# Patient Record
Sex: Female | Born: 1960 | Race: Black or African American | Hispanic: No | Smoking: Current every day smoker
Health system: Southern US, Community
[De-identification: ages and names within clinical notes are randomized; demographics above are authoritative.]

## PROBLEM LIST (undated history)

## (undated) DIAGNOSIS — I10 Essential (primary) hypertension: Secondary | ICD-10-CM

## (undated) DIAGNOSIS — R569 Unspecified convulsions: Secondary | ICD-10-CM

## (undated) DIAGNOSIS — E119 Type 2 diabetes mellitus without complications: Secondary | ICD-10-CM

## (undated) HISTORY — PX: APPENDECTOMY: SHX54

## (undated) HISTORY — DX: Unspecified convulsions: R56.9

## (undated) HISTORY — PX: ABDOMINAL HYSTERECTOMY: SHX81

## (undated) HISTORY — PX: CHOLECYSTECTOMY: SHX55

## (undated) HISTORY — DX: Essential (primary) hypertension: I10

## (undated) HISTORY — DX: Type 2 diabetes mellitus without complications: E11.9

---

## 1998-09-19 ENCOUNTER — Emergency Department (HOSPITAL_COMMUNITY): Admission: EM | Admit: 1998-09-19 | Discharge: 1998-09-19 | Payer: Self-pay | Admitting: Emergency Medicine

## 2012-07-27 ENCOUNTER — Other Ambulatory Visit: Payer: Self-pay

## 2012-07-27 MED ORDER — AMLODIPINE BESYLATE 5 MG PO TABS: 5.0000 mg | ORAL_TABLET | Freq: Every day | ORAL | Status: DC

## 2012-07-27 NOTE — Telephone Encounter (Signed)
Last seen 5/13

## 2012-07-29 ENCOUNTER — Telehealth: Payer: Self-pay | Admitting: Nurse Practitioner

## 2012-07-29 NOTE — Telephone Encounter (Signed)
PT AWARE RX CALLED IN

## 2012-08-30 ENCOUNTER — Other Ambulatory Visit: Payer: Self-pay | Admitting: *Deleted

## 2012-08-30 MED ORDER — AMLODIPINE BESYLATE 5 MG PO TABS: 5.0000 mg | ORAL_TABLET | Freq: Every day | ORAL | Status: DC

## 2012-08-30 NOTE — Telephone Encounter (Signed)
LAST OV 5/13. NTBS

## 2012-09-02 ENCOUNTER — Other Ambulatory Visit: Payer: Self-pay | Admitting: Nurse Practitioner

## 2012-09-22 ENCOUNTER — Telehealth: Payer: Self-pay | Admitting: *Deleted

## 2012-09-22 ENCOUNTER — Ambulatory Visit (INDEPENDENT_AMBULATORY_CARE_PROVIDER_SITE_OTHER): Payer: BC Managed Care – PPO | Admitting: Nurse Practitioner

## 2012-09-22 ENCOUNTER — Encounter: Payer: Self-pay | Admitting: Nurse Practitioner

## 2012-09-22 VITALS — BP 148/94 | HR 82 | Temp 98.0°F | Ht 63.0 in | Wt 199.0 lb

## 2012-09-22 DIAGNOSIS — I10 Essential (primary) hypertension: Secondary | ICD-10-CM

## 2012-09-22 DIAGNOSIS — B372 Candidiasis of skin and nail: Secondary | ICD-10-CM

## 2012-09-22 DIAGNOSIS — R569 Unspecified convulsions: Secondary | ICD-10-CM

## 2012-09-22 LAB — COMPLETE METABOLIC PANEL WITH GFR
ALT: 8 U/L (ref 0–35)
BUN: 15 mg/dL (ref 6–23)
CO2: 25 mEq/L (ref 19–32)
Calcium: 9 mg/dL (ref 8.4–10.5)
Chloride: 105 mEq/L (ref 96–112)
Creat: 0.77 mg/dL (ref 0.50–1.10)
GFR, Est African American: >89 mL/min

## 2012-09-22 MED ORDER — AMLODIPINE BESYLATE 5 MG PO TABS: 5.0000 mg | ORAL_TABLET | Freq: Every day | ORAL | Status: DC

## 2012-09-22 MED ORDER — MICONAZOLE NITRATE 2 % VA CREA: 1.0000 | TOPICAL_CREAM | Freq: Every day | VAGINAL | Status: DC

## 2012-09-22 NOTE — Patient Instructions (Signed)
Yeast Infection of the Skin Some yeast on the skin is normal, but sometimes it causes an infection. If you have a yeast infection, it shows up as white or light brown patches on brown skin. You can see it better in the summer on tan skin. It causes light-colored holes in your suntan. It can happen on any area of the body. This cannot be passed from person to person. HOME CARE  Scrub your skin daily with a dandruff shampoo. Your rash may take a couple weeks to get well.  Do not scratch or itch the rash. GET HELP RIGHT AWAY IF:   You get another infection from scratching. The skin may get warm, red, and may ooze fluid.  The infection does not seem to be getting better. MAKE SURE YOU:  Understand these instructions.  Will watch your condition.  Will get help right away if you are not doing well or get worse. Document Released: 02/21/2008 Document Revised: 06/02/2011 Document Reviewed: 02/21/2008 Exit[employer] Patient Information 2014 [employer].  

## 2012-09-22 NOTE — Telephone Encounter (Signed)
Pt not answering phone. Called to [facility] vm,  Monistat 7, 2% vaginal cream, place applicator full vaginally at bedtime for 7 nights.

## 2012-09-22 NOTE — Progress Notes (Signed)
  Subjective:    Patient ID: Melanie Maynard, female    DOB: 08/16/1960, 52 y.o.   MRN: [id number]  Hypertension This is a chronic problem. The current episode started more than 1 year ago. The problem has been waxing and waning (hasn't taken blood pressure meds today) since onset. The problem is uncontrolled. Pertinent negatives include no blurred vision, chest pain, headaches, malaise/fatigue, orthopnea, palpitations, peripheral edema, PND, shortness of breath or sweats. There are no associated agents to hypertension. Risk factors for coronary artery disease include obesity and post-menopausal state. Past treatments include calcium channel blockers. The current treatment provides moderate improvement. Compliance problems include diet and exercise.    Seizure disorder Sees Dr. Darrol Angel- Only goes 1X per year-Last seizure was at least 12 years ago.- Doing well on meds without side effects   Review of Systems  Constitutional: Negative for malaise/fatigue.  Eyes: Negative for blurred vision.  Respiratory: Negative for shortness of breath.   Cardiovascular: Negative for chest pain, palpitations, orthopnea and PND.  Neurological: Negative for headaches.  All other systems reviewed and are negative.       Objective:   Physical Exam  Constitutional: She is oriented to person, place, and time. She appears well-developed and well-nourished.  HENT:  Nose: Nose normal.  Mouth/Throat: Oropharynx is clear and moist.  Eyes: EOM are normal.  Neck: Trachea normal, normal range of motion and full passive range of motion without pain. Neck supple. No JVD present. Carotid bruit is not present. No thyromegaly present.  Cardiovascular: Normal rate, regular rhythm, normal heart sounds and intact distal pulses.  Exam reveals no gallop and no friction rub.   No murmur heard. Pulmonary/Chest: Effort normal and breath sounds normal.  Abdominal: Soft. Bowel sounds are normal. She exhibits no distension and no  mass. There is no tenderness.  Musculoskeletal: Normal range of motion.  Lymphadenopathy:    She has no cervical adenopathy.  Neurological: She is alert and oriented to person, place, and time. She has normal reflexes.  Skin: Skin is warm and dry.  Psychiatric: She has a normal mood and affect. Her behavior is normal. Judgment and thought content normal.   BP 148/94  Pulse 82  Temp(Src) 98 F (36.7 C) (Oral)  Ht 5\' 3"  (1.6 m)  Wt 199 lb (90.266 kg)  BMI 35.26 kg/m2        Assessment & Plan:   1. Hypertension   2. Seizures   3. Cutaneous candidiasis    Orders Placed This Encounter  Procedures  . COMPLETE METABOLIC PANEL WITH GFR  . NMR Lipoprofile with Lipids   Meds ordered this encounter  Medications  . phenytoin (DILANTIN) 100 MG ER capsule    Sig: Take by mouth 3 (three) times daily.  Marland Kitchen amLODipine (NORVASC) 5 MG tablet    Sig: Take 1 tablet (5 mg total) by mouth daily.    Dispense:  30 tablet    Refill:  5  . miconazole (MONISTAT 7) 2 % vaginal cream    Sig: Place 7 Applicatorfuls vaginally at bedtime.    Dispense:  45 g    Refill:  1    Order Specific Question:  Supervising Provider    Answer:  Deborra Medina   Continue current meds Labs pending Diet and exercise encouraged  Follow up in 6 months  Mary-Margaret Daphine Deutscher, FNP

## 2012-09-23 LAB — NMR LIPOPROFILE WITH LIPIDS
HDL Particle Number: 36.1 umol/L (ref >=30.5)
LDL (calc): 151 mg/dL — ABNORMAL HIGH (ref <100)
LDL Size: 20.4 nm — ABNORMAL LOW (ref >20.5)
LP-IR Score: 52 — ABNORMAL HIGH (ref <=45)
Large VLDL-P: 1.9 nmol/L (ref <=2.7)
Small LDL Particle Number: 1048 nmol/L — ABNORMAL HIGH (ref <=527)

## 2012-09-30 ENCOUNTER — Telehealth: Payer: Self-pay | Admitting: Nurse Practitioner

## 2012-09-30 MED ORDER — ATORVASTATIN CALCIUM 40 MG PO TABS: 40.0000 mg | ORAL_TABLET | Freq: Every day | ORAL | Status: DC

## 2012-09-30 NOTE — Telephone Encounter (Signed)
lipitor rx sent to pharmacy 

## 2012-10-05 ENCOUNTER — Encounter: Payer: Self-pay | Admitting: Nurse Practitioner

## 2012-10-05 ENCOUNTER — Other Ambulatory Visit: Payer: Self-pay

## 2012-10-05 ENCOUNTER — Ambulatory Visit (INDEPENDENT_AMBULATORY_CARE_PROVIDER_SITE_OTHER): Payer: BC Managed Care – PPO | Admitting: Nurse Practitioner

## 2012-10-05 VITALS — BP 140/80 | HR 84 | Ht 65.0 in | Wt 202.0 lb

## 2012-10-05 DIAGNOSIS — R569 Unspecified convulsions: Secondary | ICD-10-CM

## 2012-10-05 DIAGNOSIS — Z79899 Other long term (current) drug therapy: Secondary | ICD-10-CM

## 2012-10-05 MED ORDER — PHENYTOIN SODIUM EXTENDED 100 MG PO CAPS: 300.0000 mg | ORAL_CAPSULE | Freq: Every day | ORAL | Status: DC

## 2012-10-05 NOTE — Progress Notes (Signed)
I have read the note, and I agree with the clinical assessment and plan.  

## 2012-10-05 NOTE — Progress Notes (Signed)
HPI: Melanie Maynard, 52 year old black female returns for followup. She was last seen 10/09/2011. She has a history of seizure disorder, last seizure was approximately 18years ago. She is currently on Dilantin without side effects, however did run out of her prescription 2 days ago. She denies daytime drowsiness or feelings of being off balance. She has had no evidence of bowel or bladder incontinence, no tongue biting, no jerking of extremities. Sleeping well, appetite is reportedly good, has chronic sinus issues with cough. Medical history also includes  hypertension and elevated cholesterol. No new neurologic complaints.   ROS: Negative   Physical Exam General: well developed, well nourished, seated, in no evident distress Head: head normocephalic and atraumatic. Oropharynx benign Neck: supple with no carotid  bruits Cardiovascular: regular rate and rhythm, no murmurs  Neurologic Exam Mental Status: Awake and fully alert. Oriented to place and time. Follows all commands. Speech and language normal.   Cranial Nerves: Pupils equal, briskly reactive to light. Extraocular movements full without nystagmus. Visual fields full to confrontation. Hearing intact and symmetric to finger snap. Facial sensation intact. Face, tongue, palate move normally and symmetrically. Neck flexion and extension normal.  Motor: Normal bulk and tone. Normal strength in all tested extremity muscles.No focal weakness Coordination: Rapid alternating movements normal in all extremities. Finger-to-nose and heel-to-shin performed accurately bilaterally. No dysmetria Gait and Station: Arises from chair without difficulty. Stance is normal. Gait demonstrates normal stride length and balance . Able to heel, toe and tandem walk without difficulty.  Reflexes: 1+ and symmetric. Toes downgoing.     ASSESSMENT: Seizure disorder well controlled on Dilantin     PLAN: Pt will continue Dilantin at current dose will refill Will obtain  Dilantin  level next week patient has not taken medication for 2 days due to being out Review of labs from her primary care provider Will also obtain CBC Followup yearly and when necessary  Nilda Riggs, GNP-BC APRN

## 2012-10-05 NOTE — Patient Instructions (Addendum)
Pt will continue Dilantin at current dose will refill Will obtain Dilantin  level next week patient has not taken medication for 2 days due to being out Review of labs from her primary care provider Will also obtain CBC Followup yearly and when necessary

## 2012-10-14 ENCOUNTER — Other Ambulatory Visit (INDEPENDENT_AMBULATORY_CARE_PROVIDER_SITE_OTHER): Payer: BC Managed Care – PPO

## 2012-10-14 DIAGNOSIS — Z79899 Other long term (current) drug therapy: Secondary | ICD-10-CM

## 2012-10-14 DIAGNOSIS — R569 Unspecified convulsions: Secondary | ICD-10-CM

## 2012-10-18 LAB — CBC WITH DIFFERENTIAL/PLATELET
Basophils Absolute: 0 10*3/uL (ref 0.0–0.2)
Eosinophils Absolute: 0.4 10*3/uL (ref 0.0–0.4)
Immature Grans (Abs): 0 10*3/uL (ref 0.0–0.1)
Immature Granulocytes: 0 % (ref 0–2)
MCH: 26.8 pg (ref 26.6–33.0)
MCHC: 32.2 g/dL (ref 31.5–35.7)
MCV: 83 fL (ref 79–97)
Monocytes Absolute: 0.5 10*3/uL (ref 0.1–0.9)
Neutrophils Relative %: 58 % (ref 40–74)
RDW: 14.3 % (ref 12.3–15.4)

## 2012-10-20 NOTE — Telephone Encounter (Signed)
No response from pt.

## 2012-10-21 ENCOUNTER — Telehealth: Payer: Self-pay | Admitting: Neurology

## 2012-10-21 NOTE — Telephone Encounter (Signed)
Message copied by Stephanie Acre on Thu Oct 21, 2012  2:13 PM ------      Message from: [place], [place] T      Created: Thu Oct 21, 2012  1:02 PM       Dr. Anne Hahn can you look at these labs since Eber Jones is away, and put in result note.  I will call the patient.  Thanks Lupita Leash            ----- Message -----         From: [facility] Lab Results In Interface         Sent: 10/18/2012   1:35 PM           To: Nilda Riggs, NP                   ------

## 2012-10-21 NOTE — Telephone Encounter (Signed)
Called patient. A Dilantin level is low, but he tends to run low, and her seizures are under good control. I will not readjust the medication dosing. The white blood count is slightly elevated, but this also appears to be a chronic issue for her. White blood count was 13, last white count we have in our records is 22.

## 2013-01-05 ENCOUNTER — Telehealth: Payer: Self-pay | Admitting: Neurology

## 2013-01-05 NOTE — Telephone Encounter (Signed)
[facility] pharmacy in [place] left message that they are changing manufacturers for Dilantin, and since it a drug with a narrow therapeutic index they are required to notify the prescriber.

## 2013-01-06 NOTE — Telephone Encounter (Signed)
OK so please ask patient to call us if she has any problems.

## 2013-01-07 NOTE — Telephone Encounter (Signed)
Spoke to patient and told her to let us know if she has any problems due to different manufacturer of Dilantin.  She agreed.

## 2013-03-28 ENCOUNTER — Ambulatory Visit: Payer: BC Managed Care – PPO | Admitting: Nurse Practitioner

## 2013-04-13 ENCOUNTER — Ambulatory Visit (INDEPENDENT_AMBULATORY_CARE_PROVIDER_SITE_OTHER): Payer: BC Managed Care – PPO | Admitting: Orthopedic Surgery

## 2013-04-13 ENCOUNTER — Ambulatory Visit (INDEPENDENT_AMBULATORY_CARE_PROVIDER_SITE_OTHER): Payer: BC Managed Care – PPO

## 2013-04-13 ENCOUNTER — Encounter: Payer: Self-pay | Admitting: Orthopedic Surgery

## 2013-04-13 VITALS — BP 146/86 | Ht 65.0 in | Wt 200.0 lb

## 2013-04-13 DIAGNOSIS — M1710 Unilateral primary osteoarthritis, unspecified knee: Secondary | ICD-10-CM

## 2013-04-13 DIAGNOSIS — IMO0002 Reserved for concepts with insufficient information to code with codable children: Secondary | ICD-10-CM

## 2013-04-13 DIAGNOSIS — M25569 Pain in unspecified knee: Secondary | ICD-10-CM

## 2013-04-13 DIAGNOSIS — M25561 Pain in right knee: Secondary | ICD-10-CM

## 2013-04-13 DIAGNOSIS — M792 Neuralgia and neuritis, unspecified: Secondary | ICD-10-CM | POA: Insufficient documentation

## 2013-04-13 DIAGNOSIS — G5790 Unspecified mononeuropathy of unspecified lower limb: Secondary | ICD-10-CM

## 2013-04-13 DIAGNOSIS — M23329 Other meniscus derangements, posterior horn of medial meniscus, unspecified knee: Secondary | ICD-10-CM

## 2013-04-13 DIAGNOSIS — M1711 Unilateral primary osteoarthritis, right knee: Secondary | ICD-10-CM

## 2013-04-13 MED ORDER — IBUPROFEN 800 MG PO TABS: 800.0000 mg | ORAL_TABLET | Freq: Three times a day (TID) | ORAL | Status: DC | PRN

## 2013-04-13 NOTE — Patient Instructions (Addendum)
Encounter Diagnoses  Name Primary?  . Knee pain, right Yes  . Medial meniscus, posterior horn derangement   . Neuralgia of right lower extremity   . Arthritis of knee, right     You have received a steroid shot. 15% of patients experience increased pain at the injection site with in the next 24 hours. This is best treated with ice and tylenol extra strength 2 tabs every 8 hours. If you are still having pain please call the office.   Ask your doctor about your vitamin D levels and calcium as it relates to dilantin  Call our office to schedule a new appointment  if no improvement after 4 weeks   Your prescription for ibuprofen is at the pharmacy you listed

## 2013-04-13 NOTE — Progress Notes (Signed)
Patient ID: Melanie Maynard, female   DOB: Aug 20, 1960, 53 y.o.   MRN: [id number] Chief Complaint  Patient presents with  . Knee Pain    Right knee pain, no xray.    HISTORY: This is a 53 year old female who presents with the complaints of gradual onset of throbbing 9/10 intermittent right knee pain with no provoking or aggravating factors noting that it is worse in the morning early and late at night when she's sitting or lying down. She also has some lateral leg numbness. Other symptoms include swelling of the knee she denies catching locking or giving way she denies any back pain  Her review of systems is noted for seizures otherwise normal  She has no allergies  She has a seizure history and hypertension  She's had a partial hysterectomy cholecystectomy and an appendectomy  She takes phenytoin 100 mg 3 capsules at night and amlodipine 5 mg daily  Family history cancer diabetes  Social history single working smoker nonalcohol user and no drug use  Her vital signs are stable BP 146/86  Ht 5\' 5"  (1.651 m)  Wt 200 lb (90.719 kg)  BMI 33.28 kg/m2  She is oriented x3 her mood is normal she walks without support  She has medial joint line tenderness in the posterior medial corner she also has tenderness around the fibular head tenderness in the anterior compartment of the lower leg. She has full passive active range of motion in her knee is stable. Her muscle tone is normal she has good quadriceps strength and tone. Skin is intact. She has good distal pulse and normal sensation  The left knee looks normal fields normal feels stable has no skin lesions has full range of motion  X-rays show mild arthritis  Her diagnosis is Encounter Diagnoses  Name Primary?  . Knee pain, right Yes  . Medial meniscus, posterior horn derangement   . Neuralgia of right lower extremity   . Arthritis of knee, right     I gave her an injection started her on ibuprofen for the medial meniscus neuralgia  and the arthritis. I did not start gabapentin secondary to the phenytoin.  If she does not get better over the next 4 weeks she will call us for an appointment

## 2013-04-19 ENCOUNTER — Ambulatory Visit (INDEPENDENT_AMBULATORY_CARE_PROVIDER_SITE_OTHER): Payer: BC Managed Care – PPO | Admitting: Nurse Practitioner

## 2013-04-19 ENCOUNTER — Encounter: Payer: Self-pay | Admitting: Nurse Practitioner

## 2013-04-19 VITALS — BP 141/98 | HR 79 | Temp 98.8°F | Ht 65.0 in | Wt 200.0 lb

## 2013-04-19 DIAGNOSIS — Z0000 Encounter for general adult medical examination without abnormal findings: Secondary | ICD-10-CM

## 2013-04-19 DIAGNOSIS — Z01419 Encounter for gynecological examination (general) (routine) without abnormal findings: Secondary | ICD-10-CM

## 2013-04-19 DIAGNOSIS — R569 Unspecified convulsions: Secondary | ICD-10-CM

## 2013-04-19 DIAGNOSIS — I10 Essential (primary) hypertension: Secondary | ICD-10-CM

## 2013-04-19 DIAGNOSIS — Z124 Encounter for screening for malignant neoplasm of cervix: Secondary | ICD-10-CM

## 2013-04-19 LAB — POCT UA - MICROSCOPIC ONLY
Casts, Ur, LPF, POC: NEGATIVE
Crystals, Ur, HPF, POC: NEGATIVE

## 2013-04-19 LAB — POCT CBC
Granulocyte percent: 59.4 %G (ref 37–80)
HCT, POC: 44.1 % (ref 37.7–47.9)
HEMOGLOBIN: 14.1 g/dL (ref 12.2–16.2)
LYMPH, POC: 4.2 — AB (ref 0.6–3.4)
MCH: 25.7 pg — AB (ref 27–31.2)
MCHC: 32.1 g/dL (ref 31.8–35.4)
MCV: 80 fL (ref 80–97)
MPV: 9.7 fL (ref 0–99.8)
PLATELET COUNT, POC: 232 10*3/uL (ref 142–424)
POC GRANULOCYTE: 7.2 — AB (ref 2–6.9)
POC LYMPH %: 34.9 % (ref 10–50)
RBC: 5.5 M/uL — AB (ref 4.04–5.48)
RDW, POC: 14 %
WBC: 12.1 10*3/uL — AB (ref 4.6–10.2)

## 2013-04-19 LAB — POCT URINALYSIS DIPSTICK
BILIRUBIN UA: NEGATIVE
GLUCOSE UA: NEGATIVE
KETONES UA: NEGATIVE
LEUKOCYTES UA: NEGATIVE
NITRITE UA: POSITIVE
PH UA: 6
Spec Grav, UA: 1.025
Urobilinogen, UA: NEGATIVE

## 2013-04-19 MED ORDER — AMLODIPINE BESYLATE 5 MG PO TABS: 5.0000 mg | ORAL_TABLET | Freq: Every day | ORAL | Status: DC

## 2013-04-19 NOTE — Progress Notes (Signed)
   Subjective:    Patient ID: JERLYN PAIN, female    DOB: 1960-12-12, 53 y.o.   MRN: [id number]  HPI Patient in today for annual physical and PAP- SHe is doing weel without complaints today. seizures Has had for over 20 years- currently on dilantin- last seizure was over 13 years ago- sees neurologist 1X per year hypertension Currently on norvasc which works well for her- no side effects- denies headaches and dizziness Review of Systems  Constitutional: Negative.   HENT: Negative.   Respiratory: Negative.   Gastrointestinal: Negative.   Genitourinary: Negative.   Neurological: Negative.   Psychiatric/Behavioral: Negative.   All other systems reviewed and are negative.       Objective:   Physical Exam  Constitutional: She is oriented to person, place, and time. She appears well-developed and well-nourished.  HENT:  Head: Normocephalic.  Right Ear: Hearing, tympanic membrane, external ear and ear canal normal.  Left Ear: Hearing, tympanic membrane, external ear and ear canal normal.  Nose: Nose normal.  Mouth/Throat: Uvula is midline and oropharynx is clear and moist.  Eyes: Conjunctivae and EOM are normal. Pupils are equal, round, and reactive to light.  Neck: Normal range of motion and full passive range of motion without pain. Neck supple. No JVD present. Carotid bruit is not present. No mass and no thyromegaly present.  Cardiovascular: Normal rate, normal heart sounds and intact distal pulses.   No murmur heard. Pulmonary/Chest: Effort normal and breath sounds normal. Right breast exhibits no inverted nipple, no mass, no nipple discharge, no skin change and no tenderness. Left breast exhibits no inverted nipple, no mass, no nipple discharge, no skin change and no tenderness.  Abdominal: Soft. Bowel sounds are normal. She exhibits no mass. There is no tenderness.  Genitourinary: Vagina normal and uterus normal. No breast swelling, tenderness, discharge or bleeding.  bimanual  exam-No adnexal masses or tenderness. Vaginal cuff intact  Musculoskeletal: Normal range of motion.  Lymphadenopathy:    She has no cervical adenopathy.  Neurological: She is alert and oriented to person, place, and time.  Skin: Skin is warm and dry.  Psychiatric: She has a normal mood and affect. Her behavior is normal. Judgment and thought content normal.    BP 141/98  Pulse 79  Temp(Src) 98.8 F (37.1 C) (Oral)  Ht $R'5\' 5"'TK$  (1.651 m)  Wt 200 lb (90.719 kg)  BMI 33.28 kg/m2       Assessment & Plan:   1. Encounter for routine gynecological examination   2. Hypertension   3. Convulsions/seizures   4. Annual physical exam    Orders Placed This Encounter  Procedures  . CMP14+EGFR  . NMR, lipoprofile  . Thyroid Panel With TSH  . POCT UA - Microscopic Only  . POCT urinalysis dipstick  . POCT CBC   Meds ordered this encounter  Medications  . amLODipine (NORVASC) 5 MG tablet    Sig: Take 1 tablet (5 mg total) by mouth daily.    Dispense:  30 tablet    Refill:  5    Order Specific Question:  Supervising Provider    Answer:  Chipper Herb [[id number]]    Labs pending Health maintenance reviewed Diet and exercise encouraged Continue all meds Follow up  In 6 months   [place], FNP

## 2013-04-19 NOTE — Patient Instructions (Signed)

## 2013-04-20 LAB — NMR, LIPOPROFILE
CHOLESTEROL: 212 mg/dL — AB (ref <200)
HDL CHOLESTEROL BY NMR: 51 mg/dL (ref >=40)
HDL Particle Number: 33.8 umol/L (ref >=30.5)
LDL PARTICLE NUMBER: 2249 nmol/L — AB (ref <1000)
LDL Size: 20.5 nm — ABNORMAL LOW (ref >20.5)
LDLC SERPL CALC-MCNC: 133 mg/dL — ABNORMAL HIGH (ref <100)
LP-IR Score: 74 — ABNORMAL HIGH (ref <=45)
Small LDL Particle Number: 1215 nmol/L — ABNORMAL HIGH (ref <=527)
TRIGLYCERIDES BY NMR: 140 mg/dL (ref <150)

## 2013-04-20 LAB — THYROID PANEL WITH TSH
FREE THYROXINE INDEX: 1.7 (ref 1.2–4.9)
T3 UPTAKE RATIO: 26 % (ref 24–39)
T4, Total: 6.5 ug/dL (ref 4.5–12.0)
TSH: 1.03 u[IU]/mL (ref 0.450–4.500)

## 2013-04-20 LAB — PAP IG W/ RFLX HPV ASCU: PAP Smear Comment: 0

## 2013-04-20 LAB — CMP14+EGFR
ALBUMIN: 4.1 g/dL (ref 3.5–5.5)
ALT: 8 IU/L (ref 0–32)
AST: 12 IU/L (ref 0–40)
Albumin/Globulin Ratio: 1.4 (ref 1.1–2.5)
Alkaline Phosphatase: 188 IU/L — ABNORMAL HIGH (ref 39–117)
BILIRUBIN TOTAL: 0.3 mg/dL (ref 0.0–1.2)
BUN/Creatinine Ratio: 22 (ref 9–23)
BUN: 17 mg/dL (ref 6–24)
CHLORIDE: 102 mmol/L (ref 97–108)
CO2: 24 mmol/L (ref 18–29)
Calcium: 9.1 mg/dL (ref 8.7–10.2)
Creatinine, Ser: 0.79 mg/dL (ref 0.57–1.00)
GFR calc non Af Amer: 86 mL/min/{1.73_m2} (ref >59)
GFR, EST AFRICAN AMERICAN: 100 mL/min/{1.73_m2} (ref >59)
Globulin, Total: 2.9 g/dL (ref 1.5–4.5)
Glucose: 97 mg/dL (ref 65–99)
Potassium: 4.6 mmol/L (ref 3.5–5.2)
Sodium: 139 mmol/L (ref 134–144)
TOTAL PROTEIN: 7 g/dL (ref 6.0–8.5)

## 2013-04-21 ENCOUNTER — Other Ambulatory Visit: Payer: Self-pay | Admitting: Nurse Practitioner

## 2013-04-21 MED ORDER — ATORVASTATIN CALCIUM 40 MG PO TABS: 40.0000 mg | ORAL_TABLET | Freq: Every day | ORAL | Status: DC

## 2013-04-21 MED ORDER — NITROFURANTOIN MONOHYD MACRO 100 MG PO CAPS: 100.0000 mg | ORAL_CAPSULE | Freq: Two times a day (BID) | ORAL | Status: DC

## 2013-04-22 ENCOUNTER — Telehealth: Payer: Self-pay | Admitting: Family Medicine

## 2013-04-22 NOTE — Telephone Encounter (Signed)
Message copied by Waverly Ferrari on Fri Apr 22, 2013 10:09 AM ------      Message from: Chevis Pretty      Created: Thu Apr 21, 2013  8:00 AM       Wbc elevated - ua showed UTI- probably why wbc elevated- rx sent to pharmacy to treat      CMP- normal      LDL particle numbers extremely elevated- need to be on a statin- lipitor rx sent to pharamacy- low fat diet and exercise and recheck in 3 months      PAP normal repeat in 2 years ------

## 2013-05-02 ENCOUNTER — Other Ambulatory Visit: Payer: Self-pay | Admitting: Neurology

## 2013-05-12 NOTE — Telephone Encounter (Signed)
Patient aware.

## 2013-10-07 ENCOUNTER — Encounter: Payer: Self-pay | Admitting: Nurse Practitioner

## 2013-10-07 ENCOUNTER — Ambulatory Visit (INDEPENDENT_AMBULATORY_CARE_PROVIDER_SITE_OTHER): Payer: BC Managed Care – PPO | Admitting: Nurse Practitioner

## 2013-10-07 VITALS — BP 158/95 | HR 70 | Ht 65.0 in | Wt 200.8 lb

## 2013-10-07 DIAGNOSIS — Z5181 Encounter for therapeutic drug level monitoring: Secondary | ICD-10-CM

## 2013-10-07 DIAGNOSIS — R569 Unspecified convulsions: Secondary | ICD-10-CM

## 2013-10-07 LAB — COMPREHENSIVE METABOLIC PANEL
ALK PHOS: 203 IU/L — AB (ref 39–117)
ALT: 9 IU/L (ref 0–32)
AST: 15 IU/L (ref 0–40)
Albumin/Globulin Ratio: 1.3 (ref 1.1–2.5)
Albumin: 4 g/dL (ref 3.5–5.5)
BILIRUBIN TOTAL: 0.2 mg/dL (ref 0.0–1.2)
BUN / CREAT RATIO: 20 (ref 9–23)
BUN: 16 mg/dL (ref 6–24)
CHLORIDE: 104 mmol/L (ref 96–108)
CO2: 23 mmol/L (ref 18–29)
Calcium: 9 mg/dL (ref 8.7–10.2)
Creatinine, Ser: 0.79 mg/dL (ref 0.57–1.00)
GFR calc Af Amer: 99 mL/min/{1.73_m2} (ref >59)
GFR calc non Af Amer: 86 mL/min/{1.73_m2} (ref >59)
Globulin, Total: 3 g/dL (ref 1.5–4.5)
Glucose: 74 mg/dL (ref 65–99)
Potassium: 4.5 mmol/L (ref 3.5–5.2)
SODIUM: 138 mmol/L (ref 134–144)
Total Protein: 7 g/dL (ref 6.0–8.5)

## 2013-10-07 LAB — CBC WITH DIFFERENTIAL/PLATELET
Basophils Absolute: 0 10*3/uL (ref 0.0–0.2)
Basos: 0 %
EOS ABS: 0.4 10*3/uL (ref 0.0–0.4)
Eos: 4 %
HEMATOCRIT: 42 % (ref 34.0–46.6)
HEMOGLOBIN: 14.6 g/dL (ref 11.1–15.9)
LYMPHS ABS: 4.8 10*3/uL — AB (ref 0.7–3.1)
Lymphs: 41 %
MCH: 27 pg (ref 26.6–33.0)
MCHC: 34.8 g/dL (ref 31.5–35.7)
MCV: 78 fL — AB (ref 79–97)
MONOS ABS: 0.6 10*3/uL (ref 0.1–0.9)
Monocytes: 6 %
NEUTROS ABS: 5.8 10*3/uL (ref 1.4–7.0)
Neutrophils Relative %: 49 %
RBC: 5.41 x10E6/uL — AB (ref 3.77–5.28)
RDW: 15 % (ref 12.3–15.4)
WBC: 11.7 10*3/uL — ABNORMAL HIGH (ref 3.4–10.8)

## 2013-10-07 LAB — PHENYTOIN LEVEL, TOTAL: PHENYTOIN LVL: 4.3 ug/mL — AB (ref 10.0–20.0)

## 2013-10-07 MED ORDER — PHENYTOIN SODIUM EXTENDED 100 MG PO CAPS: 300.0000 mg | ORAL_CAPSULE | Freq: Every day | ORAL | Status: DC

## 2013-10-07 NOTE — Patient Instructions (Signed)
Continue Dilantin at current dose Will obtain labs today F/U yearly

## 2013-10-07 NOTE — Progress Notes (Signed)
[facility]  PATIENT: Melanie Maynard DOB: [birth year]   REASON FOR VISIT: Follow up for seizure disorder   HISTORY OF PRESENT ILLNESS:Ms. Melanie Maynard, 53 year old black female returns for followup. She was last seen 10/05/12. She has a history of seizure disorder, last seizure was approximately 19 years ago. She is currently on Dilantin without side effects. She denies daytime drowsiness or feelings of being off balance. She has had no evidence of bowel or bladder incontinence, no tongue biting, no jerking of extremities. Sleeping well, appetite is reportedly good. Medical history also includes hypertension and elevated cholesterol.  She returns for reevaluation   REVIEW OF SYSTEMS: Full 14 system review of systems performed and notable only for those listed, all others are neg:  Constitutional: N/A  Cardiovascular: N/A  Ear/Nose/Throat: N/A  Skin: N/A  Eyes: N/A  Respiratory: N/A  Gastroitestinal: N/A  Hematology/Lymphatic: N/A  Endocrine: N/A Musculoskeletal:N/A  Allergy/Immunology: N/A  Neurological: N/A Psychiatric: N/A Sleep : NA   ALLERGIES: No Known Allergies  HOME MEDICATIONS: Outpatient Prescriptions Prior to Visit  Medication Sig Dispense Refill  . amLODipine (NORVASC) 5 MG tablet Take 1 tablet (5 mg total) by mouth daily.  30 tablet  5  . phenytoin (DILANTIN) 100 MG ER capsule TAKE THREE CAPSULES BY MOUTH AT BEDTIME  90 capsule  5  . atorvastatin (LIPITOR) 40 MG tablet Take 1 tablet (40 mg total) by mouth daily.  90 tablet  1  . ibuprofen (ADVIL,MOTRIN) 800 MG tablet Take 1 tablet (800 mg total) by mouth every 8 (eight) hours as needed.  90 tablet  0  . nitrofurantoin, macrocrystal-monohydrate, (MACROBID) 100 MG capsule Take 1 capsule (100 mg total) by mouth 2 (two) times daily.  14 capsule  0  . phenytoin (DILANTIN) 100 MG ER capsule Take 3 capsules (300 mg total) by mouth at bedtime.  90 capsule  11   No facility-administered medications prior to  visit.    PAST MEDICAL HISTORY: Past Medical History  Diagnosis Date  . Hypertension   . Seizures     PAST SURGICAL HISTORY: Past Surgical History  Procedure Laterality Date  . Abdominal hysterectomy    . Cholecystectomy    . Appendectomy      FAMILY HISTORY: Family History  Problem Relation Age of Onset  . Healthy Mother   . Healthy Father     SOCIAL HISTORY: History   Social History  . Marital Status: Single    Spouse Name: N/A    Number of Children: 0  . Years of Education: 12   Occupational History  .     Social History Main Topics  . Smoking status: Current Every Day Smoker  . Smokeless tobacco: Never Used  . Alcohol Use: No  . Drug Use: No  . Sexual Activity: Not on file   Other Topics Concern  . Not on file   Social History Narrative   Patient is single with no children   Patient is right handed   Patient has a high school education   Patient drinks 2-3 cups daily     PHYSICAL EXAM  Filed Vitals:   10/07/13 0910  BP: 158/95  Pulse: 70  Height: 5\' 5"  (1.651 m)  Weight: 200 lb 12.8 oz (91.082 kg)   Body mass index is 33.41 kg/(m^2). General: well developed, well nourished, seated, in no evident distress  Head: head normocephalic and atraumatic. Oropharynx benign  Neck: supple with no carotid bruits  Cardiovascular: regular rate and rhythm, no  murmurs  Neurologic Exam  Mental Status: Awake and fully alert. Oriented to place and time. Follows all commands. Speech and language normal.  Cranial Nerves: Pupils equal, briskly reactive to light. Extraocular movements full without nystagmus. Visual fields full to confrontation. Hearing intact and symmetric to finger snap. Facial sensation intact. Face, tongue, palate move normally and symmetrically. Neck flexion and extension normal.  Motor: Normal bulk and tone. Normal strength in all tested extremity muscles.No focal weakness  Coordination: Rapid alternating movements normal in all extremities.  Finger-to-nose and heel-to-shin performed accurately bilaterally. No dysmetria  Gait and Station: Arises from chair without difficulty. Stance is normal. Gait demonstrates normal stride length and balance . Able to heel, toe and tandem walk without difficulty.  Reflexes: 1+ and symmetric. Toes downgoing.     DIAGNOSTIC DATA (LABS, IMAGING, TESTING) - I reviewed patient records, labs, notes, testing and imaging myself where available.  Lab Results  Component Value Date   WBC 12.1* 04/19/2013   HGB 14.1 04/19/2013   HCT 44.1 04/19/2013   MCV 80.0 04/19/2013      Component Value Date/Time   NA 139 04/19/2013 1108   NA 137 09/22/2012 1213   K 4.6 04/19/2013 1108   CL 102 04/19/2013 1108   CO2 24 04/19/2013 1108   GLUCOSE 97 04/19/2013 1108   GLUCOSE 88 09/22/2012 1213   BUN 17 04/19/2013 1108   BUN 15 09/22/2012 1213   CREATININE 0.79 04/19/2013 1108   CREATININE 0.77 09/22/2012 1213   CALCIUM 9.1 04/19/2013 1108   PROT 7.0 04/19/2013 1108   PROT 6.8 09/22/2012 1213   ALBUMIN 3.9 09/22/2012 1213   AST 12 04/19/2013 1108   ALT 8 04/19/2013 1108   ALKPHOS 188* 04/19/2013 1108   BILITOT 0.3 04/19/2013 1108   GFRNONAA 86 04/19/2013 1108   GFRNONAA 89 09/22/2012 1213   GFRAA 100 04/19/2013 1108   GFRAA >89 09/22/2012 1213   Lab Results  Component Value Date   CHOL 212* 04/19/2013   HDL 51 04/19/2013   LDLCALC 133* 04/19/2013   TRIG 140 04/19/2013    Lab Results  Component Value Date   TSH 1.030 04/19/2013    ASSESSMENT AND PLAN  53 y.o. year old female  has a past medical history of Hypertension and Seizures. here in followup. Last seizure 19 years ago  Continue Dilantin at current dose, will refill Will obtain labs today F/U yearly Dennie Bible, [facility], [facility], [facility] [address], [place], [place] [address] [phone]

## 2013-10-09 NOTE — Progress Notes (Signed)
I have read the note, and I agree with the clinical assessment and plan.  Neleh Muldoon KEITH   

## 2013-10-11 ENCOUNTER — Encounter: Payer: Self-pay | Admitting: *Deleted

## 2013-10-11 NOTE — Progress Notes (Signed)
Quick Note:  Mailed copy of results to pt. ______

## 2013-10-17 ENCOUNTER — Encounter: Payer: Self-pay | Admitting: Nurse Practitioner

## 2013-10-17 ENCOUNTER — Ambulatory Visit (INDEPENDENT_AMBULATORY_CARE_PROVIDER_SITE_OTHER): Payer: BC Managed Care – PPO | Admitting: Nurse Practitioner

## 2013-10-17 VITALS — BP 161/124 | HR 75 | Temp 97.8°F | Ht 65.0 in | Wt 199.6 lb

## 2013-10-17 DIAGNOSIS — R569 Unspecified convulsions: Secondary | ICD-10-CM

## 2013-10-17 DIAGNOSIS — E785 Hyperlipidemia, unspecified: Secondary | ICD-10-CM

## 2013-10-17 DIAGNOSIS — Z6833 Body mass index (BMI) 33.0-33.9, adult: Secondary | ICD-10-CM

## 2013-10-17 DIAGNOSIS — I10 Essential (primary) hypertension: Secondary | ICD-10-CM

## 2013-10-17 DIAGNOSIS — Z713 Dietary counseling and surveillance: Secondary | ICD-10-CM

## 2013-10-17 MED ORDER — AMLODIPINE BESYLATE 5 MG PO TABS: 5.0000 mg | ORAL_TABLET | Freq: Every day | ORAL | Status: DC

## 2013-10-17 MED ORDER — ATORVASTATIN CALCIUM 40 MG PO TABS: 40.0000 mg | ORAL_TABLET | Freq: Every day | ORAL | Status: DC

## 2013-10-17 NOTE — Progress Notes (Signed)
Subjective:    Patient ID: Melanie Maynard, female    DOB: Mar 06, 1961, 53 y.o.   MRN: [id number]  Patient here today for follow up of chronic medical problems.  Hypertension This is a chronic problem. The current episode started more than 1 year ago. The problem is unchanged (Has not taken meds this morning.). The problem is controlled. Pertinent negatives include no chest pain or shortness of breath. Past treatments include calcium channel blockers. The current treatment provides mild improvement. Compliance problems include diet and exercise.   Hyperlipidemia This is a chronic problem. The current episode started more than 1 year ago. The problem is uncontrolled. Recent lipid tests were reviewed and are high. Exacerbating diseases include obesity. She has no history of diabetes or hypothyroidism. Factors aggravating her hyperlipidemia include smoking. Pertinent negatives include no chest pain, focal sensory loss, focal weakness, leg pain, myalgias or shortness of breath. Current antihyperlipidemic treatment includes statins (suppose to be on lipitor but has not been taking.). The current treatment provides mild improvement of lipids. Compliance problems include adherence to diet and adherence to exercise.  Risk factors for coronary artery disease include hypertension and obesity.  seizures Has had for over 20 years- currently on dilantin- last seizure was over 13 years ago- sees neurologist 1X per year  Review of Systems  Constitutional: Negative.   HENT: Negative.   Respiratory: Negative.  Negative for shortness of breath.   Cardiovascular: Negative for chest pain.  Gastrointestinal: Negative.   Genitourinary: Negative.   Musculoskeletal: Negative for myalgias.  Neurological: Negative.  Negative for focal weakness.  Psychiatric/Behavioral: Negative.   All other systems reviewed and are negative.      Objective:   Physical Exam  Constitutional: She is oriented to person, place, and  time. She appears well-developed and well-nourished.  HENT:  Head: Normocephalic.  Right Ear: Hearing, tympanic membrane, external ear and ear canal normal.  Left Ear: Hearing, tympanic membrane, external ear and ear canal normal.  Nose: Nose normal.  Mouth/Throat: Uvula is midline and oropharynx is clear and moist.  Eyes: Conjunctivae and EOM are normal. Pupils are equal, round, and reactive to light.  Neck: Normal range of motion and full passive range of motion without pain. Neck supple. No JVD present. Carotid bruit is not present. No mass and no thyromegaly present.  Cardiovascular: Normal rate, normal heart sounds and intact distal pulses.   No murmur heard. Pulmonary/Chest: Effort normal and breath sounds normal. Right breast exhibits no inverted nipple, no mass, no nipple discharge, no skin change and no tenderness. Left breast exhibits no inverted nipple, no mass, no nipple discharge, no skin change and no tenderness.  Abdominal: Soft. Bowel sounds are normal. She exhibits no mass. There is no tenderness.  Genitourinary: Vagina normal and uterus normal. No breast swelling, tenderness, discharge or bleeding.  bimanual exam-No adnexal masses or tenderness. Vaginal cuff intact  Musculoskeletal: Normal range of motion.  Lymphadenopathy:    She has no cervical adenopathy.  Neurological: She is alert and oriented to person, place, and time.  Skin: Skin is warm and dry.  Psychiatric: She has a normal mood and affect. Her behavior is normal. Judgment and thought content normal.    BP 161/124  Pulse 75  Temp(Src) 97.8 F (36.6 C) (Oral)  Ht 5\' 5"  (1.651 m)  Wt 199 lb 9.6 oz (90.538 kg)  BMI 33.22 kg/m2       Assessment & Plan:   1. Seizures   2. Essential hypertension  3. Hyperlipidemia with target LDL less than 100   4. BMI 33.0-33.9,adult   5. Weight loss counseling, encounter for    Orders Placed This Encounter  Procedures  . NMR, lipoprofile   Meds ordered this  encounter  Medications  . DISCONTD: amLODipine (NORVASC) 5 MG tablet    Sig: Take 1 tablet (5 mg total) by mouth daily.    Dispense:  30 tablet    Refill:  5    Order Specific Question:  Supervising Provider    Answer:  Chipper Herb [[id number]]  . atorvastatin (LIPITOR) 40 MG tablet    Sig: Take 1 tablet (40 mg total) by mouth daily.    Dispense:  90 tablet    Refill:  1    Order Specific Question:  Supervising Provider    Answer:  Chipper Herb [[id number]]  . amLODipine (NORVASC) 5 MG tablet    Sig: Take 1 tablet (5 mg total) by mouth daily.    Dispense:  90 tablet    Refill:  1    Disregard 30 day rx that was previously sent    Order Specific Question:  Supervising Provider    Answer:  Chipper Herb [[id number]]   Smoking cessation Labs pending Health maintenance reviewed Diet and exercise encouraged Continue all meds Follow up  In 3 months   [place], FNP

## 2013-10-17 NOTE — Patient Instructions (Signed)
Smoking Cessation Quitting smoking is important to your health and has many advantages. However, it is not always easy to quit since nicotine is a very addictive drug. Oftentimes, people try 3 times or more before being able to quit. This document explains the best ways for you to prepare to quit smoking. Quitting takes hard work and a lot of effort, but you can do it. ADVANTAGES OF QUITTING SMOKING  You will live longer, feel better, and live better.  Your body will feel the impact of quitting smoking almost immediately.  Within 20 minutes, blood pressure decreases. Your pulse returns to its normal level.  After 8 hours, carbon monoxide levels in the blood return to normal. Your oxygen level increases.  After 24 hours, the chance of having a heart attack starts to decrease. Your breath, hair, and body stop smelling like smoke.  After 48 hours, damaged nerve endings begin to recover. Your sense of taste and smell improve.  After 72 hours, the body is virtually free of nicotine. Your bronchial tubes relax and breathing becomes easier.  After 2 to 12 weeks, lungs can hold more air. Exercise becomes easier and circulation improves.  The risk of having a heart attack, stroke, cancer, or lung disease is greatly reduced.  After 1 year, the risk of coronary heart disease is cut in half.  After 5 years, the risk of stroke falls to the same as a nonsmoker.  After 10 years, the risk of lung cancer is cut in half and the risk of other cancers decreases significantly.  After 15 years, the risk of coronary heart disease drops, usually to the level of a nonsmoker.  If you are pregnant, quitting smoking will improve your chances of having a healthy baby.  The people you live with, especially any children, will be healthier.  You will have extra money to spend on things other than cigarettes. QUESTIONS TO THINK ABOUT BEFORE ATTEMPTING TO QUIT You may want to talk about your answers with your  health care provider.  Why do you want to quit?  If you tried to quit in the past, what helped and what did not?  What will be the most difficult situations for you after you quit? How will you plan to handle them?  Who can help you through the tough times? Your family? Friends? A health care provider?  What pleasures do you get from smoking? What ways can you still get pleasure if you quit? Here are some questions to ask your health care provider:  How can you help me to be successful at quitting?  What medicine do you think would be best for me and how should I take it?  What should I do if I need more help?  What is smoking withdrawal like? How can I get information on withdrawal? GET READY  Set a quit date.  Change your environment by getting rid of all cigarettes, ashtrays, matches, and lighters in your home, car, or work. Do not let people smoke in your home.  Review your past attempts to quit. Think about what worked and what did not. GET SUPPORT AND ENCOURAGEMENT You have a better chance of being successful if you have help. You can get support in many ways.  Tell your family, friends, and coworkers that you are going to quit and need their support. Ask them not to smoke around you.  Get individual, group, or telephone counseling and support. Programs are available at local hospitals and health centers. Call   your local health department for information about programs in your area.  Spiritual beliefs and practices may help some smokers quit.  Download a "quit meter" on your computer to keep track of quit statistics, such as how long you have gone without smoking, cigarettes not smoked, and money saved.  Get a self-help book about quitting smoking and staying off tobacco. LEARN NEW SKILLS AND BEHAVIORS  Distract yourself from urges to smoke. Talk to someone, go for a walk, or occupy your time with a task.  Change your normal routine. Take a different route to work.  Drink tea instead of coffee. Eat breakfast in a different place.  Reduce your stress. Take a hot bath, exercise, or read a book.  Plan something enjoyable to do every day. Reward yourself for not smoking.  Explore interactive web-based programs that specialize in helping you quit. GET MEDICINE AND USE IT CORRECTLY Medicines can help you stop smoking and decrease the urge to smoke. Combining medicine with the above behavioral methods and support can greatly increase your chances of successfully quitting smoking.  Nicotine replacement therapy helps deliver nicotine to your body without the negative effects and risks of smoking. Nicotine replacement therapy includes nicotine gum, lozenges, inhalers, nasal sprays, and skin patches. Some may be available over-the-counter and others require a prescription.  Antidepressant medicine helps people abstain from smoking, but how this works is unknown. This medicine is available by prescription.  Nicotinic receptor partial agonist medicine simulates the effect of nicotine in your brain. This medicine is available by prescription. Ask your health care provider for advice about which medicines to use and how to use them based on your health history. Your health care provider will tell you what side effects to look out for if you choose to be on a medicine or therapy. Carefully read the information on the package. Do not use any other product containing nicotine while using a nicotine replacement product.  RELAPSE OR DIFFICULT SITUATIONS Most relapses occur within the first 3 months after quitting. Do not be discouraged if you start smoking again. Remember, most people try several times before finally quitting. You may have symptoms of withdrawal because your body is used to nicotine. You may crave cigarettes, be irritable, feel very hungry, cough often, get headaches, or have difficulty concentrating. The withdrawal symptoms are only temporary. They are strongest  when you first quit, but they will go away within 10-14 days. To reduce the chances of relapse, try to:  Avoid drinking alcohol. Drinking lowers your chances of successfully quitting.  Reduce the amount of caffeine you consume. Once you quit smoking, the amount of caffeine in your body increases and can give you symptoms, such as a rapid heartbeat, sweating, and anxiety.  Avoid smokers because they can make you want to smoke.  Do not let weight gain distract you. Many smokers will gain weight when they quit, usually less than 10 pounds. Eat a healthy diet and stay active. You can always lose the weight gained after you quit.  Find ways to improve your mood other than smoking. FOR MORE INFORMATION  www.smokefree.gov  Document Released: 03/04/2001 Document Revised: 07/25/2013 Document Reviewed: 06/19/2011 Exit[employer] Patient Information 2015 [employer]. This information is not intended to replace advice given to you by your health care provider. Make sure you discuss any questions you have with your health care provider.  

## 2013-10-18 ENCOUNTER — Encounter: Payer: Self-pay | Admitting: Family Medicine

## 2013-10-18 ENCOUNTER — Telehealth: Payer: Self-pay | Admitting: Family Medicine

## 2013-10-18 LAB — NMR, LIPOPROFILE
CHOLESTEROL: 235 mg/dL — AB (ref 100–199)
HDL Cholesterol by NMR: 63 mg/dL (ref >39)
HDL Particle Number: 38 umol/L (ref >=30.5)
LDL Particle Number: 1867 nmol/L — ABNORMAL HIGH (ref <1000)
LDL Size: 20.6 nm (ref >20.5)
LDLC SERPL CALC-MCNC: 151 mg/dL — AB (ref 0–99)
LP-IR Score: 53 — ABNORMAL HIGH (ref <=45)
Small LDL Particle Number: 858 nmol/L — ABNORMAL HIGH (ref <=527)
TRIGLYCERIDES BY NMR: 107 mg/dL (ref 0–149)

## 2013-10-18 NOTE — Telephone Encounter (Signed)
Message copied by Waverly Ferrari on Tue Oct 18, 2013 12:10 PM ------      Message from: Chevis Pretty      Created: Tue Oct 18, 2013 12:06 PM       ldl particle numbers are improving      Continue current meds- low fat diet and exercise and recheck in 3 months       ------

## 2014-01-23 ENCOUNTER — Ambulatory Visit (INDEPENDENT_AMBULATORY_CARE_PROVIDER_SITE_OTHER): Payer: BC Managed Care – PPO | Admitting: Nurse Practitioner

## 2014-01-23 ENCOUNTER — Encounter: Payer: Self-pay | Admitting: Nurse Practitioner

## 2014-01-23 VITALS — BP 155/97 | HR 79 | Temp 98.5°F | Ht 65.0 in | Wt 203.4 lb

## 2014-01-23 DIAGNOSIS — R059 Cough, unspecified: Secondary | ICD-10-CM

## 2014-01-23 DIAGNOSIS — R05 Cough: Secondary | ICD-10-CM

## 2014-01-23 DIAGNOSIS — Z1212 Encounter for screening for malignant neoplasm of rectum: Secondary | ICD-10-CM

## 2014-01-23 DIAGNOSIS — R569 Unspecified convulsions: Secondary | ICD-10-CM

## 2014-01-23 DIAGNOSIS — E785 Hyperlipidemia, unspecified: Secondary | ICD-10-CM

## 2014-01-23 DIAGNOSIS — Z6833 Body mass index (BMI) 33.0-33.9, adult: Secondary | ICD-10-CM

## 2014-01-23 DIAGNOSIS — I10 Essential (primary) hypertension: Secondary | ICD-10-CM

## 2014-01-23 MED ORDER — ATORVASTATIN CALCIUM 40 MG PO TABS: 40.0000 mg | ORAL_TABLET | Freq: Every day | ORAL | Status: DC

## 2014-01-23 MED ORDER — HYDROCODONE-HOMATROPINE 5-1.5 MG/5ML PO SYRP: 5.0000 mL | ORAL_SOLUTION | Freq: Three times a day (TID) | ORAL | Status: DC | PRN

## 2014-01-23 MED ORDER — AMLODIPINE BESYLATE 5 MG PO TABS: 5.0000 mg | ORAL_TABLET | Freq: Every day | ORAL | Status: DC

## 2014-01-23 MED ORDER — LISINOPRIL 20 MG PO TABS: 20.0000 mg | ORAL_TABLET | Freq: Every day | ORAL | Status: DC

## 2014-01-23 MED ORDER — PHENYTOIN SODIUM EXTENDED 100 MG PO CAPS: 300.0000 mg | ORAL_CAPSULE | Freq: Every day | ORAL | Status: DC

## 2014-01-23 NOTE — Progress Notes (Signed)
Subjective:    Patient ID: Melanie Maynard, female    DOB: 06/16/1960, 53 y.o.   MRN: [id number]  Patient here today for follow up of chronic medical problems.  Hypertension This is a chronic problem. The current episode started today. The problem is unchanged. The problem is uncontrolled. Pertinent negatives include no blurred vision, headaches, palpitations, peripheral edema, shortness of breath or sweats. There are no associated agents to hypertension. Risk factors for coronary artery disease include dyslipidemia and obesity. Past treatments include calcium channel blockers. The current treatment provides mild improvement. Compliance problems include diet and exercise.   Hyperlipidemia This is a chronic problem. The current episode started more than 1 year ago. The problem is uncontrolled. Recent lipid tests were reviewed and are high. Exacerbating diseases include obesity. She has no history of diabetes or hypothyroidism. Pertinent negatives include no shortness of breath. Current antihyperlipidemic treatment includes statins. The current treatment provides mild improvement of lipids. Compliance problems include adherence to diet and adherence to exercise.  Risk factors for coronary artery disease include dyslipidemia, hypertension, obesity and post-menopausal.  seizures Has had for over 20 years- currently on dilantin- last seizure was over 13 years ago- sees neurologist 1X per year  * patient c/o cough- started about 1 week ago- has taken lots of meds OTC with no relief- mainly bothers her at night when she lays down  Review of Systems  Constitutional: Negative.  Negative for fever, chills, appetite change and fatigue.  HENT: Positive for congestion and postnasal drip.   Eyes: Negative for blurred vision.  Respiratory: Positive for cough (nonproductive). Negative for shortness of breath.   Cardiovascular: Negative for palpitations.  Gastrointestinal: Negative.   Genitourinary: Negative.     Neurological: Negative.  Negative for headaches.  Psychiatric/Behavioral: Negative.   All other systems reviewed and are negative.      Objective:   Physical Exam  Constitutional: She is oriented to person, place, and time. She appears well-developed and well-nourished.  HENT:  Nose: Nose normal.  Mouth/Throat: Oropharynx is clear and moist.  Eyes: EOM are normal.  Neck: Trachea normal, normal range of motion and full passive range of motion without pain. Neck supple. No JVD present. Carotid bruit is not present. No thyromegaly present.  Cardiovascular: Normal rate, regular rhythm, normal heart sounds and intact distal pulses.  Exam reveals no gallop and no friction rub.   No murmur heard. Pulmonary/Chest: Effort normal and breath sounds normal.  Abdominal: Soft. Bowel sounds are normal. She exhibits no distension and no mass. There is no tenderness.  Musculoskeletal: Normal range of motion.  Lymphadenopathy:    She has no cervical adenopathy.  Neurological: She is alert and oriented to person, place, and time. She has normal reflexes.  Skin: Skin is warm and dry.  Psychiatric: She has a normal mood and affect. Her behavior is normal. Judgment and thought content normal.    BP 155/97 mmHg  Pulse 79  Temp(Src) 98.5 F (36.9 C) (Oral)  Ht 5' 5" (1.651 m)  Wt 203 lb 6.4 oz (92.262 kg)  BMI 33.85 kg/m2  LMP 01/23/2001 (Approximate)       Assessment & Plan:   1. Essential hypertension Low NA+ diet Added lisinopril today to take with amlodipine - lisinopril (PRINIVIL,ZESTRIL) 20 MG tablet; Take 1 tablet (20 mg total) by mouth daily.  Dispense: 90 tablet; Refill: 1 - amLODipine (NORVASC) 5 MG tablet; Take 1 tablet (5 mg total) by mouth daily.  Dispense: 90 tablet; Refill: 1 -  CMP14+EGFR  2. Hyperlipidemia with target LDL less than 100 Low fat diet - atorvastatin (LIPITOR) 40 MG tablet; Take 1 tablet (40 mg total) by mouth daily.  Dispense: 90 tablet; Refill: 1 - NMR,  lipoprofile  3. BMI 33.0-33.9,adult Discussed diet and exercise for person with BMI >25 Will recheck weight in 3-6 months   4. Seizures Keep follow up appointmentt with neurologist - phenytoin (DILANTIN) 100 MG ER capsule; Take 3 capsules (300 mg total) by mouth at bedtime.  Dispense: 270 capsule; Refill: 1  5. Cough Force fluids - hycodan 1 tsp po Q^ prn 176m 0 refills  Labs pending Health maintenance reviewed Diet and exercise encouraged Continue all meds Follow up  In 3 months   [place] FNP

## 2014-01-23 NOTE — Patient Instructions (Signed)
Exercise to Stay Healthy Exercise helps you become and stay healthy. EXERCISE IDEAS AND TIPS Choose exercises that:  You enjoy.  Fit into your day. You do not need to exercise really hard to be healthy. You can do exercises at a slow or medium level and stay healthy. You can:  Stretch before and after working out.  Try yoga, Pilates, or tai chi.  Lift weights.  Walk fast, swim, jog, run, climb stairs, bicycle, dance, or rollerskate.  Take aerobic classes. Exercises that burn about 150 calories:  Running 1  miles in 15 minutes.  Playing volleyball for 45 to 60 minutes.  Washing and waxing a car for 45 to 60 minutes.  Playing touch football for 45 minutes.  Walking 1  miles in 35 minutes.  Pushing a stroller 1  miles in 30 minutes.  Playing basketball for 30 minutes.  Raking leaves for 30 minutes.  Bicycling 5 miles in 30 minutes.  Walking 2 miles in 30 minutes.  Dancing for 30 minutes.  Shoveling snow for 15 minutes.  Swimming laps for 20 minutes.  Walking up stairs for 15 minutes.  Bicycling 4 miles in 15 minutes.  Gardening for 30 to 45 minutes.  Jumping rope for 15 minutes.  Washing windows or floors for 45 to 60 minutes. Document Released: 04/12/2010 Document Revised: 06/02/2011 Document Reviewed: 04/12/2010 Exit[employer] Patient Information 2015 [employer]. This information is not intended to replace advice given to you by your health care provider. Make sure you discuss any questions you have with your health care provider.  

## 2014-01-23 NOTE — Progress Notes (Signed)
Lab only 

## 2014-01-24 LAB — CMP14+EGFR
ALK PHOS: 209 IU/L — AB (ref 39–117)
ALT: 13 IU/L (ref 0–32)
AST: 14 IU/L (ref 0–40)
Albumin/Globulin Ratio: 1.6 (ref 1.1–2.5)
Albumin: 4.3 g/dL (ref 3.5–5.5)
BILIRUBIN TOTAL: 0.3 mg/dL (ref 0.0–1.2)
BUN / CREAT RATIO: 17 (ref 9–23)
BUN: 13 mg/dL (ref 6–24)
CO2: 25 mmol/L (ref 18–29)
CREATININE: 0.78 mg/dL (ref 0.57–1.00)
Calcium: 9.3 mg/dL (ref 8.7–10.2)
Chloride: 101 mmol/L (ref 97–108)
GFR, EST AFRICAN AMERICAN: 100 mL/min/{1.73_m2} (ref >59)
GFR, EST NON AFRICAN AMERICAN: 87 mL/min/{1.73_m2} (ref >59)
GLOBULIN, TOTAL: 2.7 g/dL (ref 1.5–4.5)
Glucose: 95 mg/dL (ref 65–99)
Potassium: 4.2 mmol/L (ref 3.5–5.2)
SODIUM: 140 mmol/L (ref 134–144)
Total Protein: 7 g/dL (ref 6.0–8.5)

## 2014-01-24 LAB — NMR, LIPOPROFILE
Cholesterol: 173 mg/dL (ref 100–199)
HDL CHOLESTEROL BY NMR: 56 mg/dL (ref >39)
HDL Particle Number: 39.8 umol/L (ref >=30.5)
LDL Particle Number: 1135 nmol/L — ABNORMAL HIGH (ref <1000)
LDL Size: 20.6 nm (ref >20.5)
LDL-C: 90 mg/dL (ref 0–99)
LP-IR SCORE: 56 — AB (ref <=45)
SMALL LDL PARTICLE NUMBER: 625 nmol/L — AB (ref <=527)
Triglycerides by NMR: 134 mg/dL (ref 0–149)

## 2014-01-24 LAB — FECAL OCCULT BLOOD, IMMUNOCHEMICAL: FECAL OCCULT BLD: POSITIVE — AB

## 2014-02-27 ENCOUNTER — Telehealth: Payer: Self-pay | Admitting: Nurse Practitioner

## 2014-03-01 NOTE — Telephone Encounter (Signed)
ntbs

## 2014-03-01 NOTE — Telephone Encounter (Signed)
LM,  NTBS,  Call us to speak with triage for appointment.

## 2014-05-04 ENCOUNTER — Telehealth: Payer: Self-pay | Admitting: Nurse Practitioner

## 2014-05-24 ENCOUNTER — Telehealth: Payer: Self-pay | Admitting: Nurse Practitioner

## 2014-05-24 NOTE — Telephone Encounter (Signed)
Will fax appt date today per lisa baker.

## 2014-06-01 ENCOUNTER — Other Ambulatory Visit: Payer: Self-pay | Admitting: Family

## 2014-07-03 ENCOUNTER — Encounter: Payer: Self-pay | Admitting: Nurse Practitioner

## 2014-07-03 ENCOUNTER — Ambulatory Visit (INDEPENDENT_AMBULATORY_CARE_PROVIDER_SITE_OTHER): Payer: BLUE CROSS/BLUE SHIELD | Admitting: Nurse Practitioner

## 2014-07-03 VITALS — BP 154/88 | HR 76 | Temp 97.2°F | Ht 65.0 in | Wt 210.0 lb

## 2014-07-03 DIAGNOSIS — Z0000 Encounter for general adult medical examination without abnormal findings: Secondary | ICD-10-CM | POA: Diagnosis not present

## 2014-07-03 DIAGNOSIS — R569 Unspecified convulsions: Secondary | ICD-10-CM

## 2014-07-03 DIAGNOSIS — E785 Hyperlipidemia, unspecified: Secondary | ICD-10-CM

## 2014-07-03 DIAGNOSIS — Z6833 Body mass index (BMI) 33.0-33.9, adult: Secondary | ICD-10-CM

## 2014-07-03 DIAGNOSIS — I10 Essential (primary) hypertension: Secondary | ICD-10-CM

## 2014-07-03 LAB — POCT URINALYSIS DIPSTICK
Bilirubin, UA: NEGATIVE
Glucose, UA: NEGATIVE
KETONES UA: NEGATIVE
Leukocytes, UA: NEGATIVE
Nitrite, UA: NEGATIVE
PH UA: 6
PROTEIN UA: NEGATIVE
SPEC GRAV UA: 1.01
Urobilinogen, UA: NEGATIVE

## 2014-07-03 LAB — POCT UA - MICROSCOPIC ONLY
CASTS, UR, LPF, POC: NEGATIVE
Crystals, Ur, HPF, POC: NEGATIVE
Mucus, UA: NEGATIVE
YEAST UA: NEGATIVE

## 2014-07-03 LAB — POCT CBC
Granulocyte percent: 61 %G (ref 37–80)
HCT, POC: 42.2 % (ref 37.7–47.9)
HEMOGLOBIN: 14.1 g/dL (ref 12.2–16.2)
Lymph, poc: 4.8 — AB (ref 0.6–3.4)
MCH, POC: 27 pg (ref 27–31.2)
MCHC: 33.4 g/dL (ref 31.8–35.4)
MCV: 80.8 fL (ref 80–97)
MPV: 8.6 fL (ref 0–99.8)
POC GRANULOCYTE: 7.8 — AB (ref 2–6.9)
POC LYMPH PERCENT: 37.2 %L (ref 10–50)
Platelet Count, POC: 209 10*3/uL (ref 142–424)
RBC: 5.23 M/uL (ref 4.04–5.48)
RDW, POC: 13.9 %
WBC: 12.8 10*3/uL — AB (ref 4.6–10.2)

## 2014-07-03 MED ORDER — LISINOPRIL 40 MG PO TABS
40.0000 mg | ORAL_TABLET | Freq: Every day | ORAL | Status: DC
Start: 2014-07-03 — End: 2014-09-26

## 2014-07-03 MED ORDER — ATORVASTATIN CALCIUM 40 MG PO TABS: 40.0000 mg | ORAL_TABLET | Freq: Every day | ORAL | Status: DC

## 2014-07-03 NOTE — Progress Notes (Signed)
Subjective:    Patient ID: Melanie Maynard, female    DOB: Jan 26, 1961, 54 y.o.   MRN: [id number]  Patient is here for an annual physical with no pap. She is also c/o a productive cough- states she was seen at an urgent care in march for this and received a cough medication-this is gradually improving.   Hypertension This is a chronic problem. The current episode started more than 1 year ago. The problem is unchanged. The problem is uncontrolled. Pertinent negatives include no blurred vision, chest pain, headaches, palpitations, peripheral edema, shortness of breath or sweats. There are no associated agents to hypertension. Risk factors for coronary artery disease include dyslipidemia and obesity. Past treatments include calcium channel blockers and ACE inhibitors. The current treatment provides mild improvement. Compliance problems include diet and exercise.   Hyperlipidemia This is a chronic problem. The current episode started more than 1 year ago. The problem is uncontrolled. Recent lipid tests were reviewed and are high. Exacerbating diseases include obesity. She has no history of diabetes or hypothyroidism. Pertinent negatives include no chest pain, myalgias or shortness of breath. Current antihyperlipidemic treatment includes statins. The current treatment provides mild improvement of lipids. Compliance problems include adherence to diet and adherence to exercise.  Risk factors for coronary artery disease include dyslipidemia, hypertension, obesity and post-menopausal.  seizures Has had for over 20 years- currently on dilantin- last seizure was over 14 years ago- sees neurologist 1X per year   Review of Systems  Constitutional: Negative.  Negative for fever, chills, appetite change and fatigue.  HENT: Negative for congestion and postnasal drip.   Eyes: Negative for blurred vision.  Respiratory: Positive for cough (nonproductive). Negative for chest tightness and shortness of breath.     Cardiovascular: Negative for chest pain, palpitations and leg swelling.  Gastrointestinal: Negative.   Endocrine: Negative for polydipsia, polyphagia and polyuria.  Genitourinary: Negative.   Musculoskeletal: Negative for myalgias.  Neurological: Negative.  Negative for headaches.  Psychiatric/Behavioral: Negative.   All other systems reviewed and are negative.      Objective:   Physical Exam  Constitutional: She is oriented to person, place, and time. She appears well-developed and well-nourished.  HENT:  Nose: Nose normal.  Mouth/Throat: Oropharynx is clear and moist.  Eyes: EOM are normal.  Neck: Trachea normal, normal range of motion and full passive range of motion without pain. Neck supple. No JVD present. Carotid bruit is not present. No thyromegaly present.  Cardiovascular: Normal rate, regular rhythm, normal heart sounds and intact distal pulses.  Exam reveals no gallop and no friction rub.   No murmur heard. Pulmonary/Chest: Effort normal and breath sounds normal.  Abdominal: Soft. Bowel sounds are normal. She exhibits no distension and no mass. There is no tenderness.  Genitourinary:  Pelvic exam no adnexal masses or tenderness  Musculoskeletal: Normal range of motion.  Lymphadenopathy:    She has no cervical adenopathy.  Neurological: She is alert and oriented to person, place, and time. She has normal reflexes.  Skin: Skin is warm and dry.  Cholesterol deposit left eye lid  Psychiatric: She has a normal mood and affect. Her behavior is normal. Judgment and thought content normal.    BP 154/104 mmHg  Pulse 76  Temp(Src) 97.2 F (36.2 C) (Oral)  Ht 5' 5" (1.651 m)  Wt 210 lb (95.255 kg)  BMI 34.95 kg/m2  LMP 01/23/2001 (Approximate)       Assessment & Plan:   1. Annual physical exam - POCT  UA - Microscopic Only - POCT urinalysis dipstick - POCT CBC - Thyroid Panel With TSH - Vit D  25 hydroxy (rtn osteoporosis monitoring)  2. Hyperlipidemia with  target LDL less than 100 Low fat diet - atorvastatin (LIPITOR) 40 MG tablet; Take 1 tablet (40 mg total) by mouth daily.  Dispense: 90 tablet; Refill: 1 - NMR, lipoprofile  3. Essential hypertension Do not add alt o diet Increased lisinopril form 20 mg to 40 mg at visit - lisinopril (PRINIVIL,ZESTRIL) 40 MG tablet; Take 1 tablet (40 mg total) by mouth daily.  Dispense: 90 tablet; Refill: 1 - CMP14+EGFR  4. BMI 33.0-33.9,adult Discussed diet and exercise for person with BMI >25 Will recheck weight in 3-6 months   5. Seizures Keep follow up appointments with specialist    Labs pending Health maintenance reviewed Diet and exercise encouraged Continue all meds Follow up  In 3 mponths   [name] Done, FNP

## 2014-07-03 NOTE — Addendum Note (Signed)
Addended by: Earlene Plater on: 07/03/2014 12:04 PM   Modules accepted: Miquel Dunn

## 2014-07-04 LAB — NMR, LIPOPROFILE
Cholesterol: 139 mg/dL (ref 100–199)
HDL Cholesterol by NMR: 57 mg/dL (ref >39)
HDL Particle Number: 41.8 umol/L (ref >=30.5)
LDL PARTICLE NUMBER: 720 nmol/L (ref <1000)
LDL SIZE: 20.7 nm (ref >20.5)
LDL-C: 60 mg/dL (ref 0–99)
LP-IR Score: 56 — ABNORMAL HIGH (ref <=45)
Small LDL Particle Number: 320 nmol/L (ref <=527)
TRIGLYCERIDES BY NMR: 109 mg/dL (ref 0–149)

## 2014-07-04 LAB — CMP14+EGFR
ALT: 10 IU/L (ref 0–32)
AST: 10 IU/L (ref 0–40)
Albumin/Globulin Ratio: 1.3 (ref 1.1–2.5)
Albumin: 3.9 g/dL (ref 3.5–5.5)
Alkaline Phosphatase: 206 IU/L — ABNORMAL HIGH (ref 39–117)
BILIRUBIN TOTAL: 0.2 mg/dL (ref 0.0–1.2)
BUN/Creatinine Ratio: 17 (ref 9–23)
BUN: 12 mg/dL (ref 6–24)
CHLORIDE: 103 mmol/L (ref 97–108)
CO2: 25 mmol/L (ref 18–29)
Calcium: 8.9 mg/dL (ref 8.7–10.2)
Creatinine, Ser: 0.69 mg/dL (ref 0.57–1.00)
GFR calc Af Amer: 115 mL/min/{1.73_m2} (ref >59)
GFR calc non Af Amer: 100 mL/min/{1.73_m2} (ref >59)
Globulin, Total: 3 g/dL (ref 1.5–4.5)
Glucose: 94 mg/dL (ref 65–99)
POTASSIUM: 4.1 mmol/L (ref 3.5–5.2)
Sodium: 142 mmol/L (ref 134–144)
Total Protein: 6.9 g/dL (ref 6.0–8.5)

## 2014-07-04 LAB — THYROID PANEL WITH TSH
FREE THYROXINE INDEX: 2.2 (ref 1.2–4.9)
T3 Uptake Ratio: 28 % (ref 24–39)
T4, Total: 8 ug/dL (ref 4.5–12.0)
TSH: 1.8 u[IU]/mL (ref 0.450–4.500)

## 2014-07-04 LAB — VITAMIN D 25 HYDROXY (VIT D DEFICIENCY, FRACTURES): VIT D 25 HYDROXY: 8.7 ng/mL — AB (ref 30.0–100.0)

## 2014-09-26 ENCOUNTER — Ambulatory Visit (INDEPENDENT_AMBULATORY_CARE_PROVIDER_SITE_OTHER): Payer: BLUE CROSS/BLUE SHIELD | Admitting: Nurse Practitioner

## 2014-09-26 ENCOUNTER — Ambulatory Visit (INDEPENDENT_AMBULATORY_CARE_PROVIDER_SITE_OTHER): Payer: BLUE CROSS/BLUE SHIELD

## 2014-09-26 ENCOUNTER — Encounter (INDEPENDENT_AMBULATORY_CARE_PROVIDER_SITE_OTHER): Payer: Self-pay

## 2014-09-26 ENCOUNTER — Encounter: Payer: Self-pay | Admitting: Nurse Practitioner

## 2014-09-26 VITALS — BP 146/89 | HR 90 | Temp 97.3°F | Ht 65.0 in | Wt 205.0 lb

## 2014-09-26 DIAGNOSIS — Z6834 Body mass index (BMI) 34.0-34.9, adult: Secondary | ICD-10-CM | POA: Diagnosis not present

## 2014-09-26 DIAGNOSIS — R059 Cough, unspecified: Secondary | ICD-10-CM

## 2014-09-26 DIAGNOSIS — E785 Hyperlipidemia, unspecified: Secondary | ICD-10-CM | POA: Diagnosis not present

## 2014-09-26 DIAGNOSIS — R05 Cough: Secondary | ICD-10-CM

## 2014-09-26 DIAGNOSIS — I10 Essential (primary) hypertension: Secondary | ICD-10-CM

## 2014-09-26 DIAGNOSIS — R569 Unspecified convulsions: Secondary | ICD-10-CM | POA: Diagnosis not present

## 2014-09-26 MED ORDER — AMLODIPINE BESYLATE 5 MG PO TABS: 5.0000 mg | ORAL_TABLET | Freq: Every day | ORAL | Status: DC

## 2014-09-26 MED ORDER — LOSARTAN POTASSIUM 100 MG PO TABS: 100.0000 mg | ORAL_TABLET | Freq: Every day | ORAL | Status: DC

## 2014-09-26 MED ORDER — PHENYTOIN SODIUM EXTENDED 100 MG PO CAPS: 300.0000 mg | ORAL_CAPSULE | Freq: Every day | ORAL | Status: DC

## 2014-09-26 NOTE — Progress Notes (Signed)
Subjective:    Patient ID: Melanie Maynard, female    DOB: [birth year], 54 y.o.   MRN: [id number]  Patient here today for follow up of chronic medical problems. Patient in c/o cough- has been going on for over a year. Says that it is a productive cough- white frothy appearing. Has seen GI and dx with reflux and he gave her omeprazole which has helped some.  * Has not taking meds this morning. Hypertension This is a chronic problem. The current episode started more than 1 year ago. The problem is unchanged. The problem is uncontrolled. Pertinent negatives include no blurred vision, chest pain, headaches, palpitations, peripheral edema, shortness of breath or sweats. There are no associated agents to hypertension. Risk factors for coronary artery disease include dyslipidemia and obesity. Past treatments include calcium channel blockers and ACE inhibitors. The current treatment provides mild improvement. Compliance problems include diet and exercise.   Hyperlipidemia This is a chronic problem. The current episode started more than 1 year ago. The problem is uncontrolled. Recent lipid tests were reviewed and are high. Exacerbating diseases include obesity. She has no history of diabetes or hypothyroidism. Pertinent negatives include no chest pain, myalgias or shortness of breath. Current antihyperlipidemic treatment includes statins. The current treatment provides mild improvement of lipids. Compliance problems include adherence to diet and adherence to exercise.  Risk factors for coronary artery disease include dyslipidemia, hypertension, obesity and post-menopausal.  seizures Has had for over 20 years- currently on dilantin- last seizure was over 14 years ago- sees neurologist 1X per year   Review of Systems  Constitutional: Negative.  Negative for fever, chills, appetite change and fatigue.  HENT: Negative for congestion and postnasal drip.   Eyes: Negative for blurred vision.  Respiratory: Positive  for cough (nonproductive). Negative for chest tightness and shortness of breath.   Cardiovascular: Negative for chest pain, palpitations and leg swelling.  Gastrointestinal: Negative.   Endocrine: Negative for polydipsia, polyphagia and polyuria.  Genitourinary: Negative.   Musculoskeletal: Negative for myalgias.  Neurological: Negative.  Negative for headaches.  Psychiatric/Behavioral: Negative.   All other systems reviewed and are negative.      Objective:   Physical Exam  Constitutional: She is oriented to person, place, and time. She appears well-developed and well-nourished.  HENT:  Nose: Nose normal.  Mouth/Throat: Oropharynx is clear and moist.  Eyes: EOM are normal.  Neck: Trachea normal, normal range of motion and full passive range of motion without pain. Neck supple. No JVD present. Carotid bruit is not present. No thyromegaly present.  Cardiovascular: Normal rate, regular rhythm, normal heart sounds and intact distal pulses.  Exam reveals no gallop and no friction rub.   No murmur heard. Pulmonary/Chest: Effort normal and breath sounds normal.  Abdominal: Soft. Bowel sounds are normal. She exhibits no distension and no mass. There is no tenderness.  Genitourinary:  Pelvic exam no adnexal masses or tenderness  Musculoskeletal: Normal range of motion.  Lymphadenopathy:    She has no cervical adenopathy.  Neurological: She is alert and oriented to person, place, and time. She has normal reflexes.  Skin: Skin is warm and dry.  Cholesterol deposit left eye lid  Psychiatric: She has a normal mood and affect. Her behavior is normal. Judgment and thought content normal.   BP 146/89 mmHg  Pulse 90  Temp(Src) 97.3 F (36.3 C) (Oral)  Ht _0  (1.651 m)  Wt 205 lb (92.987 kg)  BMI 34.11 kg/m2  LMP 01/23/2001 (Approximate)  Assessment & Plan:   1. Cough Changed lisinopril to losartan Call if no relief - DG Chest 2 View; Future  2. Essential  hypertension Stopped lisinopril due to cough - losartan (COZAAR) 100 MG tablet; Take 1 tablet (100 mg total) by mouth daily.  Dispense: 90 tablet; Refill: 1 - amLODipine (NORVASC) 5 MG tablet; Take 1 tablet (5 mg total) by mouth daily.  Dispense: 90 tablet; Refill: 1 - CMP14+EGFR  3. Seizures - phenytoin (DILANTIN) 100 MG ER capsule; Take 3 capsules (300 mg total) by mouth at bedtime.  Dispense: 270 capsule; Refill: 1  4. Hyperlipidemia with target LDL less than 100 Low fat diet - Lipid panel  5. BMI 34.0-34.9,adult Discussed diet and exercise for person with BMI >25 Will recheck weight in 3-6 months     Labs pending Health maintenance reviewed Diet and exercise encouraged Continue all meds Follow up  In 3 months   [place], FNP

## 2014-09-26 NOTE — Patient Instructions (Signed)
Cough, Adult  A cough is a reflex that helps clear your throat and airways. It can help heal the body or may be a reaction to an irritated airway. A cough may only last 2 or 3 weeks (acute) or may last more than 8 weeks (chronic).  CAUSES Acute cough:  Viral or bacterial infections. Chronic cough:  Infections.  Allergies.  Asthma.  Post-nasal drip.  Smoking.  Heartburn or acid reflux.  Some medicines.  Chronic lung problems (COPD).  Cancer. SYMPTOMS   Cough.  Fever.  Chest pain.  Increased breathing rate.  High-pitched whistling sound when breathing (wheezing).  Colored mucus that you cough up (sputum). TREATMENT   A bacterial cough may be treated with antibiotic medicine.  A viral cough must run its course and will not respond to antibiotics.  Your caregiver may recommend other treatments if you have a chronic cough. HOME CARE INSTRUCTIONS   Only take over-the-counter or prescription medicines for pain, discomfort, or fever as directed by your caregiver. Use cough suppressants only as directed by your caregiver.  Use a cold steam vaporizer or humidifier in your bedroom or home to help loosen secretions.  Sleep in a semi-upright position if your cough is worse at night.  Rest as needed.  Stop smoking if you smoke. SEEK IMMEDIATE MEDICAL CARE IF:   You have pus in your sputum.  Your cough starts to worsen.  You cannot control your cough with suppressants and are losing sleep.  You begin coughing up blood.  You have difficulty breathing.  You develop pain which is getting worse or is uncontrolled with medicine.  You have a fever. MAKE SURE YOU:   Understand these instructions.  Will watch your condition.  Will get help right away if you are not doing well or get worse. Document Released: 09/06/2010 Document Revised: 06/02/2011 Document Reviewed: 09/06/2010 Exit[employer] Patient Information 2015 [employer]. This information is not intended  to replace advice given to you by your health care provider. Make sure you discuss any questions you have with your health care provider.  

## 2014-10-02 ENCOUNTER — Ambulatory Visit: Payer: BLUE CROSS/BLUE SHIELD | Admitting: Nurse Practitioner

## 2014-10-09 ENCOUNTER — Encounter: Payer: Self-pay | Admitting: Nurse Practitioner

## 2014-10-09 ENCOUNTER — Ambulatory Visit (INDEPENDENT_AMBULATORY_CARE_PROVIDER_SITE_OTHER): Payer: BLUE CROSS/BLUE SHIELD | Admitting: Nurse Practitioner

## 2014-10-09 VITALS — BP 140/98 | HR 69 | Ht 64.0 in | Wt 206.0 lb

## 2014-10-09 DIAGNOSIS — I10 Essential (primary) hypertension: Secondary | ICD-10-CM

## 2014-10-09 DIAGNOSIS — R569 Unspecified convulsions: Secondary | ICD-10-CM

## 2014-10-09 DIAGNOSIS — Z5181 Encounter for therapeutic drug level monitoring: Secondary | ICD-10-CM | POA: Diagnosis not present

## 2014-10-09 MED ORDER — PHENYTOIN SODIUM EXTENDED 100 MG PO CAPS: 300.0000 mg | ORAL_CAPSULE | Freq: Every day | ORAL | Status: DC

## 2014-10-09 NOTE — Patient Instructions (Signed)
Continue Dilantin at current dose will refill 3 month with 3 refills Call for any seizure activity Follow-up yearly and when necessary 

## 2014-10-09 NOTE — Progress Notes (Signed)
[facility]  PATIENT: Melanie Maynard DOB: 04/17/1960   REASON FOR VISIT: Follow-up for seizure disorder HISTORY FROM: Patient    HISTORY OF PRESENT ILLNESS:Ms. Melanie Maynard, 54 year old black female returns for followup. She was last seen 10/07/13. She has a history of seizure disorder, last seizure was approximately 20 years ago. She is currently on Dilantin without side effects. She denies daytime drowsiness or feelings of being off balance. She has had no evidence of bowel or bladder incontinence, no tongue biting, no jerking of extremities. Sleeping well, appetite is reportedly good. Medical history also includes hypertension and elevated cholesterol. Blood pressure noted to be elevated today, she says she just took her medication .Recent labs at primary care in April. She returns for reevaluation and refills   REVIEW OF SYSTEMS: Full 14 system review of systems performed and notable only for those listed, all others are neg:  Constitutional: neg  Cardiovascular: neg Ear/Nose/Throat: neg  Skin: neg Eyes: neg Respiratory: neg Gastroitestinal: neg  Hematology/Lymphatic: neg  Endocrine: neg Musculoskeletal:neg Allergy/Immunology: neg Neurological: neg Psychiatric: neg Sleep : neg   ALLERGIES: No Known Allergies  HOME MEDICATIONS: Outpatient Prescriptions Prior to Visit  Medication Sig Dispense Refill  . amLODipine (NORVASC) 5 MG tablet Take 1 tablet (5 mg total) by mouth daily. 90 tablet 1  . atorvastatin (LIPITOR) 40 MG tablet Take 1 tablet (40 mg total) by mouth daily. 90 tablet 1  . losartan (COZAAR) 100 MG tablet Take 1 tablet (100 mg total) by mouth daily. 90 tablet 1  . omeprazole (PRILOSEC) 40 MG capsule Take 40 mg by mouth daily.    . phenytoin (DILANTIN) 100 MG ER capsule Take 3 capsules (300 mg total) by mouth at bedtime. 270 capsule 1   No facility-administered medications prior to visit.    PAST MEDICAL HISTORY: Past Medical History  Diagnosis  Date  . Hypertension   . Seizures     PAST SURGICAL HISTORY: Past Surgical History  Procedure Laterality Date  . Abdominal hysterectomy    . Cholecystectomy    . Appendectomy      FAMILY HISTORY: Family History  Problem Relation Age of Onset  . Healthy Mother   . Healthy Father     SOCIAL HISTORY: History   Social History  . Marital Status: Single    Spouse Name: N/A  . Number of Children: 0  . Years of Education: 12   Occupational History  .     Social History Main Topics  . Smoking status: Current Every Day Smoker -- 1.00 packs/day    Types: Cigarettes  . Smokeless tobacco: Never Used  . Alcohol Use: No  . Drug Use: No  . Sexual Activity: Not on file   Other Topics Concern  . Not on file   Social History Narrative   Patient is single with no children   Patient is right handed   Patient has a high school education   Patient drinks 2-3 cups daily     PHYSICAL EXAM  Filed Vitals:   10/09/14 0809  BP: 140/98  Pulse: 69  Height: 5\' 4"  (1.626 m)  Weight: 206 lb (93.441 kg)   Body mass index is 35.34 kg/(m^2). General: well developed, well nourished, seated, in no evident distress  Head: head normocephalic and atraumatic. Oropharynx benign  Neck: supple with no carotid bruits  Cardiovascular: regular rate and rhythm, no murmurs  Neurologic Exam  Mental Status: Awake and fully alert. Oriented to place and time. Follows all commands.  Speech and language normal.  Cranial Nerves: Pupils equal, briskly reactive to light. Extraocular movements full without nystagmus. Visual fields full to confrontation. Hearing intact and symmetric to finger snap. Facial sensation intact. Face, tongue, palate move normally and symmetrically. Neck flexion and extension normal.  Motor: Normal bulk and tone. Normal strength in all tested extremity muscles.No focal weakness  Coordination: Rapid alternating movements normal in all extremities. Finger-to-nose and  heel-to-shin performed accurately bilaterally. No dysmetria  Gait and Station: Arises from chair without difficulty. Stance is normal. Gait demonstrates normal stride length and balance . Able to heel, toe and tandem walk without difficulty.  Reflexes: 1+ and symmetric. Toes downgoing.    DIAGNOSTIC DATA (LABS, IMAGING, TESTING) - I reviewed patient records, labs, notes, testing and imaging myself where available.  Lab Results  Component Value Date   WBC 12.8* 07/03/2014   HGB 14.1 07/03/2014   HCT 42.2 07/03/2014   MCV 80.8 07/03/2014      Component Value Date/Time   NA 142 07/03/2014 1204   NA 137 09/22/2012 1213   K 4.1 07/03/2014 1204   CL 103 07/03/2014 1204   CO2 25 07/03/2014 1204   GLUCOSE 94 07/03/2014 1204   GLUCOSE 88 09/22/2012 1213   BUN 12 07/03/2014 1204   BUN 15 09/22/2012 1213   CREATININE 0.69 07/03/2014 1204   CREATININE 0.77 09/22/2012 1213   CALCIUM 8.9 07/03/2014 1204   PROT 6.9 07/03/2014 1204   PROT 6.8 09/22/2012 1213   ALBUMIN 3.9 09/22/2012 1213   AST 10 07/03/2014 1204   ALT 10 07/03/2014 1204   ALKPHOS 206* 07/03/2014 1204   BILITOT 0.2 07/03/2014 1204   BILITOT 0.3 01/23/2014 1017   GFRNONAA 100 07/03/2014 1204   GFRNONAA 89 09/22/2012 1213   GFRAA 115 07/03/2014 1204   GFRAA >89 09/22/2012 1213   Lab Results  Component Value Date   CHOL 139 07/03/2014   HDL 57 07/03/2014   LDLCALC 151* 10/17/2013   TRIG 109 07/03/2014    Lab Results  Component Value Date   TSH 1.800 07/03/2014      ASSESSMENT AND PLAN  54 y.o. year old female  has a past medical history of Hypertension and Seizures. here to follow-up. No seizure activity in 20 years. Currently on Dilantin 300 daily. Blood pressure noted to be elevated today however patient just took her medication before the appointment.  Continue Dilantin at current dose will refill 3 month with 3 refills Call for any seizure activity Will check Dilantin level Follow-up yearly and when  necessary Dennie Bible, [facility], [facility], [facility] [address], [place], [place] [address] [phone]

## 2014-10-09 NOTE — Progress Notes (Signed)
I have read the note, and I agree with the clinical assessment and plan.  Vivian Neuwirth KEITH   

## 2014-10-10 LAB — PHENYTOIN LEVEL, TOTAL: Phenytoin (Dilantin), Serum: 4.9 ug/mL — ABNORMAL LOW (ref 10.0–20.0)

## 2014-10-10 NOTE — Progress Notes (Signed)
Quick Note:  I called pt and relayed the results of dilantin level. (stable). No changes with medication. Call back if quesitons. ______

## 2014-12-28 ENCOUNTER — Ambulatory Visit: Payer: BLUE CROSS/BLUE SHIELD | Admitting: Nurse Practitioner

## 2015-01-19 ENCOUNTER — Ambulatory Visit (INDEPENDENT_AMBULATORY_CARE_PROVIDER_SITE_OTHER): Payer: BLUE CROSS/BLUE SHIELD | Admitting: Nurse Practitioner

## 2015-01-19 ENCOUNTER — Encounter: Payer: Self-pay | Admitting: Nurse Practitioner

## 2015-01-19 VITALS — BP 142/88 | HR 93 | Temp 97.2°F | Ht 64.0 in | Wt 207.0 lb

## 2015-01-19 DIAGNOSIS — R7309 Other abnormal glucose: Secondary | ICD-10-CM | POA: Diagnosis not present

## 2015-01-19 DIAGNOSIS — I10 Essential (primary) hypertension: Secondary | ICD-10-CM

## 2015-01-19 DIAGNOSIS — R569 Unspecified convulsions: Secondary | ICD-10-CM | POA: Diagnosis not present

## 2015-01-19 DIAGNOSIS — Z1159 Encounter for screening for other viral diseases: Secondary | ICD-10-CM

## 2015-01-19 DIAGNOSIS — E785 Hyperlipidemia, unspecified: Secondary | ICD-10-CM | POA: Diagnosis not present

## 2015-01-19 DIAGNOSIS — E1141 Type 2 diabetes mellitus with diabetic mononeuropathy: Secondary | ICD-10-CM | POA: Diagnosis not present

## 2015-01-19 DIAGNOSIS — R739 Hyperglycemia, unspecified: Secondary | ICD-10-CM

## 2015-01-19 LAB — POCT GLYCOSYLATED HEMOGLOBIN (HGB A1C): Hemoglobin A1C: 7.5

## 2015-01-19 MED ORDER — METFORMIN HCL 500 MG PO TABS: 500.0000 mg | ORAL_TABLET | Freq: Two times a day (BID) | ORAL | Status: DC

## 2015-01-19 MED ORDER — GLUCOSE BLOOD VI STRP: ORAL_STRIP | Status: DC

## 2015-01-19 MED ORDER — ONETOUCH DELICA LANCETS 33G MISC: 1.0000 | Freq: Every day | Status: DC

## 2015-01-19 NOTE — Progress Notes (Signed)
Subjective:    Patient ID: Melanie Maynard, female    DOB: 20-Sep-1960, 54 y.o.   MRN: [id number]  Patient here today for follow up of chronic medical problems.     Hypertension This is a chronic problem. The current episode started more than 1 year ago. The problem is unchanged. The problem is uncontrolled. Pertinent negatives include no blurred vision, chest pain, headaches, palpitations, peripheral edema, shortness of breath or sweats. There are no associated agents to hypertension. Risk factors for coronary artery disease include dyslipidemia and obesity. Past treatments include calcium channel blockers and ACE inhibitors. The current treatment provides mild improvement. Compliance problems include diet and exercise.   Hyperlipidemia This is a chronic problem. The current episode started more than 1 year ago. The problem is uncontrolled. Recent lipid tests were reviewed and are high. Exacerbating diseases include obesity. She has no history of diabetes or hypothyroidism. Pertinent negatives include no chest pain, myalgias or shortness of breath. Current antihyperlipidemic treatment includes statins. The current treatment provides mild improvement of lipids. Compliance problems include adherence to diet and adherence to exercise.  Risk factors for coronary artery disease include dyslipidemia, hypertension, obesity and post-menopausal.  seizures Has had for over 20 years- currently on dilantin- last seizure was over 14 years ago- sees neurologist 1X per year Left foot neuropathy  Has been increasing in pain- worse when driving  * Patient works at [employer] and they checked hr Hgba1c and it was 8.5%  Review of Systems  Constitutional: Negative.  Negative for fever, chills, appetite change and fatigue.  HENT: Negative for congestion and postnasal drip.   Eyes: Negative for blurred vision.  Respiratory: Positive for cough (nonproductive). Negative for chest tightness and shortness of  breath.   Cardiovascular: Negative for chest pain, palpitations and leg swelling.  Gastrointestinal: Negative.   Endocrine: Negative for polydipsia, polyphagia and polyuria.  Genitourinary: Negative.   Musculoskeletal: Negative for myalgias.  Neurological: Negative.  Negative for headaches.  Psychiatric/Behavioral: Negative.   All other systems reviewed and are negative.      Objective:   Physical Exam  Constitutional: She is oriented to person, place, and time. She appears well-developed and well-nourished.  HENT:  Nose: Nose normal.  Mouth/Throat: Oropharynx is clear and moist.  Eyes: EOM are normal.  Neck: Trachea normal, normal range of motion and full passive range of motion without pain. Neck supple. No JVD present. Carotid bruit is not present. No thyromegaly present.  Cardiovascular: Normal rate, regular rhythm, normal heart sounds and intact distal pulses.  Exam reveals no gallop and no friction rub.   No murmur heard. Pulmonary/Chest: Effort normal and breath sounds normal.  Abdominal: Soft. Bowel sounds are normal. She exhibits no distension and no mass. There is no tenderness.  Genitourinary:  Pelvic exam no adnexal masses or tenderness  Musculoskeletal: Normal range of motion.  Lymphadenopathy:    She has no cervical adenopathy.  Neurological: She is alert and oriented to person, place, and time. She has normal reflexes.  Skin: Skin is warm and dry.  Cholesterol deposit left eye lid  Psychiatric: She has a normal mood and affect. Her behavior is normal. Judgment and thought content normal.    BP 142/88 mmHg  Pulse 93  Temp(Src) 97.2 F (36.2 C) (Oral)  Ht 5' 4" (1.626 m)  Wt 207 lb (93.895 kg)  BMI 35.51 kg/m2  LMP 01/23/2001 (Approximate)   Results for orders placed or performed in visit on 01/19/15  POCT glycosylated hemoglobin (  Hb A1C)  Result Value Ref Range   Hemoglobin A1C 8.0             Assessment & Plan:  1. Essential hypertension Do  not add salt to diet - CMP14+EGFR  2. Hyperlipidemia with target LDL less than 100 Low fat diet - Lipid panel  3. Elevated blood sugar - POCT glycosylated hemoglobin (Hb A1C)  4. Morbid obesity, unspecified obesity type ([place]) Discussed diet and exercise for person with BMI >25 Will recheck weight in 3-6 months  5. Seizures ([place]) Keep follow up with neurologist  6. Type 2 diabetes mellitus with diabetic mononeuropathy, without long-term current use of insulin ([place]) New diabetic appointment with clinical pharmacist Given onetouch verio monitor - metFORMIN (GLUCOPHAGE) 500 MG tablet; Take 1 tablet (500 mg total) by mouth 2 (two) times daily with a meal.  Dispense: 60 tablet; Refill: 5    Labs pending Health maintenance reviewed Diet and exercise encouraged Continue all meds Follow up  In 3 months   [place], FNP

## 2015-01-19 NOTE — Patient Instructions (Signed)

## 2015-01-20 LAB — CMP14+EGFR
A/G RATIO: 1.6 (ref 1.1–2.5)
ALBUMIN: 4.1 g/dL (ref 3.5–5.5)
ALT: 11 IU/L (ref 0–32)
AST: 14 IU/L (ref 0–40)
Alkaline Phosphatase: 212 IU/L — ABNORMAL HIGH (ref 39–117)
BUN / CREAT RATIO: 14 (ref 9–23)
BUN: 13 mg/dL (ref 6–24)
CHLORIDE: 101 mmol/L (ref 97–106)
CO2: 26 mmol/L (ref 18–29)
Calcium: 9.1 mg/dL (ref 8.7–10.2)
Creatinine, Ser: 0.95 mg/dL (ref 0.57–1.00)
GFR calc Af Amer: 79 mL/min/{1.73_m2} (ref >59)
GFR calc non Af Amer: 68 mL/min/{1.73_m2} (ref >59)
Globulin, Total: 2.6 g/dL (ref 1.5–4.5)
Glucose: 164 mg/dL — ABNORMAL HIGH (ref 65–99)
POTASSIUM: 3.9 mmol/L (ref 3.5–5.2)
Sodium: 142 mmol/L (ref 136–144)
Total Protein: 6.7 g/dL (ref 6.0–8.5)

## 2015-01-20 LAB — MICROALBUMIN / CREATININE URINE RATIO
CREATININE, UR: 235.9 mg/dL
MICROALB/CREAT RATIO: 9.2 mg/g creat (ref 0.0–30.0)
MICROALBUM., U, RANDOM: 21.8 ug/mL

## 2015-01-20 LAB — LIPID PANEL
Chol/HDL Ratio: 3 ratio units (ref 0.0–4.4)
Cholesterol, Total: 142 mg/dL (ref 100–199)
HDL: 47 mg/dL (ref >39)
LDL Calculated: 61 mg/dL (ref 0–99)
Triglycerides: 169 mg/dL — ABNORMAL HIGH (ref 0–149)
VLDL Cholesterol Cal: 34 mg/dL (ref 5–40)

## 2015-01-20 LAB — HEPATITIS C ANTIBODY

## 2015-01-26 ENCOUNTER — Telehealth: Payer: Self-pay | Admitting: Nurse Practitioner

## 2015-01-26 NOTE — Telephone Encounter (Signed)
Patient states that since starting the metformin she has had diarrhea. Patient advised this is a side effect but wants to see what you recommend.

## 2015-01-26 NOTE — Telephone Encounter (Addendum)
Patient aware to cut metformin back to one daily and to call back if she needs anything

## 2015-01-26 NOTE — Telephone Encounter (Signed)
Try cutting back tp one a day and see if helps

## 2015-02-07 ENCOUNTER — Ambulatory Visit: Payer: BLUE CROSS/BLUE SHIELD | Admitting: Pharmacist

## 2015-02-07 ENCOUNTER — Encounter: Payer: Self-pay | Admitting: Pharmacist

## 2015-02-07 ENCOUNTER — Ambulatory Visit (INDEPENDENT_AMBULATORY_CARE_PROVIDER_SITE_OTHER): Payer: BLUE CROSS/BLUE SHIELD | Admitting: Pharmacist

## 2015-02-07 VITALS — BP 134/90 | HR 68 | Ht 64.0 in | Wt 202.0 lb

## 2015-02-07 DIAGNOSIS — E669 Obesity, unspecified: Secondary | ICD-10-CM

## 2015-02-07 DIAGNOSIS — E785 Hyperlipidemia, unspecified: Secondary | ICD-10-CM

## 2015-02-07 DIAGNOSIS — E119 Type 2 diabetes mellitus without complications: Secondary | ICD-10-CM

## 2015-02-07 DIAGNOSIS — Z716 Tobacco abuse counseling: Secondary | ICD-10-CM | POA: Diagnosis not present

## 2015-02-07 DIAGNOSIS — I10 Essential (primary) hypertension: Secondary | ICD-10-CM

## 2015-02-07 MED ORDER — METFORMIN HCL ER 500 MG PO TB24: 500.0000 mg | ORAL_TABLET | Freq: Every evening | ORAL | Status: DC

## 2015-02-07 NOTE — Progress Notes (Signed)
Subjective:    Melanie Maynard is a 54 y.o. female who presents for an initial evaluation of Type 2 diabetes mellitus.  Melanie Maynard was diagnosed with type 2 DM 01/19/2015.  A1c was found to be 7.5%.  She started metformin 500mg  bid but has diarrhea and dose was decreased to 500mg  qd.  She still c/o diarrhea daily.   At time if diagnosis patient c/o neuropathy in left leg / foot.  She states that this has improved some  Known diabetic complications: peripheral neuropathy Cardiovascular risk factors: advanced age (older than 5 for men, 47 for women), diabetes mellitus, dyslipidemia, family history of premature cardiovascular disease, hypertension, obesity (BMI >= 30 kg/m2), sedentary lifestyle and smoking/ tobacco exposure  Eye exam current (within one year): yes - last exam was 03/2015 but this was prior to diabetes diagnosis.  Patient is due to have eyes rechecked 03/2015 Weight trend: patient has lost 5# since last visit / initial diagnosisi Prior visit with dietician: no Current diet: in general, an "unhealthy" diet;  patient reports drinking Mt Dew regularly Current exercise: none  Current monitoring regimen: home blood tests - one to two times daily Home blood sugar records: 7 day avg = 117; 14 day avg = 118; 30 day avg = 140 Any episodes of hypoglycemia? no  Is She on ACE inhibitor or angiotensin II receptor blocker?  Yes    losartan (Cozaar) 100mg  1 tablet daily  The following portions of the patient's history were reviewed and updated as appropriate: allergies, current medications, past family history, past medical history, past social history, past surgical history and problem list.    Objective:    LMP 01/23/2001 (Approximate)  A1c = 7.5% (01/19/2015)   Lab Review GLUCOSE (mg/dL)  Date Value  01/19/2015 164*  07/03/2014 94  01/23/2014 95   GLUCOSE, BLD (mg/dL)  Date Value  09/22/2012 88   CO2 (mmol/L)  Date Value  01/19/2015 26  07/03/2014 25  01/23/2014 25    BUN (mg/dL)  Date Value  01/19/2015 13  07/03/2014 12  01/23/2014 13  09/22/2012 15   CREAT (mg/dL)  Date Value  09/22/2012 0.77   CREATININE, SER (mg/dL)  Date Value  01/19/2015 0.95  07/03/2014 0.69  01/23/2014 0.78    Assessment:    Diabetes Mellitus type II, under improving control.   HTN - was at goal today - patient is taking ARB Hyperlipidemia - LDL at goal but Triglycerides were elevated - likely related to elevated BG Obesity - decrease in weight by 5# in last 2-3 weeks Tobacco Abuse - patient states she is not ready to quit  Plan:    1.  Rx changes: change to metformin ER 500mg  - take 1 tablet with largest meal / evening meal.  If diarrhea doesn't improved then will consider alternative therapy - SGL2, GLP1 type agent  2.  Education: Reviewed 'ABCs' of diabetes management (respective goals in parentheses):  A1C (<7), blood pressure (<130/80) 3.  Continue to check BG up to twice a day - discussed BG goals and handout given regarding BG goals. 4.  Discussed complications related to type 2 DM and how to manage / prevent.  5.  Discussed CHO limiting diet in depth (about 30 minutes) - goal CHO per meal is 45 to 50 grams and per snack is 15 to 20 grams.   Discussed limiting portion sizes.  Also discussed cutting out any sugar containing beverages. 6.  Discussed smoking and adverse healthy effects especially with diabetics.  Patient states she is not ready to quit at this time.  She is encouraged to notifiy office for assistance if she changes her mind.   RTC in January 2017 so follow up with PCP  Cherre Robins, PharmD, CPP, CDE

## 2015-04-10 ENCOUNTER — Other Ambulatory Visit: Payer: Self-pay | Admitting: Nurse Practitioner

## 2015-04-24 ENCOUNTER — Encounter: Payer: Self-pay | Admitting: Nurse Practitioner

## 2015-04-24 ENCOUNTER — Ambulatory Visit (INDEPENDENT_AMBULATORY_CARE_PROVIDER_SITE_OTHER): Payer: BLUE CROSS/BLUE SHIELD | Admitting: Nurse Practitioner

## 2015-04-24 VITALS — BP 136/84 | HR 82 | Temp 97.0°F | Ht 64.0 in | Wt 204.0 lb

## 2015-04-24 DIAGNOSIS — E785 Hyperlipidemia, unspecified: Secondary | ICD-10-CM | POA: Diagnosis not present

## 2015-04-24 DIAGNOSIS — G5791 Unspecified mononeuropathy of right lower limb: Secondary | ICD-10-CM | POA: Diagnosis not present

## 2015-04-24 DIAGNOSIS — R569 Unspecified convulsions: Secondary | ICD-10-CM | POA: Diagnosis not present

## 2015-04-24 DIAGNOSIS — I10 Essential (primary) hypertension: Secondary | ICD-10-CM | POA: Diagnosis not present

## 2015-04-24 DIAGNOSIS — E119 Type 2 diabetes mellitus without complications: Secondary | ICD-10-CM | POA: Diagnosis not present

## 2015-04-24 DIAGNOSIS — M792 Neuralgia and neuritis, unspecified: Secondary | ICD-10-CM

## 2015-04-24 LAB — POCT GLYCOSYLATED HEMOGLOBIN (HGB A1C): Hemoglobin A1C: 7.3

## 2015-04-24 MED ORDER — METFORMIN HCL ER 500 MG PO TB24: 500.0000 mg | ORAL_TABLET | Freq: Every evening | ORAL | Status: DC

## 2015-04-24 MED ORDER — GABAPENTIN 300 MG PO CAPS: 300.0000 mg | ORAL_CAPSULE | Freq: Three times a day (TID) | ORAL | Status: DC

## 2015-04-24 MED ORDER — AMLODIPINE BESYLATE 5 MG PO TABS: 5.0000 mg | ORAL_TABLET | Freq: Every day | ORAL | Status: DC

## 2015-04-24 MED ORDER — GLUCOSE BLOOD VI STRP: ORAL_STRIP | Status: DC

## 2015-04-24 MED ORDER — PHENYTOIN SODIUM EXTENDED 100 MG PO CAPS: 300.0000 mg | ORAL_CAPSULE | Freq: Every day | ORAL | Status: DC

## 2015-04-24 MED ORDER — ATORVASTATIN CALCIUM 40 MG PO TABS: 40.0000 mg | ORAL_TABLET | Freq: Every day | ORAL | Status: DC

## 2015-04-24 MED ORDER — ONETOUCH DELICA LANCETS 33G MISC: 1.0000 | Freq: Every day | Status: DC

## 2015-04-24 MED ORDER — LOSARTAN POTASSIUM 100 MG PO TABS: 100.0000 mg | ORAL_TABLET | Freq: Every day | ORAL | Status: DC

## 2015-04-24 NOTE — Patient Instructions (Signed)

## 2015-04-24 NOTE — Progress Notes (Signed)
Subjective:    Patient ID: Melanie Maynard, female    DOB: [birth year], 55 y.o.   MRN: [id number]  Patient here today for follow up of chronic medical problems.  Outpatient Encounter Prescriptions as of 04/24/2015  Medication Sig  . amLODipine (NORVASC) 5 MG tablet Take 1 tablet (5 mg total) by mouth daily.  Marland Kitchen atorvastatin (LIPITOR) 40 MG tablet TAKE ONE TABLET BY MOUTH ONCE DAILY  . cholecalciferol (VITAMIN D) 1000 UNITS tablet Take 2,000 Units by mouth daily.  Marland Kitchen glucose blood (ONETOUCH VERIO) test strip Test 1x per day and prn  Dx e11.9  . losartan (COZAAR) 100 MG tablet Take 1 tablet (100 mg total) by mouth daily.  . metFORMIN (GLUCOPHAGE XR) 500 MG 24 hr tablet Take 1 tablet (500 mg total) by mouth every evening. With your evening meal  . ONETOUCH DELICA LANCETS 43P MISC 1 each by Does not apply route daily. Test 1x per day and prn  Dx E11.9  . phenytoin (DILANTIN) 100 MG ER capsule Take 3 capsules (300 mg total) by mouth at bedtime.   No facility-administered encounter medications on file as of 04/24/2015.    * Saw clinical pharmacist in November and metformin was cut back 575m 1x a day because of diarrhea- no change in blood sugars.    Hypertension This is a chronic problem. The current episode started more than 1 year ago. The problem is unchanged. The problem is uncontrolled. Pertinent negatives include no blurred vision, chest pain, headaches, palpitations, peripheral edema, shortness of breath or sweats. There are no associated agents to hypertension. Risk factors for coronary artery disease include dyslipidemia and obesity. Past treatments include calcium channel blockers and ACE inhibitors. The current treatment provides mild improvement. Compliance problems include diet and exercise.   Hyperlipidemia This is a chronic problem. The current episode started more than 1 year ago. The problem is uncontrolled. Recent lipid tests were reviewed and are high. Exacerbating diseases include  obesity. She has no history of diabetes or hypothyroidism. Pertinent negatives include no chest pain, myalgias or shortness of breath. Current antihyperlipidemic treatment includes statins. The current treatment provides mild improvement of lipids. Compliance problems include adherence to diet and adherence to exercise.  Risk factors for coronary artery disease include dyslipidemia, hypertension, obesity and post-menopausal.  seizures Has had for over 20 years- currently on dilantin- last seizure was over 14 years ago- sees neurologist 1X per year Left foot neuropathy  Has been increasing in pain- worse when driving- is currently not taking anything- would like to try something.    Review of Systems  Constitutional: Negative.  Negative for fever, chills, appetite change and fatigue.  HENT: Negative for congestion and postnasal drip.   Eyes: Negative for blurred vision.  Respiratory: Positive for cough (nonproductive). Negative for chest tightness and shortness of breath.   Cardiovascular: Negative for chest pain, palpitations and leg swelling.  Gastrointestinal: Negative.   Endocrine: Negative for polydipsia, polyphagia and polyuria.  Genitourinary: Negative.   Musculoskeletal: Negative for myalgias.  Neurological: Negative.  Negative for headaches.  Psychiatric/Behavioral: Negative.   All other systems reviewed and are negative.      Objective:   Physical Exam  Constitutional: She is oriented to person, place, and time. She appears well-developed and well-nourished.  HENT:  Nose: Nose normal.  Mouth/Throat: Oropharynx is clear and moist.  Eyes: EOM are normal.  Neck: Trachea normal, normal range of motion and full passive range of motion without pain. Neck supple. No JVD present.  Carotid bruit is not present. No thyromegaly present.  Cardiovascular: Normal rate, regular rhythm, normal heart sounds and intact distal pulses.  Exam reveals no gallop and no friction rub.   No murmur  heard. Pulmonary/Chest: Effort normal and breath sounds normal.  Abdominal: Soft. Bowel sounds are normal. She exhibits no distension and no mass. There is no tenderness.  Musculoskeletal: Normal range of motion.  Lymphadenopathy:    She has no cervical adenopathy.  Neurological: She is alert and oriented to person, place, and time. She has normal reflexes.  Skin: Skin is warm and dry.  Cholesterol deposit left eye lid  Psychiatric: She has a normal mood and affect. Her behavior is normal. Judgment and thought content normal.    BP 136/84 mmHg  Pulse 82  Temp(Src) 97 F (36.1 C) (Oral)  Ht _0  (1.626 m)  Wt 204 lb (92.534 kg)  BMI 35.00 kg/m2  LMP 01/23/2001 (Approximate)  Results for orders placed or performed in visit on 04/24/15  POCT glycosylated hemoglobin (Hb A1C)  Result Value Ref Range   Hemoglobin A1C 7.3        Assessment & Plan:  1. Type 2 diabetes mellitus treated without insulin (HCC) Continue to watch carbs in diet - POCT glycosylated hemoglobin (Hb W1U) - ONETOUCH DELICA LANCETS 93A MISC; 1 each by Does not apply route daily. Test 1x per day and prn  Dx E11.9  Dispense: 100 each; Refill: 3 - glucose blood (ONETOUCH VERIO) test strip; Test 1x per day and prn  Dx e11.9  Dispense: 100 each; Refill: 12  2. Essential hypertension Do not add salt to deit - CMP14+EGFR - losartan (COZAAR) 100 MG tablet; Take 1 tablet (100 mg total) by mouth daily.  Dispense: 90 tablet; Refill: 1 - amLODipine (NORVASC) 5 MG tablet; Take 1 tablet (5 mg total) by mouth daily.  Dispense: 90 tablet; Refill: 1  3. Hyperlipidemia with target LDL less than 100 Low fat diet - Lipid panel  4. Neuralgia of right lower extremity Do not o barefooted - gabapentin (NEURONTIN) 300 MG capsule; Take 1 capsule (300 mg total) by mouth 3 (three) times daily.  Dispense: 90 capsule; Refill: 3  5. Seizures (HCC) - phenytoin (DILANTIN) 100 MG ER capsule; Take 3 capsules (300 mg total) by mouth at  bedtime.  Dispense: 270 capsule; Refill: 3  6. Morbid obesity, unspecified obesity type ([place]) Discussed diet and exercise for person with BMI >25 Will recheck weight in 3-6 months     Labs pending Health maintenance reviewed Diet and exercise encouraged Continue all meds Follow up  In 3 months   [place], FNP

## 2015-04-25 LAB — CMP14+EGFR
ALBUMIN: 4 g/dL (ref 3.5–5.5)
ALK PHOS: 214 IU/L — AB (ref 39–117)
ALT: 11 IU/L (ref 0–32)
AST: 9 IU/L (ref 0–40)
Albumin/Globulin Ratio: 1.7 (ref 1.1–2.5)
BILIRUBIN TOTAL: 0.3 mg/dL (ref 0.0–1.2)
BUN / CREAT RATIO: 19 (ref 9–23)
BUN: 15 mg/dL (ref 6–24)
CHLORIDE: 100 mmol/L (ref 96–106)
CO2: 26 mmol/L (ref 18–29)
CREATININE: 0.8 mg/dL (ref 0.57–1.00)
Calcium: 9.1 mg/dL (ref 8.7–10.2)
GFR calc Af Amer: 97 mL/min/{1.73_m2} (ref >59)
GFR calc non Af Amer: 84 mL/min/{1.73_m2} (ref >59)
GLUCOSE: 128 mg/dL — AB (ref 65–99)
Globulin, Total: 2.4 g/dL (ref 1.5–4.5)
Potassium: 3.9 mmol/L (ref 3.5–5.2)
Sodium: 140 mmol/L (ref 134–144)
TOTAL PROTEIN: 6.4 g/dL (ref 6.0–8.5)

## 2015-04-25 LAB — LIPID PANEL
CHOLESTEROL TOTAL: 132 mg/dL (ref 100–199)
Chol/HDL Ratio: 2.6 ratio units (ref 0.0–4.4)
HDL: 50 mg/dL (ref >39)
LDL CALC: 60 mg/dL (ref 0–99)
Triglycerides: 111 mg/dL (ref 0–149)
VLDL CHOLESTEROL CAL: 22 mg/dL (ref 5–40)

## 2015-06-08 LAB — HM MAMMOGRAPHY

## 2015-07-04 ENCOUNTER — Encounter: Payer: Self-pay | Admitting: *Deleted

## 2015-07-24 ENCOUNTER — Ambulatory Visit (INDEPENDENT_AMBULATORY_CARE_PROVIDER_SITE_OTHER): Payer: BLUE CROSS/BLUE SHIELD | Admitting: Nurse Practitioner

## 2015-07-24 ENCOUNTER — Encounter: Payer: Self-pay | Admitting: Nurse Practitioner

## 2015-07-24 VITALS — BP 138/86 | HR 77 | Temp 97.2°F | Ht 64.0 in | Wt 201.0 lb

## 2015-07-24 DIAGNOSIS — Z6834 Body mass index (BMI) 34.0-34.9, adult: Secondary | ICD-10-CM

## 2015-07-24 DIAGNOSIS — G5791 Unspecified mononeuropathy of right lower limb: Secondary | ICD-10-CM

## 2015-07-24 DIAGNOSIS — R569 Unspecified convulsions: Secondary | ICD-10-CM | POA: Diagnosis not present

## 2015-07-24 DIAGNOSIS — Z1212 Encounter for screening for malignant neoplasm of rectum: Secondary | ICD-10-CM | POA: Diagnosis not present

## 2015-07-24 DIAGNOSIS — I10 Essential (primary) hypertension: Secondary | ICD-10-CM | POA: Diagnosis not present

## 2015-07-24 DIAGNOSIS — E119 Type 2 diabetes mellitus without complications: Secondary | ICD-10-CM

## 2015-07-24 DIAGNOSIS — E785 Hyperlipidemia, unspecified: Secondary | ICD-10-CM | POA: Diagnosis not present

## 2015-07-24 DIAGNOSIS — M792 Neuralgia and neuritis, unspecified: Secondary | ICD-10-CM

## 2015-07-24 DIAGNOSIS — E559 Vitamin D deficiency, unspecified: Secondary | ICD-10-CM

## 2015-07-24 LAB — BAYER DCA HB A1C WAIVED: HB A1C: 6.8 % (ref <7.0)

## 2015-07-24 MED ORDER — LOSARTAN POTASSIUM 100 MG PO TABS: 100.0000 mg | ORAL_TABLET | Freq: Every day | ORAL | Status: DC

## 2015-07-24 MED ORDER — GABAPENTIN 300 MG PO CAPS: 300.0000 mg | ORAL_CAPSULE | Freq: Three times a day (TID) | ORAL | Status: DC

## 2015-07-24 MED ORDER — CHOLECALCIFEROL 50 MCG (2000 UT) PO CAPS: 1.0000 | ORAL_CAPSULE | Freq: Every day | ORAL | Status: DC

## 2015-07-24 MED ORDER — METFORMIN HCL ER 500 MG PO TB24
500.0000 mg | ORAL_TABLET | Freq: Every evening | ORAL | Status: DC
Start: 2015-07-24 — End: 2016-02-25

## 2015-07-24 MED ORDER — ATORVASTATIN CALCIUM 40 MG PO TABS: 40.0000 mg | ORAL_TABLET | Freq: Every day | ORAL | Status: DC

## 2015-07-24 MED ORDER — AMLODIPINE BESYLATE 5 MG PO TABS: 5.0000 mg | ORAL_TABLET | Freq: Every day | ORAL | Status: DC

## 2015-07-24 MED ORDER — PHENYTOIN SODIUM EXTENDED 100 MG PO CAPS: 300.0000 mg | ORAL_CAPSULE | Freq: Every day | ORAL | Status: DC

## 2015-07-24 NOTE — Progress Notes (Signed)
Subjective:    Patient ID: Melanie Maynard, female    DOB: January 21, 1961, 55 y.o.   MRN: [id number]  Patient here today for follow up of chronic medical problems.  Outpatient Encounter Prescriptions as of 07/24/2015  Medication Sig  . amLODipine (NORVASC) 5 MG tablet Take 1 tablet (5 mg total) by mouth daily.  Marland Kitchen atorvastatin (LIPITOR) 40 MG tablet Take 1 tablet (40 mg total) by mouth daily.  . cholecalciferol (VITAMIN D) 1000 UNITS tablet Take 2,000 Units by mouth daily.  Marland Kitchen glucose blood (ONETOUCH VERIO) test strip Test 1x per day and prn  Dx e11.9  . losartan (COZAAR) 100 MG tablet Take 1 tablet (100 mg total) by mouth daily.  . metFORMIN (GLUCOPHAGE XR) 500 MG 24 hr tablet Take 1 tablet (500 mg total) by mouth every evening. With your evening meal  . ONETOUCH DELICA LANCETS 97L MISC 1 each by Does not apply route daily. Test 1x per day and prn  Dx E11.9  . phenytoin (DILANTIN) 100 MG ER capsule Take 3 capsules (300 mg total) by mouth at bedtime.  . gabapentin (NEURONTIN) 300 MG capsule Take 1 capsule (300 mg total) by mouth 3 (three) times daily. (Patient not taking: Reported on 07/24/2015)   No facility-administered encounter medications on file as of 07/24/2015.     Hypertension This is a chronic problem. The current episode started more than 1 year ago. The problem is unchanged. The problem is uncontrolled. Pertinent negatives include no blurred vision, chest pain, headaches, palpitations, peripheral edema, shortness of breath or sweats. There are no associated agents to hypertension. Risk factors for coronary artery disease include dyslipidemia and obesity. Past treatments include calcium channel blockers and ACE inhibitors. The current treatment provides mild improvement. Compliance problems include diet and exercise.   Hyperlipidemia This is a chronic problem. The current episode started more than 1 year ago. The problem is uncontrolled. Recent lipid tests were reviewed and are high.  Exacerbating diseases include obesity. She has no history of diabetes or hypothyroidism. Pertinent negatives include no chest pain, myalgias or shortness of breath. Current antihyperlipidemic treatment includes statins. The current treatment provides mild improvement of lipids. Compliance problems include adherence to diet and adherence to exercise.  Risk factors for coronary artery disease include dyslipidemia, hypertension, obesity and post-menopausal.  seizures Has had for over 20 years- currently on dilantin- last seizure was over 14 years ago- sees neurologist 1X per year Left foot neuropathy  Has been increasing in pain- worse when driving- is currently not taking anything- would like to try something.    Review of Systems  Constitutional: Negative.  Negative for fever, chills, appetite change and fatigue.  HENT: Negative for congestion and postnasal drip.   Eyes: Negative for blurred vision.  Respiratory: Positive for cough (nonproductive). Negative for chest tightness and shortness of breath.   Cardiovascular: Negative for chest pain, palpitations and leg swelling.  Gastrointestinal: Negative.   Endocrine: Negative for polydipsia, polyphagia and polyuria.  Genitourinary: Negative.   Musculoskeletal: Negative for myalgias.  Neurological: Negative.  Negative for headaches.  Psychiatric/Behavioral: Negative.   All other systems reviewed and are negative.      Objective:   Physical Exam  Constitutional: She is oriented to person, place, and time. She appears well-developed and well-nourished.  HENT:  Nose: Nose normal.  Mouth/Throat: Oropharynx is clear and moist.  Eyes: EOM are normal.  Neck: Trachea normal, normal range of motion and full passive range of motion without pain. Neck supple. No  JVD present. Carotid bruit is not present. No thyromegaly present.  Cardiovascular: Normal rate, regular rhythm, normal heart sounds and intact distal pulses.  Exam reveals no gallop and no  friction rub.   No murmur heard. Pulmonary/Chest: Effort normal and breath sounds normal.  Abdominal: Soft. Bowel sounds are normal. She exhibits no distension and no mass. There is no tenderness.  Musculoskeletal: Normal range of motion.  Lymphadenopathy:    She has no cervical adenopathy.  Neurological: She is alert and oriented to person, place, and time. She has normal reflexes.  Skin: Skin is warm and dry.  Cholesterol deposit left eye lid  Psychiatric: She has a normal mood and affect. Her behavior is normal. Judgment and thought content normal.   BP 138/86 mmHg  Pulse 77  Temp(Src) 97.2 F (36.2 C) (Oral)  Ht _0  (1.626 m)  Wt 201 lb (91.173 kg)  BMI 34.48 kg/m2  LMP 01/23/2001 (Approximate)   EKG- NSR-[name] Done, FNP    Hgba1c 6.8%- down from 7.3% at last visit     Assessment & Plan:  1. Hyperlipidemia with target LDL less than 100 Low fat diet - Lipid panel - CMP14+EGFR - atorvastatin (LIPITOR) 40 MG tablet; Take 1 tablet (40 mg total) by mouth daily.  Dispense: 90 tablet; Refill: 1  2. Essential hypertension Do not add salt to diet - CMP14+EGFR - losartan (COZAAR) 100 MG tablet; Take 1 tablet (100 mg total) by mouth daily.  Dispense: 90 tablet; Refill: 1 - amLODipine (NORVASC) 5 MG tablet; Take 1 tablet (5 mg total) by mouth daily.  Dispense: 90 tablet; Refill: 1 - EKG 12-Lead  3. Type 2 diabetes mellitus treated without insulin (HCC) Continue carb counting - Bayer DCA Hb A1c Waived - CMP14+EGFR - metFORMIN (GLUCOPHAGE XR) 500 MG 24 hr tablet; Take 1 tablet (500 mg total) by mouth every evening. With your evening meal  Dispense: 90 tablet; Refill: 1  4. Seizures ([place]) Follow up with neurologist yearly - phenytoin (DILANTIN) 100 MG ER capsule; Take 3 capsules (300 mg total) by mouth at bedtime.  Dispense: 270 capsule; Refill: 3  5. BMI 34.0-34.9,adult Discussed diet and exercise for person with BMI >25 Will recheck weight in 3-6  months   6. Neuralgia of right lower extremity Can't tolerate gabapentin- does not want anything right now  7. Screening for malignant neoplasm of the rectum - Fecal occult blood, imunochemical; Future  8. Vitamin D deficiency - Cholecalciferol 2000 units CAPS; Take 1 capsule (2,000 Units total) by mouth daily.  Dispense: 30 each; Refill: 5 - VITAMIN D 25 Hydroxy (Vit-D Deficiency, Fractures)    Labs pending Health maintenance reviewed Diet and exercise encouraged Continue all meds Follow up  In 3 months   [place], FNP

## 2015-07-24 NOTE — Patient Instructions (Signed)
Diabetes and Foot Care Diabetes may cause you to have problems because of poor blood supply (circulation) to your feet and legs. This may cause the skin on your feet to become thinner, break easier, and heal more slowly. Your skin may become dry, and the skin may peel and crack. You may also have nerve damage in your legs and feet causing decreased feeling in them. You may not notice minor injuries to your feet that could lead to infections or more serious problems. Taking care of your feet is one of the most important things you can do for yourself.  HOME CARE INSTRUCTIONS  Wear shoes at all times, even in the house. Do not go barefoot. Bare feet are easily injured.  Check your feet daily for blisters, cuts, and redness. If you cannot see the bottom of your feet, use a mirror or ask someone for help.  Wash your feet with warm water (do not use hot water) and mild soap. Then pat your feet and the areas between your toes until they are completely dry. Do not soak your feet as this can dry your skin.  Apply a moisturizing lotion or petroleum jelly (that does not contain alcohol and is unscented) to the skin on your feet and to dry, brittle toenails. Do not apply lotion between your toes.  Trim your toenails straight across. Do not dig under them or around the cuticle. File the edges of your nails with an emery board or nail file.  Do not cut corns or calluses or try to remove them with medicine.  Wear clean socks or stockings every day. Make sure they are not too tight. Do not wear knee-high stockings since they may decrease blood flow to your legs.  Wear shoes that fit properly and have enough cushioning. To break in new shoes, wear them for just a few hours a day. This prevents you from injuring your feet. Always look in your shoes before you put them on to be sure there are no objects inside.  Do not cross your legs. This may decrease the blood flow to your feet.  If you find a minor scrape,  cut, or break in the skin on your feet, keep it and the skin around it clean and dry. These areas may be cleansed with mild soap and water. Do not cleanse the area with peroxide, alcohol, or iodine.  When you remove an adhesive bandage, be sure not to damage the skin around it.  If you have a wound, look at it several times a day to make sure it is healing.  Do not use heating pads or hot water bottles. They may burn your skin. If you have lost feeling in your feet or legs, you may not know it is happening until it is too late.  Make sure your health care provider performs a complete foot exam at least annually or more often if you have foot problems. Report any cuts, sores, or bruises to your health care provider immediately. SEEK MEDICAL CARE IF:   You have an injury that is not healing.  You have cuts or breaks in the skin.  You have an ingrown nail.  You notice redness on your legs or feet.  You feel burning or tingling in your legs or feet.  You have pain or cramps in your legs and feet.  Your legs or feet are numb.  Your feet always feel cold. SEEK IMMEDIATE MEDICAL CARE IF:   There is increasing redness,   swelling, or pain in or around a wound.  There is a red line that goes up your leg.  Pus is coming from a wound.  You develop a fever or as directed by your health care provider.  You notice a bad smell coming from an ulcer or wound.   This information is not intended to replace advice given to you by your health care provider. Make sure you discuss any questions you have with your health care provider.   Document Released: 03/07/2000 Document Revised: 11/10/2012 Document Reviewed: 08/17/2012 Elsevier Interactive Patient Education 2016 [employer] Inc[employer]  

## 2015-07-27 LAB — CMP14+EGFR
A/G RATIO: 1.5 (ref 1.2–2.2)
ALBUMIN: 4.1 g/dL (ref 3.5–5.5)
ALT: 13 IU/L (ref 0–32)
AST: 10 IU/L (ref 0–40)
Alkaline Phosphatase: 218 IU/L — ABNORMAL HIGH (ref 39–117)
BUN / CREAT RATIO: 16 (ref 9–23)
BUN: 14 mg/dL (ref 6–24)
Bilirubin Total: 0.3 mg/dL (ref 0.0–1.2)
CALCIUM: 9.1 mg/dL (ref 8.7–10.2)
CO2: 23 mmol/L (ref 18–29)
Chloride: 102 mmol/L (ref 96–106)
Creatinine, Ser: 0.86 mg/dL (ref 0.57–1.00)
GFR, EST AFRICAN AMERICAN: 88 mL/min/{1.73_m2} (ref >59)
GFR, EST NON AFRICAN AMERICAN: 76 mL/min/{1.73_m2} (ref >59)
Globulin, Total: 2.8 g/dL (ref 1.5–4.5)
Glucose: 103 mg/dL — ABNORMAL HIGH (ref 65–99)
POTASSIUM: 4.1 mmol/L (ref 3.5–5.2)
Sodium: 142 mmol/L (ref 134–144)
TOTAL PROTEIN: 6.9 g/dL (ref 6.0–8.5)

## 2015-07-27 LAB — LIPID PANEL
CHOL/HDL RATIO: 3.2 ratio (ref 0.0–4.4)
CHOLESTEROL TOTAL: 156 mg/dL (ref 100–199)
HDL: 49 mg/dL (ref >39)
LDL Calculated: 71 mg/dL (ref 0–99)
TRIGLYCERIDES: 178 mg/dL — AB (ref 0–149)
VLDL Cholesterol Cal: 36 mg/dL (ref 5–40)

## 2015-07-27 LAB — VITAMIN D 25 HYDROXY (VIT D DEFICIENCY, FRACTURES): Vit D, 25-Hydroxy: 16.5 ng/mL — ABNORMAL LOW (ref 30.0–100.0)

## 2015-07-31 DIAGNOSIS — B351 Tinea unguium: Secondary | ICD-10-CM | POA: Diagnosis not present

## 2015-07-31 DIAGNOSIS — E1142 Type 2 diabetes mellitus with diabetic polyneuropathy: Secondary | ICD-10-CM | POA: Diagnosis not present

## 2015-10-09 ENCOUNTER — Encounter: Payer: Self-pay | Admitting: Nurse Practitioner

## 2015-10-09 ENCOUNTER — Ambulatory Visit (INDEPENDENT_AMBULATORY_CARE_PROVIDER_SITE_OTHER): Payer: BLUE CROSS/BLUE SHIELD | Admitting: Nurse Practitioner

## 2015-10-09 VITALS — BP 150/82 | HR 72 | Ht 64.0 in | Wt 200.6 lb

## 2015-10-09 DIAGNOSIS — R569 Unspecified convulsions: Secondary | ICD-10-CM | POA: Diagnosis not present

## 2015-10-09 DIAGNOSIS — I10 Essential (primary) hypertension: Secondary | ICD-10-CM

## 2015-10-09 MED ORDER — PHENYTOIN SODIUM EXTENDED 100 MG PO CAPS: 300.0000 mg | ORAL_CAPSULE | Freq: Every day | ORAL | Status: DC

## 2015-10-09 NOTE — Progress Notes (Signed)
[facility]  PATIENT: Melanie Maynard DOB: [birth year]   REASON FOR VISIT: Follow-up for seizure disorder HISTORY FROM: Patient    HISTORY OF PRESENT ILLNESS:Ms. Melanie Maynard, 55 year old black female returns for followup. She was last seen 10/09/14. She has a history of seizure disorder, last seizure was approximately 21 years ago. She is currently on Dilantin without side effects. She denies daytime drowsiness or feelings of being off balance. She has had no evidence of bowel or bladder incontinence, no tongue biting, no jerking of extremities. Sleeping well, appetite is reportedly good. Medical history also includes hypertension and elevated cholesterol. Blood pressure noted to be elevated today, she says she has not taken her  medication .Recent labs at primary care in May reviewed.  She returns for reevaluation and refills   REVIEW OF SYSTEMS: Full 14 system review of systems performed and notable only for those listed, all others are neg:  Constitutional: neg  Cardiovascular: neg Ear/Nose/Throat: neg  Skin: neg Eyes: neg Respiratory: neg Gastroitestinal: neg  Hematology/Lymphatic: neg  Endocrine: neg Musculoskeletal:neg Allergy/Immunology: neg Neurological: neg Psychiatric: neg Sleep : neg   ALLERGIES: Allergies  Allergen Reactions  . Gabapentin Other (See Comments)    Chest pain    HOME MEDICATIONS: Outpatient Prescriptions Prior to Visit  Medication Sig Dispense Refill  . amLODipine (NORVASC) 5 MG tablet Take 1 tablet (5 mg total) by mouth daily. 90 tablet 1  . atorvastatin (LIPITOR) 40 MG tablet Take 1 tablet (40 mg total) by mouth daily. 90 tablet 1  . cholecalciferol (VITAMIN D) 1000 UNITS tablet Take 2,000 Units by mouth daily.    . Cholecalciferol 2000 units CAPS Take 1 capsule (2,000 Units total) by mouth daily. 30 each 5  . glucose blood (ONETOUCH VERIO) test strip Test 1x per day and prn  Dx e11.9 100 each 12  . losartan (COZAAR) 100 MG tablet  Take 1 tablet (100 mg total) by mouth daily. 90 tablet 1  . metFORMIN (GLUCOPHAGE XR) 500 MG 24 hr tablet Take 1 tablet (500 mg total) by mouth every evening. With your evening meal 90 tablet 1  . ONETOUCH DELICA LANCETS 99991111 MISC 1 each by Does not apply route daily. Test 1x per day and prn  Dx E11.9 100 each 3  . phenytoin (DILANTIN) 100 MG ER capsule Take 3 capsules (300 mg total) by mouth at bedtime. 270 capsule 3   No facility-administered medications prior to visit.    PAST MEDICAL HISTORY: Past Medical History  Diagnosis Date  . Hypertension   . Seizures ([place])   . Diabetes mellitus without complication ([place])     PAST SURGICAL HISTORY: Past Surgical History  Procedure Laterality Date  . Abdominal hysterectomy    . Cholecystectomy    . Appendectomy      FAMILY HISTORY: Family History  Problem Relation Age of Onset  . Diabetes Mother   . Heart attack Mother 81  . Healthy Father   . Alcohol abuse Father   . Diabetes Sister   . Cancer Sister     breast  . Cirrhosis Brother   . Leukemia Sister   . Diabetes Sister   . Kidney disease Sister     on dialysis    SOCIAL HISTORY: Social History   Social History  . Marital Status: Single    Spouse Name: N/A  . Number of Children: 0  . Years of Education: 12   Occupational History  .     Social History Main Topics  .  Smoking status: Current Every Day Smoker -- 1.00 packs/day    Types: Cigarettes  . Smokeless tobacco: Never Used  . Alcohol Use: No  . Drug Use: No  . Sexual Activity: Not on file   Other Topics Concern  . Not on file   Social History Narrative   Patient is single with no children   Patient is right handed   Patient has a high school education   Patient drinks 2-3 cups daily     PHYSICAL EXAM  Filed Vitals:   10/09/15 0805 10/09/15 0812  BP: 159/101 150/82  Pulse: 75 72  Height: 5\' 4"  (1.626 m) 5\' 4"  (1.626 m)  Weight: 200 lb 9.6 oz (90.992 kg) 200 lb 9.6 oz (90.992 kg)   Body mass  index is 34.42 kg/(m^2). General: well developed, well nourished, seated, in no evident distress  Head: head normocephalic and atraumatic. Oropharynx benign  Neck: supple with no carotid bruits  Cardiovascular: regular rate and rhythm, no murmurs  Neurologic Exam  Mental Status: Awake and fully alert. Oriented to place and time. Follows all commands. Speech and language normal.  Cranial Nerves: Pupils equal, briskly reactive to light. Extraocular movements full without nystagmus. Visual fields full to confrontation. Hearing intact and symmetric to finger snap. Facial sensation intact. Face, tongue, palate move normally and symmetrically. Neck flexion and extension normal.  Motor: Normal bulk and tone. Normal strength in all tested extremity muscles.No focal weakness  Coordination: Rapid alternating movements normal in all extremities. Finger-to-nose and heel-to-shin performed accurately bilaterally. No dysmetria  Gait and Station: Arises from chair without difficulty. Stance is normal. Gait demonstrates normal stride length and balance . Able to heel, toe and tandem walk without difficulty.  Reflexes: 1+ and symmetric. Toes downgoing.    DIAGNOSTIC DATA (LABS, IMAGING, TESTING) - I reviewed patient records, labs, notes, testing and imaging myself where available.      Component Value Date/Time   NA 142 07/24/2015 1209   NA 137 09/22/2012 1213   K 4.1 07/24/2015 1209   CL 102 07/24/2015 1209   CO2 23 07/24/2015 1209   GLUCOSE 103* 07/24/2015 1209   GLUCOSE 88 09/22/2012 1213   BUN 14 07/24/2015 1209   BUN 15 09/22/2012 1213   CREATININE 0.86 07/24/2015 1209   CREATININE 0.77 09/22/2012 1213   CALCIUM 9.1 07/24/2015 1209   PROT 6.9 07/24/2015 1209   PROT 6.8 09/22/2012 1213   ALBUMIN 4.1 07/24/2015 1209   ALBUMIN 3.9 09/22/2012 1213   AST 10 07/24/2015 1209   ALT 13 07/24/2015 1209   ALKPHOS 218* 07/24/2015 1209   BILITOT 0.3 07/24/2015 1209   BILITOT 0.3 01/23/2014  1017   GFRNONAA 76 07/24/2015 1209   GFRNONAA 89 09/22/2012 1213   GFRAA 88 07/24/2015 1209   GFRAA >89 09/22/2012 1213   Lab Results  Component Value Date   CHOL 156 07/24/2015   HDL 49 07/24/2015   LDLCALC 71 07/24/2015   TRIG 178* 07/24/2015   CHOLHDL 3.2 07/24/2015   Lab Results  Component Value Date   HGBA1C 7.3 04/24/2015    ASSESSMENT AND PLAN 55y.o. year old female  has a past medical history of Hypertension and Seizures. here to follow-up. No seizure activity in 21 years. Currently on Dilantin 300 daily. Blood pressure noted to be elevated today however patient has not taken  her medication before the appointment.The patient is a current patient of Dr. Jannifer Franklin who is out of the office today . This note is sent to  the work in Tax [employer].     Continue Dilantin at current dose will refill 3 month with 3 refills Call for any seizure activity Follow-up yearly and when necessary Melanie Maynard, [facility], [facility], [facility] [address] [place], [place] [address] [phone]

## 2015-10-09 NOTE — Patient Instructions (Addendum)
Thank you for coming to [facility]. I hope we have been able to provide you with high quality care today You may receive a patient's satisfaction survey over the next few weeks we would appreciate your feedback and comments so that we can continue to improve ourselves and the  health of our patients Continue Dilantin at current dose will refill 3 month with 3 refills Call for any seizure activity Follow-up yearly and when necessary

## 2015-10-09 NOTE — Progress Notes (Signed)
I have reviewed and agreed above plan. 

## 2015-10-29 ENCOUNTER — Ambulatory Visit (INDEPENDENT_AMBULATORY_CARE_PROVIDER_SITE_OTHER): Payer: BLUE CROSS/BLUE SHIELD | Admitting: Nurse Practitioner

## 2015-10-29 ENCOUNTER — Encounter: Payer: Self-pay | Admitting: Nurse Practitioner

## 2015-10-29 VITALS — BP 129/89 | HR 74 | Temp 98.0°F | Ht 64.0 in | Wt 198.6 lb

## 2015-10-29 DIAGNOSIS — Z1212 Encounter for screening for malignant neoplasm of rectum: Secondary | ICD-10-CM | POA: Diagnosis not present

## 2015-10-29 DIAGNOSIS — G5791 Unspecified mononeuropathy of right lower limb: Secondary | ICD-10-CM | POA: Diagnosis not present

## 2015-10-29 DIAGNOSIS — R569 Unspecified convulsions: Secondary | ICD-10-CM | POA: Diagnosis not present

## 2015-10-29 DIAGNOSIS — E559 Vitamin D deficiency, unspecified: Secondary | ICD-10-CM

## 2015-10-29 DIAGNOSIS — E119 Type 2 diabetes mellitus without complications: Secondary | ICD-10-CM | POA: Diagnosis not present

## 2015-10-29 DIAGNOSIS — M792 Neuralgia and neuritis, unspecified: Secondary | ICD-10-CM

## 2015-10-29 DIAGNOSIS — E785 Hyperlipidemia, unspecified: Secondary | ICD-10-CM | POA: Diagnosis not present

## 2015-10-29 DIAGNOSIS — I10 Essential (primary) hypertension: Secondary | ICD-10-CM

## 2015-10-29 LAB — BAYER DCA HB A1C WAIVED: HB A1C: 7.2 % — AB (ref <7.0)

## 2015-10-29 NOTE — Progress Notes (Signed)
Subjective:    Patient ID: Melanie Maynard, female    DOB: 1960-07-05, 55 y.o.   MRN: [id number]  Patient here today for follow up of chronic medical problems.  Outpatient Encounter Prescriptions as of 10/29/2015  Medication Sig  . amLODipine (NORVASC) 5 MG tablet Take 1 tablet (5 mg total) by mouth daily.  Marland Kitchen atorvastatin (LIPITOR) 40 MG tablet Take 1 tablet (40 mg total) by mouth daily.  . Cholecalciferol 2000 units CAPS Take 1 capsule (2,000 Units total) by mouth daily.  Marland Kitchen glucose blood (ONETOUCH VERIO) test strip Test 1x per day and prn  Dx e11.9  . losartan (COZAAR) 100 MG tablet Take 1 tablet (100 mg total) by mouth daily.  . metFORMIN (GLUCOPHAGE XR) 500 MG 24 hr tablet Take 1 tablet (500 mg total) by mouth every evening. With your evening meal  . ONETOUCH DELICA LANCETS 32K MISC 1 each by Does not apply route daily. Test 1x per day and prn  Dx E11.9  . phenytoin (DILANTIN) 100 MG ER capsule Take 3 capsules (300 mg total) by mouth at bedtime.   Hypertension  This is a chronic problem. The current episode started more than 1 year ago. The problem is unchanged. The problem is uncontrolled. Pertinent negatives include no blurred vision, chest pain, headaches, palpitations, peripheral edema, shortness of breath or sweats. There are no associated agents to hypertension. Risk factors for coronary artery disease include dyslipidemia and obesity. Past treatments include calcium channel blockers and ACE inhibitors. The current treatment provides mild improvement. Compliance problems include diet and exercise.  There is no history of CAD/MI or CVA. There is no history of sleep apnea.  Hyperlipidemia  This is a chronic problem. The current episode started more than 1 year ago. The problem is uncontrolled. Recent lipid tests were reviewed and are high. Exacerbating diseases include obesity. She has no history of diabetes or hypothyroidism. Pertinent negatives include no chest pain, myalgias or shortness  of breath. Current antihyperlipidemic treatment includes statins. The current treatment provides mild improvement of lipids. Compliance problems include adherence to diet and adherence to exercise.  Risk factors for coronary artery disease include dyslipidemia, hypertension, obesity and post-menopausal.  Diabetes  She has type 2 diabetes mellitus. Her disease course has been stable. Pertinent negatives for hypoglycemia include no headaches or sweats. Pertinent negatives for diabetes include no blurred vision, no chest pain, no fatigue, no polydipsia, no polyphagia and no polyuria. Pertinent negatives for diabetic complications include no CVA. Risk factors for coronary artery disease include dyslipidemia, hypertension and diabetes mellitus. Her weight is stable. She is following a diabetic diet. When asked about meal planning, she reported none. She has not had a previous visit with a dietitian. She rarely participates in exercise. There is no change in her home blood glucose trend. Her breakfast blood glucose is taken between 8-9 am. Her breakfast blood glucose range is generally 110-130 mg/dl. Her highest blood glucose is 110-130 mg/dl. Her overall blood glucose range is 110-130 mg/dl. She sees a podiatrist.Eye exam is not current.  seizures Has had for over 20 years- currently on dilantin- last seizure was over 14 years ago- sees neurologist 1X per year Left foot neuropathy  Has been increasing in pain- worse when driving- is currently not taking anything- would like to try something.    Review of Systems  Constitutional: Negative.  Negative for appetite change, chills, fatigue and fever.  HENT: Negative for congestion and postnasal drip.   Eyes: Negative for blurred  vision.  Respiratory: Positive for cough (nonproductive). Negative for chest tightness and shortness of breath.   Cardiovascular: Negative for chest pain, palpitations and leg swelling.  Gastrointestinal: Negative.   Endocrine:  Negative for polydipsia, polyphagia and polyuria.  Genitourinary: Negative.   Musculoskeletal: Negative for myalgias.  Neurological: Negative.  Negative for headaches.  Psychiatric/Behavioral: Negative.   All other systems reviewed and are negative.      Objective:   Physical Exam  Constitutional: She is oriented to person, place, and time. She appears well-developed and well-nourished.  HENT:  Nose: Nose normal.  Mouth/Throat: Oropharynx is clear and moist.  Eyes: EOM are normal.  Neck: Trachea normal, normal range of motion and full passive range of motion without pain. Neck supple. No JVD present. Carotid bruit is not present. No thyromegaly present.  Cardiovascular: Normal rate, regular rhythm, normal heart sounds and intact distal pulses.  Exam reveals no gallop and no friction rub.   No murmur heard. Pulmonary/Chest: Effort normal and breath sounds normal.  Abdominal: Soft. Bowel sounds are normal. She exhibits no distension and no mass. There is no tenderness.  Musculoskeletal: Normal range of motion.  Lymphadenopathy:    She has no cervical adenopathy.  Neurological: She is alert and oriented to person, place, and time. She has normal reflexes.  Skin: Skin is warm and dry.  Cholesterol deposit left eye lid  Psychiatric: She has a normal mood and affect. Her behavior is normal. Judgment and thought content normal.   BP 129/89 (BP Location: Left Arm, Patient Position: Sitting, Cuff Size: Normal)   Pulse 74   Temp 98 F (36.7 C) (Oral)   Ht '5\' 4"'  (1.626 m)   Wt 198 lb 9.6 oz (90.1 kg)   LMP 01/23/2001 (Approximate)   BMI 34.09 kg/m    hgba1c 7.2%    Assessment & Plan:  1. Hyperlipidemia with target LDL less than 100 Low fat diet - Lipid panel  2. Essential hypertension Do not add salt to diet - CMP14+EGFR  3. Type 2 diabetes mellitus treated without insulin (HCC) Continue to watch carbs in diet - Bayer DCA Hb A1c Waived  4. Seizures [facility]) See neurologist  if seizures reoccur  5. Morbid obesity, unspecified obesity type ([place]) Discussed diet and exercise for person with BMI >25 Will recheck weight in 3-6 months   6. Vitamin D deficiency  7. Neuralgia of right lower extremity  8. Screening for malignant neoplasm of the rectum - Fecal occult blood, imunochemical; Future    Labs pending Health maintenance reviewed Diet and exercise encouraged Continue all meds Follow up  In 3 months   [place], FNP

## 2015-10-29 NOTE — Patient Instructions (Signed)
Bone Health Bones protect organs, store calcium, and anchor muscles. Good health habits, such as eating nutritious foods and exercising regularly, are important for maintaining healthy bones. They can also help to prevent a condition that causes bones to lose density and become weak and brittle (osteoporosis). WHY IS BONE MASS IMPORTANT? Bone mass refers to the amount of bone tissue that you have. The higher your bone mass, the stronger your bones. An important step toward having healthy bones throughout life is to have strong and dense bones during childhood. A young adult who has a high bone mass is more likely to have a high bone mass later in life. Bone mass at its greatest it is called peak bone mass. A large decline in bone mass occurs in older adults. In women, it occurs about the time of menopause. During this time, it is important to practice good health habits, because if more bone is lost than what is replaced, the bones will become less healthy and more likely to break (fracture). If you find that you have a low bone mass, you may be able to prevent osteoporosis or further bone loss by changing your diet and lifestyle. HOW CAN I FIND OUT IF MY BONE MASS IS LOW? Bone mass can be measured with an X-ray test that is called a bone mineral density (BMD) test. This test is recommended for all women who are age 65 or older. It may also be recommended for men who are age 70 or older, or for people who are more likely to develop osteoporosis due to:  Having bones that break easily.  Having a long-term disease that weakens bones, such as kidney disease or rheumatoid arthritis.  Having menopause earlier than normal.  Taking medicine that weakens bones, such as steroids, thyroid hormones, or hormone treatment for breast cancer or prostate cancer.  Smoking.  Drinking three or more alcoholic drinks each day. WHAT ARE THE NUTRITIONAL RECOMMENDATIONS FOR HEALTHY BONES? To have healthy bones, you need  to get enough of the right minerals and vitamins. Most nutrition experts recommend getting these nutrients from the foods that you eat. Nutritional recommendations vary from person to person. Ask your health care provider what is healthy for you. Here are some general guidelines. Calcium Recommendations Calcium is the most important (essential) mineral for bone health. Most people can get enough calcium from their diet, but supplements may be recommended for people who are at risk for osteoporosis. Good sources of calcium include:  Dairy products, such as low-fat or nonfat milk, cheese, and yogurt.  Dark green leafy vegetables, such as bok choy and broccoli.  Calcium-fortified foods, such as orange juice, cereal, bread, soy beverages, and tofu products.  Nuts, such as almonds. Follow these recommended amounts for daily calcium intake:  Children, age 1-3: 700 mg.  Children, age 4-8: 1,000 mg.  Children, age 9-13: 1,300 mg.  Teens, age 14-18: 1,300 mg.  Adults, age 19-50: 1,000 mg.  Adults, age 51-70:  Men: 1,000 mg.  Women: 1,200 mg.  Adults, age 71 or older: 1,200 mg.  Pregnant and breastfeeding females:  Teens: 1,300 mg.  Adults: 1,000 mg. Vitamin D Recommendations Vitamin D is the most essential vitamin for bone health. It helps the body to absorb calcium. Sunlight stimulates the skin to make vitamin D, so be sure to get enough sunlight. If you live in a cold climate or you do not get outside often, your health care provider may recommend that you take vitamin D supplements. Good   sources of vitamin D in your diet include:  Egg yolks.  Saltwater fish.  Milk and cereal fortified with vitamin D. Follow these recommended amounts for daily vitamin D intake:  Children and teens, age 1-18: 600 international units.  Adults, age 50 or younger: 400-800 international units.  Adults, age 51 or older: 800-1,000 international units. Other Nutrients Other nutrients for bone  health include:  Phosphorus. This mineral is found in meat, poultry, dairy foods, nuts, and legumes. The recommended daily intake for adult men and adult women is 700 mg.  Magnesium. This mineral is found in seeds, nuts, dark green vegetables, and legumes. The recommended daily intake for adult men is 400-420 mg. For adult women, it is 310-320 mg.  Vitamin K. This vitamin is found in green leafy vegetables. The recommended daily intake is 120 mg for adult men and 90 mg for adult women. WHAT TYPE OF PHYSICAL ACTIVITY IS BEST FOR BUILDING AND MAINTAINING HEALTHY BONES? Weight-bearing and strength-building activities are important for building and maintaining peak bone mass. Weight-bearing activities cause muscles and bones to work against gravity. Strength-building activities increases muscle strength that supports bones. Weight-bearing and muscle-building activities include:  Walking and hiking.  Jogging and running.  Dancing.  Gym exercises.  Lifting weights.  Tennis and racquetball.  Climbing stairs.  Aerobics. Adults should get at least 30 minutes of moderate physical activity on most days. Children should get at least 60 minutes of moderate physical activity on most days. Ask your health care provide what type of exercise is best for you. WHERE CAN I FIND MORE INFORMATION? For more information, check out the following websites:  National Osteoporosis Foundation: http://nof.org/learn/basics  National Institutes of Health: http://www.niams.nih.gov/Health_Info/Bone/Bone_Health/bone_health_for_life.asp   This information is not intended to replace advice given to you by your health care provider. Make sure you discuss any questions you have with your health care provider.   Document Released: 05/31/2003 Document Revised: 07/25/2014 Document Reviewed: 03/15/2014 Elsevier Interactive Patient Education 2016 Elsevier Inc.  

## 2015-10-30 LAB — CMP14+EGFR
A/G RATIO: 1.4 (ref 1.2–2.2)
ALT: 10 IU/L (ref 0–32)
AST: 14 IU/L (ref 0–40)
Albumin: 4 g/dL (ref 3.5–5.5)
Alkaline Phosphatase: 213 IU/L — ABNORMAL HIGH (ref 39–117)
BUN/Creatinine Ratio: 17 (ref 9–23)
BUN: 16 mg/dL (ref 6–24)
Bilirubin Total: 0.4 mg/dL (ref 0.0–1.2)
CALCIUM: 9.1 mg/dL (ref 8.7–10.2)
CO2: 26 mmol/L (ref 18–29)
CREATININE: 0.96 mg/dL (ref 0.57–1.00)
Chloride: 101 mmol/L (ref 96–106)
GFR, EST AFRICAN AMERICAN: 77 mL/min/{1.73_m2} (ref >59)
GFR, EST NON AFRICAN AMERICAN: 67 mL/min/{1.73_m2} (ref >59)
GLOBULIN, TOTAL: 2.8 g/dL (ref 1.5–4.5)
Glucose: 119 mg/dL — ABNORMAL HIGH (ref 65–99)
POTASSIUM: 4.5 mmol/L (ref 3.5–5.2)
SODIUM: 142 mmol/L (ref 134–144)
TOTAL PROTEIN: 6.8 g/dL (ref 6.0–8.5)

## 2015-10-30 LAB — LIPID PANEL
CHOLESTEROL TOTAL: 137 mg/dL (ref 100–199)
Chol/HDL Ratio: 2.9 ratio units (ref 0.0–4.4)
HDL: 48 mg/dL (ref >39)
LDL Calculated: 65 mg/dL (ref 0–99)
TRIGLYCERIDES: 122 mg/dL (ref 0–149)
VLDL Cholesterol Cal: 24 mg/dL (ref 5–40)

## 2016-01-30 ENCOUNTER — Ambulatory Visit: Payer: BLUE CROSS/BLUE SHIELD | Admitting: Nurse Practitioner

## 2016-02-25 ENCOUNTER — Ambulatory Visit (INDEPENDENT_AMBULATORY_CARE_PROVIDER_SITE_OTHER): Payer: BLUE CROSS/BLUE SHIELD | Admitting: Nurse Practitioner

## 2016-02-25 ENCOUNTER — Encounter: Payer: Self-pay | Admitting: Nurse Practitioner

## 2016-02-25 VITALS — BP 154/100 | HR 79 | Temp 97.1°F | Ht 64.0 in | Wt 196.0 lb

## 2016-02-25 DIAGNOSIS — E785 Hyperlipidemia, unspecified: Secondary | ICD-10-CM

## 2016-02-25 DIAGNOSIS — Z1211 Encounter for screening for malignant neoplasm of colon: Secondary | ICD-10-CM

## 2016-02-25 DIAGNOSIS — R569 Unspecified convulsions: Secondary | ICD-10-CM | POA: Diagnosis not present

## 2016-02-25 DIAGNOSIS — E559 Vitamin D deficiency, unspecified: Secondary | ICD-10-CM | POA: Diagnosis not present

## 2016-02-25 DIAGNOSIS — Z1212 Encounter for screening for malignant neoplasm of rectum: Secondary | ICD-10-CM | POA: Diagnosis not present

## 2016-02-25 DIAGNOSIS — E119 Type 2 diabetes mellitus without complications: Secondary | ICD-10-CM

## 2016-02-25 DIAGNOSIS — I10 Essential (primary) hypertension: Secondary | ICD-10-CM | POA: Diagnosis not present

## 2016-02-25 LAB — BAYER DCA HB A1C WAIVED: HB A1C: 7.1 % — AB (ref <7.0)

## 2016-02-25 MED ORDER — AMLODIPINE BESYLATE 5 MG PO TABS: 5.0000 mg | ORAL_TABLET | Freq: Every day | ORAL | 1 refills | Status: DC

## 2016-02-25 MED ORDER — ATORVASTATIN CALCIUM 40 MG PO TABS: 40.0000 mg | ORAL_TABLET | Freq: Every day | ORAL | 1 refills | Status: DC

## 2016-02-25 MED ORDER — METFORMIN HCL ER 500 MG PO TB24: 500.0000 mg | ORAL_TABLET | Freq: Every evening | ORAL | 1 refills | Status: DC

## 2016-02-25 MED ORDER — LOSARTAN POTASSIUM 100 MG PO TABS: 100.0000 mg | ORAL_TABLET | Freq: Every day | ORAL | 1 refills | Status: DC

## 2016-02-25 NOTE — Progress Notes (Signed)
Subjective:    Patient ID: Melanie Maynard, female    DOB: [birth year], 55 y.o.   MRN: [id number]  Patient here today for follow up of chronic medical problems.  Outpatient Encounter Prescriptions as of 10/29/2015  Medication Sig  . amLODipine (NORVASC) 5 MG tablet Take 1 tablet (5 mg total) by mouth daily.  Marland Kitchen atorvastatin (LIPITOR) 40 MG tablet Take 1 tablet (40 mg total) by mouth daily.  . Cholecalciferol 2000 units CAPS Take 1 capsule (2,000 Units total) by mouth daily.  Marland Kitchen glucose blood (ONETOUCH VERIO) test strip Test 1x per day and prn  Dx e11.9  . losartan (COZAAR) 100 MG tablet Take 1 tablet (100 mg total) by mouth daily.  . metFORMIN (GLUCOPHAGE XR) 500 MG 24 hr tablet Take 1 tablet (500 mg total) by mouth every evening. With your evening meal  . ONETOUCH DELICA LANCETS 41P MISC 1 each by Does not apply route daily. Test 1x per day and prn  Dx E11.9  . phenytoin (DILANTIN) 100 MG ER capsule Take 3 capsules (300 mg total) by mouth at bedtime.  Patient has ran out of her blood pressure meds. Hypertension  This is a chronic problem. The current episode started more than 1 year ago. The problem is unchanged. The problem is uncontrolled. Pertinent negatives include no blurred vision, chest pain, headaches, palpitations, peripheral edema, shortness of breath or sweats. There are no associated agents to hypertension. Risk factors for coronary artery disease include dyslipidemia and obesity. Past treatments include calcium channel blockers and ACE inhibitors. The current treatment provides mild improvement. Compliance problems include diet and exercise.  There is no history of CAD/MI or CVA. There is no history of sleep apnea.  Hyperlipidemia  This is a chronic problem. The current episode started more than 1 year ago. The problem is uncontrolled. Recent lipid tests were reviewed and are high. Exacerbating diseases include obesity. She has no history of diabetes or hypothyroidism. Pertinent negatives  include no chest pain, myalgias or shortness of breath. Current antihyperlipidemic treatment includes statins. The current treatment provides mild improvement of lipids. Compliance problems include adherence to diet and adherence to exercise.  Risk factors for coronary artery disease include dyslipidemia, hypertension, obesity and post-menopausal.  Diabetes  She has type 2 diabetes mellitus. Her disease course has been stable. Pertinent negatives for hypoglycemia include no headaches or sweats. Pertinent negatives for diabetes include no blurred vision, no chest pain, no fatigue, no polydipsia, no polyphagia and no polyuria. Pertinent negatives for diabetic complications include no CVA. Risk factors for coronary artery disease include dyslipidemia, hypertension and diabetes mellitus. Current diabetic treatment includes oral agent (dual therapy) (patient has not taken metfomin in over a month.). Her weight is stable. She is following a diabetic diet. When asked about meal planning, she reported none. She has not had a previous visit with a dietitian. She rarely participates in exercise. There is no change in her home blood glucose trend. Her breakfast blood glucose is taken between 8-9 am. Her breakfast blood glucose range is generally 110-130 mg/dl. Her highest blood glucose is 110-130 mg/dl. Her overall blood glucose range is 110-130 mg/dl. She sees a podiatrist.Eye exam is not current.  seizures Has had for over 20 years- currently on dilantin- last seizure was over 14 years ago- sees neurologist 1X per year Left foot neuropathy  Has been increasing in pain- worse when driving- is currently not taking anything- would like to try something. Vitamin d On cholecalciferol, does not remember  to take everyday.  * Only complaint is an irritating cough- no fever or congestion.  Review of Systems  Constitutional: Negative.  Negative for appetite change, chills, fatigue and fever.  HENT: Negative for  congestion and postnasal drip.   Eyes: Negative for blurred vision.  Respiratory: Positive for cough (nonproductive). Negative for chest tightness and shortness of breath.   Cardiovascular: Negative for chest pain, palpitations and leg swelling.  Gastrointestinal: Negative.   Endocrine: Negative for polydipsia, polyphagia and polyuria.  Genitourinary: Negative.   Musculoskeletal: Negative for myalgias.  Neurological: Negative.  Negative for headaches.  Psychiatric/Behavioral: Negative.   All other systems reviewed and are negative.      Objective:   Physical Exam  Constitutional: She is oriented to person, place, and time. She appears well-developed and well-nourished.  HENT:  Nose: Nose normal.  Mouth/Throat: Oropharynx is clear and moist.  Eyes: EOM are normal.  Neck: Trachea normal, normal range of motion and full passive range of motion without pain. Neck supple. No JVD present. Carotid bruit is not present. No thyromegaly present.  Cardiovascular: Normal rate, regular rhythm, normal heart sounds and intact distal pulses.  Exam reveals no gallop and no friction rub.   No murmur heard. Pulmonary/Chest: Effort normal and breath sounds normal.  Abdominal: Soft. Bowel sounds are normal. She exhibits no distension and no mass. There is no tenderness.  Musculoskeletal: Normal range of motion.  Lymphadenopathy:    She has no cervical adenopathy.  Neurological: She is alert and oriented to person, place, and time. She has normal reflexes.  Skin: Skin is warm and dry.  Cholesterol deposit left eye lid  Psychiatric: She has a normal mood and affect. Her behavior is normal. Judgment and thought content normal.   BP (!) 154/100 (BP Location: Left Arm, Cuff Size: Large)   Pulse 79   Temp 97.1 F (36.2 C) (Oral)   Ht '5\' 4"'  (1.626 m)   Wt 196 lb (88.9 kg)   LMP 01/23/2001 (Approximate)   BMI 33.64 kg/m      hgba1c 7.1%    Assessment & Plan:  1. Hyperlipidemia with target LDL  less than 100 Low fat diet - Lipid panel - atorvastatin (LIPITOR) 40 MG tablet; Take 1 tablet (40 mg total) by mouth daily.  Dispense: 90 tablet; Refill: 1  2. Essential hypertension Low sodium diet - CMP14+EGFR - amLODipine (NORVASC) 5 MG tablet; Take 1 tablet (5 mg total) by mouth daily.  Dispense: 90 tablet; Refill: 1 - losartan (COZAAR) 100 MG tablet; Take 1 tablet (100 mg total) by mouth daily.  Dispense: 90 tablet; Refill: 1  3. Type 2 diabetes mellitus treated without insulin (HCC) Stricter carb counting - Bayer DCA Hb A1c Waived - Microalbumin / creatinine urine ratio - metFORMIN (GLUCOPHAGE XR) 500 MG 24 hr tablet; Take 1 tablet (500 mg total) by mouth every evening. With your evening meal  Dispense: 90 tablet; Refill: 1  4. Morbid obesity ([place]) Mucous membranes are moist.  5. Seizures ([place]) Continue appointments with neurologist  6. Vitamin D deficiency  7. Encounter for colorectal cancer screening - Fecal occult blood, imunochemical; Future   Encouraged to have eye exam Labs pending Health maintenance reviewed Diet and exercise encouraged Continue all meds Follow up  In 3 months   [place], FNP

## 2016-02-25 NOTE — Patient Instructions (Signed)
Carbohydrate Counting for Diabetes Mellitus, Adult Carbohydrate counting is a method for keeping track of how many carbohydrates you eat. Eating carbohydrates naturally increases the amount of sugar (glucose) in the blood. Counting how many carbohydrates you eat helps keep your blood glucose within normal limits, which helps you manage your diabetes (diabetes mellitus). It is important to know how many carbohydrates you can safely have in each meal. This is different for every person. A diet and nutrition specialist (registered dietitian) can help you make a meal plan and calculate how many carbohydrates you should have at each meal and snack. Carbohydrates are found in the following foods:  Grains, such as breads and cereals.  Dried beans and soy products.  Starchy vegetables, such as potatoes, peas, and corn.  Fruit and fruit juices.  Milk and yogurt.  Sweets and snack foods, such as cake, cookies, candy, chips, and soft drinks. How do I count carbohydrates? There are two ways to count carbohydrates in food. You can use either of the methods or a combination of both. Reading "Nutrition Facts" on packaged food  The "Nutrition Facts" list is included on the labels of almost all packaged foods and beverages in the [place] It includes:  The serving size.  Information about nutrients in each serving, including the grams (g) of carbohydrate per serving. To use the "Nutrition Facts":  Decide how many servings you will have.  Multiply the number of servings by the number of carbohydrates per serving.  The resulting number is the total amount of carbohydrates that you will be having. Learning standard serving sizes of other foods  When you eat foods containing carbohydrates that are not packaged or do not include "Nutrition Facts" on the label, you need to measure the servings in order to count the amount of carbohydrates:  Measure the foods that you will eat with a food scale or measuring  cup, if needed.  Decide how many standard-size servings you will eat.  Multiply the number of servings by 15. Most carbohydrate-rich foods have about 15 g of carbohydrates per serving.  For example, if you eat 8 oz (170 g) of strawberries, you will have eaten 2 servings and 30 g of carbohydrates (2 servings x 15 g = 30 g).  For foods that have more than one food mixed, such as soups and casseroles, you must count the carbohydrates in each food that is included. The following list contains standard serving sizes of common carbohydrate-rich foods. Each of these servings has about 15 g of carbohydrates:   hamburger bun or  English muffin.   oz (15 mL) syrup.   oz (14 g) jelly.  1 slice of bread.  1 six-inch tortilla.  3 oz (85 g) cooked rice or pasta.  4 oz (113 g) cooked dried beans.  4 oz (113 g) starchy vegetable, such as peas, corn, or potatoes.  4 oz (113 g) hot cereal.  4 oz (113 g) mashed potatoes or  of a large baked potato.  4 oz (113 g) canned or frozen fruit.  4 oz (120 mL) fruit juice.  4-6 crackers.  6 chicken nuggets.  6 oz (170 g) unsweetened dry cereal.  6 oz (170 g) plain fat-free yogurt or yogurt sweetened with artificial sweeteners.  8 oz (240 mL) milk.  8 oz (170 g) fresh fruit or one small piece of fruit.  24 oz (680 g) popped popcorn. Example of carbohydrate counting Sample meal  3 oz (85 g) chicken breast.  6 oz (  170 g) brown rice.  4 oz (113 g) corn.  8 oz (240 mL) milk.  8 oz (170 g) strawberries with sugar-free whipped topping. Carbohydrate calculation 1. Identify the foods that contain carbohydrates:  Rice.  Corn.  Milk.  Strawberries. 2. Calculate how many servings you have of each food:  2 servings rice.  1 serving corn.  1 serving milk.  1 serving strawberries. 3. Multiply each number of servings by 15 g:  2 servings rice x 15 g = 30 g.  1 serving corn x 15 g = 15 g.  1 serving milk x 15 g = 15  g.  1 serving strawberries x 15 g = 15 g. 4. Add together all of the amounts to find the total grams of carbohydrates eaten:  30 g + 15 g + 15 g + 15 g = 75 g of carbohydrates total. This information is not intended to replace advice given to you by your health care provider. Make sure you discuss any questions you have with your health care provider. Document Released: 03/10/2005 Document Revised: 09/28/2015 Document Reviewed: 08/22/2015 Elsevier Interactive Patient Education  2017 [employer] Inc.  

## 2016-02-26 LAB — CMP14+EGFR
ALK PHOS: 217 IU/L — AB (ref 39–117)
ALT: 11 IU/L (ref 0–32)
AST: 12 IU/L (ref 0–40)
Albumin/Globulin Ratio: 1.6 (ref 1.2–2.2)
Albumin: 4.1 g/dL (ref 3.5–5.5)
BUN/Creatinine Ratio: 21 (ref 9–23)
BUN: 16 mg/dL (ref 6–24)
Bilirubin Total: <0.2 mg/dL (ref 0.0–1.2)
CALCIUM: 9 mg/dL (ref 8.7–10.2)
CO2: 28 mmol/L (ref 18–29)
CREATININE: 0.75 mg/dL (ref 0.57–1.00)
Chloride: 101 mmol/L (ref 96–106)
GFR calc Af Amer: 104 mL/min/{1.73_m2} (ref >59)
GFR, EST NON AFRICAN AMERICAN: 90 mL/min/{1.73_m2} (ref >59)
GLUCOSE: 90 mg/dL (ref 65–99)
Globulin, Total: 2.6 g/dL (ref 1.5–4.5)
Potassium: 3.9 mmol/L (ref 3.5–5.2)
SODIUM: 142 mmol/L (ref 134–144)
Total Protein: 6.7 g/dL (ref 6.0–8.5)

## 2016-02-26 LAB — LIPID PANEL
CHOLESTEROL TOTAL: 179 mg/dL (ref 100–199)
Chol/HDL Ratio: 3.3 ratio units (ref 0.0–4.4)
HDL: 55 mg/dL (ref >39)
LDL CALC: 97 mg/dL (ref 0–99)
TRIGLYCERIDES: 134 mg/dL (ref 0–149)
VLDL CHOLESTEROL CAL: 27 mg/dL (ref 5–40)

## 2016-04-25 DIAGNOSIS — T7840XA Allergy, unspecified, initial encounter: Secondary | ICD-10-CM | POA: Diagnosis not present

## 2016-04-30 ENCOUNTER — Ambulatory Visit (INDEPENDENT_AMBULATORY_CARE_PROVIDER_SITE_OTHER): Payer: BLUE CROSS/BLUE SHIELD | Admitting: Family Medicine

## 2016-04-30 ENCOUNTER — Encounter: Payer: Self-pay | Admitting: Family Medicine

## 2016-04-30 VITALS — BP 154/93 | HR 76 | Temp 97.8°F | Ht 64.0 in | Wt 193.0 lb

## 2016-04-30 DIAGNOSIS — L309 Dermatitis, unspecified: Secondary | ICD-10-CM

## 2016-04-30 MED ORDER — PERMETHRIN 5 % EX CREA: 1.0000 "application " | TOPICAL_CREAM | Freq: Once | CUTANEOUS | 0 refills | Status: AC

## 2016-04-30 NOTE — Progress Notes (Signed)
   Subjective:    Patient ID: Melanie Maynard, female    DOB: 10-17-1960, 56 y.o.   MRN: [id number]  HPI 56 year old female with skin rash. She tells [place] that this feels like bedbugs which she had previously. She is recently stayed in 2 other places besides her home. She denies any other household contacts or family members having any rash or itching. She went to urgent care 3 days ago and they gave her some triamcinolone cream and hydroxyzine but she feels like she is no better.  Patient Active Problem List   Diagnosis Date Noted  . Vitamin D deficiency 07/24/2015  . Type 2 diabetes mellitus treated without insulin ([place]) 02/07/2015  . Morbid obesity ([place]) 01/23/2014  . Essential hypertension 01/23/2014  . Hyperlipidemia with target LDL less than 100 10/17/2013  . Neuralgia of right lower extremity 04/13/2013  . Arthritis of knee, right 04/13/2013  . Seizures ([place]) 09/22/2012   Outpatient Encounter Prescriptions as of 04/30/2016  Medication Sig  . amLODipine (NORVASC) 5 MG tablet Take 1 tablet (5 mg total) by mouth daily.  Marland Kitchen atorvastatin (LIPITOR) 40 MG tablet Take 1 tablet (40 mg total) by mouth daily.  . Cholecalciferol 2000 units CAPS Take 1 capsule (2,000 Units total) by mouth daily.  Marland Kitchen glucose blood (ONETOUCH VERIO) test strip Test 1x per day and prn  Dx e11.9  . hydrOXYzine (ATARAX/VISTARIL) 25 MG tablet   . losartan (COZAAR) 100 MG tablet Take 1 tablet (100 mg total) by mouth daily.  . metFORMIN (GLUCOPHAGE XR) 500 MG 24 hr tablet Take 1 tablet (500 mg total) by mouth every evening. With your evening meal  . ONETOUCH DELICA LANCETS 99991111 MISC 1 each by Does not apply route daily. Test 1x per day and prn  Dx E11.9  . phenytoin (DILANTIN) 100 MG ER capsule Take 3 capsules (300 mg total) by mouth at bedtime.  . triamcinolone cream (KENALOG) 0.1 %    No facility-administered encounter medications on file as of 04/30/2016.       Review of Systems  Constitutional: Negative.   Respiratory:  Negative.   Cardiovascular: Negative.   Skin: Positive for rash.       Objective:   Physical Exam  Constitutional: She appears well-developed and well-nourished.  Skin:  Nonspecific dermatitis on extremities and trunk. There is no definite pattern but it does appear like some sort of bites. There are no bites or itching above her neck.   BP (!) 154/93   Pulse 76   Temp 97.8 F (36.6 C) (Oral)   Ht 5\' 4"  (1.626 m)   Wt 193 lb (87.5 kg)   LMP 01/23/2001 (Approximate)   BMI 33.13 kg/m         Assessment & Plan:  1. Dermatitis I suspect some kind of bites. I think her idea about bedbugs is probably not correct because she does not have any itching or rash on her face or head. On the other hand scabies does not typically affect people above the neck. Will treat with permethrin cream 5% and have advised her to continue hydroxyzine at nighttime for itching she was also advised to take all her clothing and bed linens and wash in the hot wash. If symptoms are not resolved after 1 week repeat the process. If symptoms are not resolved after another week refer to dermatology  Wardell Honour MD

## 2016-05-26 ENCOUNTER — Ambulatory Visit: Payer: BLUE CROSS/BLUE SHIELD | Admitting: Nurse Practitioner

## 2016-06-02 DIAGNOSIS — Z79899 Other long term (current) drug therapy: Secondary | ICD-10-CM | POA: Diagnosis not present

## 2016-06-02 DIAGNOSIS — R079 Chest pain, unspecified: Secondary | ICD-10-CM | POA: Diagnosis not present

## 2016-06-02 DIAGNOSIS — I10 Essential (primary) hypertension: Secondary | ICD-10-CM | POA: Diagnosis not present

## 2016-06-02 DIAGNOSIS — R569 Unspecified convulsions: Secondary | ICD-10-CM | POA: Diagnosis not present

## 2016-06-02 DIAGNOSIS — R0789 Other chest pain: Secondary | ICD-10-CM | POA: Diagnosis not present

## 2016-06-02 DIAGNOSIS — F17200 Nicotine dependence, unspecified, uncomplicated: Secondary | ICD-10-CM | POA: Diagnosis not present

## 2016-06-02 DIAGNOSIS — Z820 Family history of epilepsy and other diseases of the nervous system: Secondary | ICD-10-CM | POA: Diagnosis not present

## 2016-06-02 DIAGNOSIS — E7800 Pure hypercholesterolemia, unspecified: Secondary | ICD-10-CM | POA: Diagnosis not present

## 2016-06-02 DIAGNOSIS — J209 Acute bronchitis, unspecified: Secondary | ICD-10-CM | POA: Diagnosis not present

## 2016-06-02 DIAGNOSIS — J9811 Atelectasis: Secondary | ICD-10-CM | POA: Diagnosis not present

## 2016-06-05 ENCOUNTER — Other Ambulatory Visit: Payer: Self-pay | Admitting: Nurse Practitioner

## 2016-06-05 ENCOUNTER — Ambulatory Visit (INDEPENDENT_AMBULATORY_CARE_PROVIDER_SITE_OTHER): Payer: BLUE CROSS/BLUE SHIELD | Admitting: Nurse Practitioner

## 2016-06-05 ENCOUNTER — Encounter: Payer: Self-pay | Admitting: Nurse Practitioner

## 2016-06-05 VITALS — BP 139/83 | HR 82 | Temp 97.5°F | Ht 64.0 in | Wt 189.0 lb

## 2016-06-05 DIAGNOSIS — E559 Vitamin D deficiency, unspecified: Secondary | ICD-10-CM

## 2016-06-05 DIAGNOSIS — Z1211 Encounter for screening for malignant neoplasm of colon: Secondary | ICD-10-CM

## 2016-06-05 DIAGNOSIS — Z1212 Encounter for screening for malignant neoplasm of rectum: Secondary | ICD-10-CM

## 2016-06-05 DIAGNOSIS — E785 Hyperlipidemia, unspecified: Secondary | ICD-10-CM | POA: Diagnosis not present

## 2016-06-05 DIAGNOSIS — I10 Essential (primary) hypertension: Secondary | ICD-10-CM | POA: Diagnosis not present

## 2016-06-05 DIAGNOSIS — R569 Unspecified convulsions: Secondary | ICD-10-CM | POA: Diagnosis not present

## 2016-06-05 DIAGNOSIS — E119 Type 2 diabetes mellitus without complications: Secondary | ICD-10-CM | POA: Diagnosis not present

## 2016-06-05 LAB — BAYER DCA HB A1C WAIVED: HB A1C (BAYER DCA - WAIVED): 6.9 % (ref <7.0)

## 2016-06-05 MED ORDER — CHOLECALCIFEROL 50 MCG (2000 UT) PO CAPS: 1.0000 | ORAL_CAPSULE | Freq: Every day | ORAL | 5 refills | Status: DC

## 2016-06-05 MED ORDER — ATORVASTATIN CALCIUM 40 MG PO TABS: 40.0000 mg | ORAL_TABLET | Freq: Every day | ORAL | 1 refills | Status: DC

## 2016-06-05 MED ORDER — AMLODIPINE BESYLATE 5 MG PO TABS: 5.0000 mg | ORAL_TABLET | Freq: Every day | ORAL | 1 refills | Status: DC

## 2016-06-05 MED ORDER — METFORMIN HCL ER 500 MG PO TB24: 500.0000 mg | ORAL_TABLET | Freq: Every evening | ORAL | 1 refills | Status: DC

## 2016-06-05 MED ORDER — LOSARTAN POTASSIUM 100 MG PO TABS: 100.0000 mg | ORAL_TABLET | Freq: Every day | ORAL | 1 refills | Status: DC

## 2016-06-05 MED ORDER — PHENYTOIN SODIUM EXTENDED 100 MG PO CAPS: 300.0000 mg | ORAL_CAPSULE | Freq: Every day | ORAL | 3 refills | Status: DC

## 2016-06-05 NOTE — Progress Notes (Signed)
Subjective:    Patient ID: Melanie Maynard, female    DOB: [birth year], 56 y.o.   MRN: [id number]  Patient here today for follow up of chronic medical problems.  Patient reports she recently went to ED for left upper thoracic pain and was diagnosed with bronchitis, given antitussive medication to treat. Patient explains her symptoms are getting better. Patient also explains that due to personal hardship (death of sister) she was off of her medications for 1 week. Patient is back taking those medications at this time.   Outpatient Encounter Prescriptions as of 10/29/2015  Medication Sig  . amLODipine (NORVASC) 5 MG tablet Take 1 tablet (5 mg total) by mouth daily.  Marland Kitchen atorvastatin (LIPITOR) 40 MG tablet Take 1 tablet (40 mg total) by mouth daily.  . Cholecalciferol 2000 units CAPS Take 1 capsule (2,000 Units total) by mouth daily.  Marland Kitchen glucose blood (ONETOUCH VERIO) test strip Test 1x per day and prn  Dx e11.9  . losartan (COZAAR) 100 MG tablet Take 1 tablet (100 mg total) by mouth daily.  . metFORMIN (GLUCOPHAGE XR) 500 MG 24 hr tablet Take 1 tablet (500 mg total) by mouth every evening. With your evening meal  . ONETOUCH DELICA LANCETS 22L MISC 1 each by Does not apply route daily. Test 1x per day and prn  Dx E11.9  . phenytoin (DILANTIN) 100 MG ER capsule Take 3 capsules (300 mg total) by mouth at bedtime.  Patient has ran out of her blood pressure meds. Hypertension  This is a chronic problem. The current episode started more than 1 year ago. The problem is unchanged. The problem is uncontrolled. Pertinent negatives include no blurred vision, chest pain, headaches, palpitations, peripheral edema, shortness of breath or sweats. There are no associated agents to hypertension. Risk factors for coronary artery disease include dyslipidemia and obesity. Past treatments include calcium channel blockers and ACE inhibitors. The current treatment provides mild improvement. Compliance problems include diet and  exercise.  There is no history of CAD/MI or CVA. There is no history of sleep apnea.  Hyperlipidemia  This is a chronic problem. The current episode started more than 1 year ago. The problem is uncontrolled. Recent lipid tests were reviewed and are high. Exacerbating diseases include obesity. She has no history of diabetes or hypothyroidism. Pertinent negatives include no chest pain, myalgias or shortness of breath. Current antihyperlipidemic treatment includes statins. The current treatment provides mild improvement of lipids. Compliance problems include adherence to diet and adherence to exercise.  Risk factors for coronary artery disease include dyslipidemia, hypertension, obesity and post-menopausal.  Diabetes  She has type 2 diabetes mellitus. Her disease course has been stable. Pertinent negatives for hypoglycemia include no headaches or sweats. Pertinent negatives for diabetes include no blurred vision, no chest pain, no fatigue, no polydipsia, no polyphagia and no polyuria. Pertinent negatives for diabetic complications include no CVA. Risk factors for coronary artery disease include dyslipidemia, hypertension and diabetes mellitus. Current diabetic treatment includes oral agent (dual therapy) (patient has not taken metfomin in over a month.). Her weight is stable. She is following a diabetic diet. When asked about meal planning, she reported none. She has not had a previous visit with a dietitian. She rarely participates in exercise. There is no change in her home blood glucose trend. Her breakfast blood glucose is taken between 8-9 am. Her breakfast blood glucose range is generally 110-130 mg/dl. Her highest blood glucose is 110-130 mg/dl. Her overall blood glucose range is 110-130 mg/dl.  She sees a podiatrist.Eye exam is not current.  seizures Has had for over 20 years- currently on dilantin- last seizure was over 14 years ago- sees neurologist 1X per year Left foot neuropathy  Has been increasing  in pain- worse when driving- is currently not taking anything- would like to try something. Vitamin d On cholecalciferol, does not remember to take everyday.  * Only complaint is an irritating cough- no fever or congestion.  Review of Systems  Constitutional: Negative.  Negative for appetite change, chills, fatigue and fever.  HENT: Negative for congestion and postnasal drip.   Eyes: Negative for blurred vision.  Respiratory: Positive for cough (nonproductive). Negative for chest tightness and shortness of breath.   Cardiovascular: Negative for chest pain, palpitations and leg swelling.  Gastrointestinal: Negative.   Endocrine: Negative for polydipsia, polyphagia and polyuria.  Genitourinary: Negative.   Musculoskeletal: Positive for back pain (left thoracic). Negative for myalgias.  Neurological: Negative.  Negative for headaches.  Psychiatric/Behavioral: Negative.   All other systems reviewed and are negative.      Objective:   Physical Exam  Constitutional: She is oriented to person, place, and time. She appears well-developed and well-nourished.  HENT:  Nose: Nose normal.  Mouth/Throat: Oropharynx is clear and moist.  Eyes: EOM are normal.  Neck: Trachea normal, normal range of motion and full passive range of motion without pain. Neck supple. No JVD present. Carotid bruit is not present. No thyromegaly present.  Cardiovascular: Normal rate, regular rhythm, normal heart sounds and intact distal pulses.  Exam reveals no gallop and no friction rub.   No murmur heard. Pulmonary/Chest: Effort normal and breath sounds normal.  Abdominal: Soft. Bowel sounds are normal. She exhibits no distension and no mass. There is no tenderness.  Musculoskeletal: She exhibits tenderness (to left thoracic).  Lymphadenopathy:    She has no cervical adenopathy.  Neurological: She is alert and oriented to person, place, and time. She has normal reflexes.  Skin: Skin is warm and dry.  Cholesterol  deposit left eye lid  Psychiatric: She has a normal mood and affect. Her behavior is normal. Judgment and thought content normal.   BP 139/83   Pulse 82   Temp 97.5 F (36.4 C) (Oral)   Ht '5\' 4"'  (1.626 m)   Wt 189 lb (85.7 kg)   LMP 01/23/2001 (Approximate)   BMI 32.44 kg/m   hgba1c 6.9% down from 7.1%    Assessment & Plan:  1. Hyperlipidemia with target LDL less than 100 Low fat diet - Lipid panel - atorvastatin (LIPITOR) 40 MG tablet; Take 1 tablet (40 mg total) by mouth daily.  Dispense: 90 tablet; Refill: 1  2. Essential hypertension DASH diet - CMP14+EGFR - amLODipine (NORVASC) 5 MG tablet; Take 1 tablet (5 mg total) by mouth daily.  Dispense: 90 tablet; Refill: 1 - losartan (COZAAR) 100 MG tablet; Take 1 tablet (100 mg total) by mouth daily.  Dispense: 90 tablet; Refill: 1  3. Type 2 diabetes mellitus treated without insulin ([facility]) Low carb diet with exercise - Bayer DCA Hb A1c Waived - metFORMIN (GLUCOPHAGE XR) 500 MG 24 hr tablet; Take 1 tablet (500 mg total) by mouth every evening. With your evening meal  Dispense: 90 tablet; Refill: 1  4. Seizures ([place]) Take medication as precribed - phenytoin (DILANTIN) 100 MG ER capsule; Take 3 capsules (300 mg total) by mouth at bedtime.  Dispense: 270 capsule; Refill: 3   Encouraged to have eye exam Labs pending Health maintenance reviewed  Diet and exercise encouraged Continue all meds Follow up  In 3 months   Dionisio David, FNP student [employer], [place]

## 2016-06-05 NOTE — Progress Notes (Unsigned)
   Subjective:    Patient ID: Melanie Maynard, female    DOB: 1961-01-04, 56 y.o.   MRN: [id number]  HPI    Review of Systems     Objective:   Physical Exam        Assessment & Plan:

## 2016-06-05 NOTE — Patient Instructions (Signed)
Cough, Adult Coughing is a reflex that clears your throat and your airways. Coughing helps to heal and protect your lungs. It is normal to cough occasionally, but a cough that happens with other symptoms or lasts a long time may be a sign of a condition that needs treatment. A cough may last only 2-3 weeks (acute), or it may last longer than 8 weeks (chronic). What are the causes? Coughing is commonly caused by:  Breathing in substances that irritate your lungs.  A viral or bacterial respiratory infection.  Allergies.  Asthma.  Postnasal drip.  Smoking.  Acid backing up from the stomach into the esophagus (gastroesophageal reflux).  Certain medicines.  Chronic lung problems, including COPD (or rarely, lung cancer).  Other medical conditions such as heart failure.  Follow these instructions at home: Pay attention to any changes in your symptoms. Take these actions to help with your discomfort:  Take medicines only as told by your health care provider. ? If you were prescribed an antibiotic medicine, take it as told by your health care provider. Do not stop taking the antibiotic even if you start to feel better. ? Talk with your health care provider before you take a cough suppressant medicine.  Drink enough fluid to keep your urine clear or pale yellow.  If the air is dry, use a cold steam vaporizer or humidifier in your bedroom or your home to help loosen secretions.  Avoid anything that causes you to cough at work or at home.  If your cough is worse at night, try sleeping in a semi-upright position.  Avoid cigarette smoke. If you smoke, quit smoking. If you need help quitting, ask your health care provider.  Avoid caffeine.  Avoid alcohol.  Rest as needed.  Contact a health care provider if:  You have new symptoms.  You cough up pus.  Your cough does not get better after 2-3 weeks, or your cough gets worse.  You cannot control your cough with suppressant  medicines and you are losing sleep.  You develop pain that is getting worse or pain that is not controlled with pain medicines.  You have a fever.  You have unexplained weight loss.  You have night sweats. Get help right away if:  You cough up blood.  You have difficulty breathing.  Your heartbeat is very fast. This information is not intended to replace advice given to you by your health care provider. Make sure you discuss any questions you have with your health care provider. Document Released: 09/06/2010 Document Revised: 08/16/2015 Document Reviewed: 05/17/2014 Elsevier Interactive Patient Education  2017 [employer]  

## 2016-06-06 LAB — LIPID PANEL
CHOL/HDL RATIO: 2.8 ratio (ref 0.0–4.4)
CHOLESTEROL TOTAL: 138 mg/dL (ref 100–199)
HDL: 49 mg/dL (ref >39)
LDL CALC: 55 mg/dL (ref 0–99)
TRIGLYCERIDES: 169 mg/dL — AB (ref 0–149)
VLDL CHOLESTEROL CAL: 34 mg/dL (ref 5–40)

## 2016-06-06 LAB — CMP14+EGFR
ALK PHOS: 197 IU/L — AB (ref 39–117)
ALT: 10 IU/L (ref 0–32)
AST: 15 IU/L (ref 0–40)
Albumin/Globulin Ratio: 1.4 (ref 1.2–2.2)
Albumin: 3.9 g/dL (ref 3.5–5.5)
BUN/Creatinine Ratio: 13 (ref 9–23)
BUN: 15 mg/dL (ref 6–24)
Bilirubin Total: 0.3 mg/dL (ref 0.0–1.2)
CALCIUM: 9.1 mg/dL (ref 8.7–10.2)
CO2: 27 mmol/L (ref 18–29)
CREATININE: 1.14 mg/dL — AB (ref 0.57–1.00)
Chloride: 102 mmol/L (ref 96–106)
GFR calc Af Amer: 63 mL/min/{1.73_m2} (ref >59)
GFR calc non Af Amer: 54 mL/min/{1.73_m2} — ABNORMAL LOW (ref >59)
GLOBULIN, TOTAL: 2.8 g/dL (ref 1.5–4.5)
GLUCOSE: 101 mg/dL — AB (ref 65–99)
Potassium: 4.4 mmol/L (ref 3.5–5.2)
SODIUM: 143 mmol/L (ref 134–144)
Total Protein: 6.7 g/dL (ref 6.0–8.5)

## 2016-06-07 LAB — FECAL OCCULT BLOOD, IMMUNOCHEMICAL: FECAL OCCULT BLD: NEGATIVE

## 2016-06-10 IMAGING — CR DG CHEST 2V
2 series · 2 of 2 positions shown · non-contrast
Comparison: None.

CLINICAL DATA: Cough.  Smoker .

EXAM:
CHEST  2 VIEW

[view not recorded (1 of 2)]
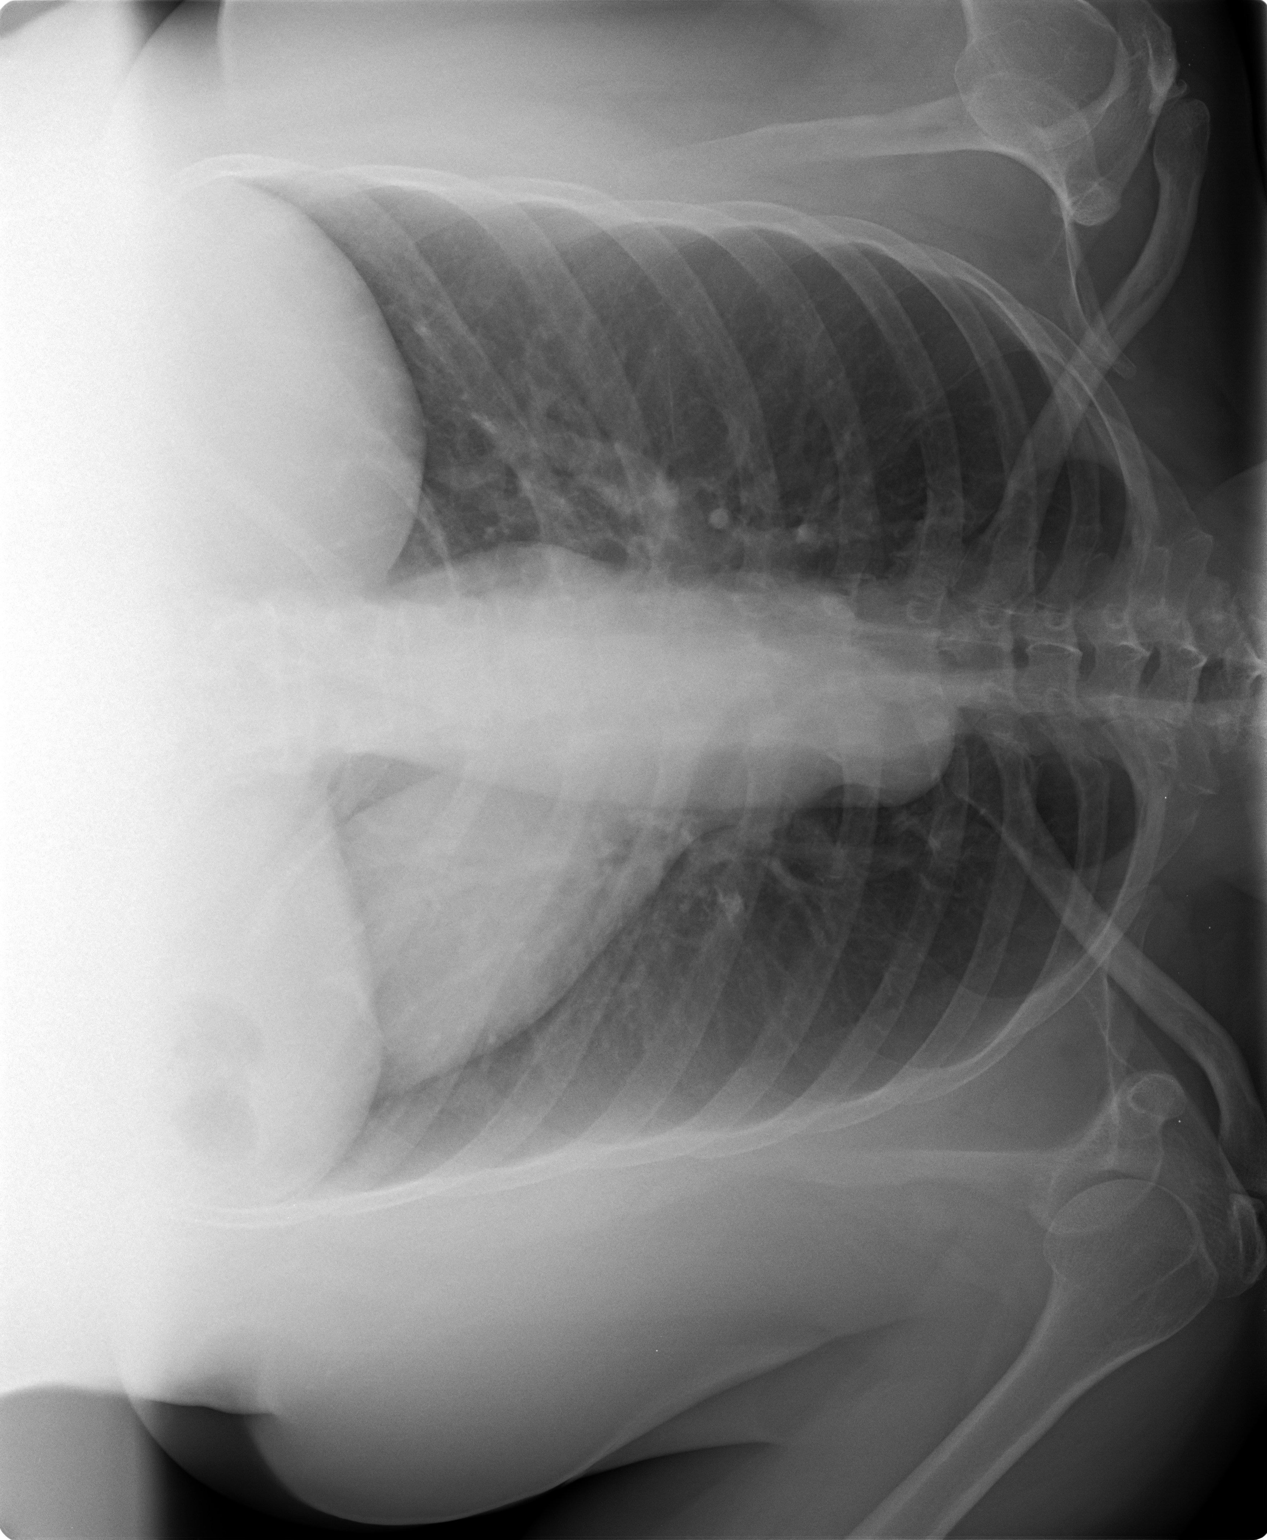

[view not recorded (2 of 2)]
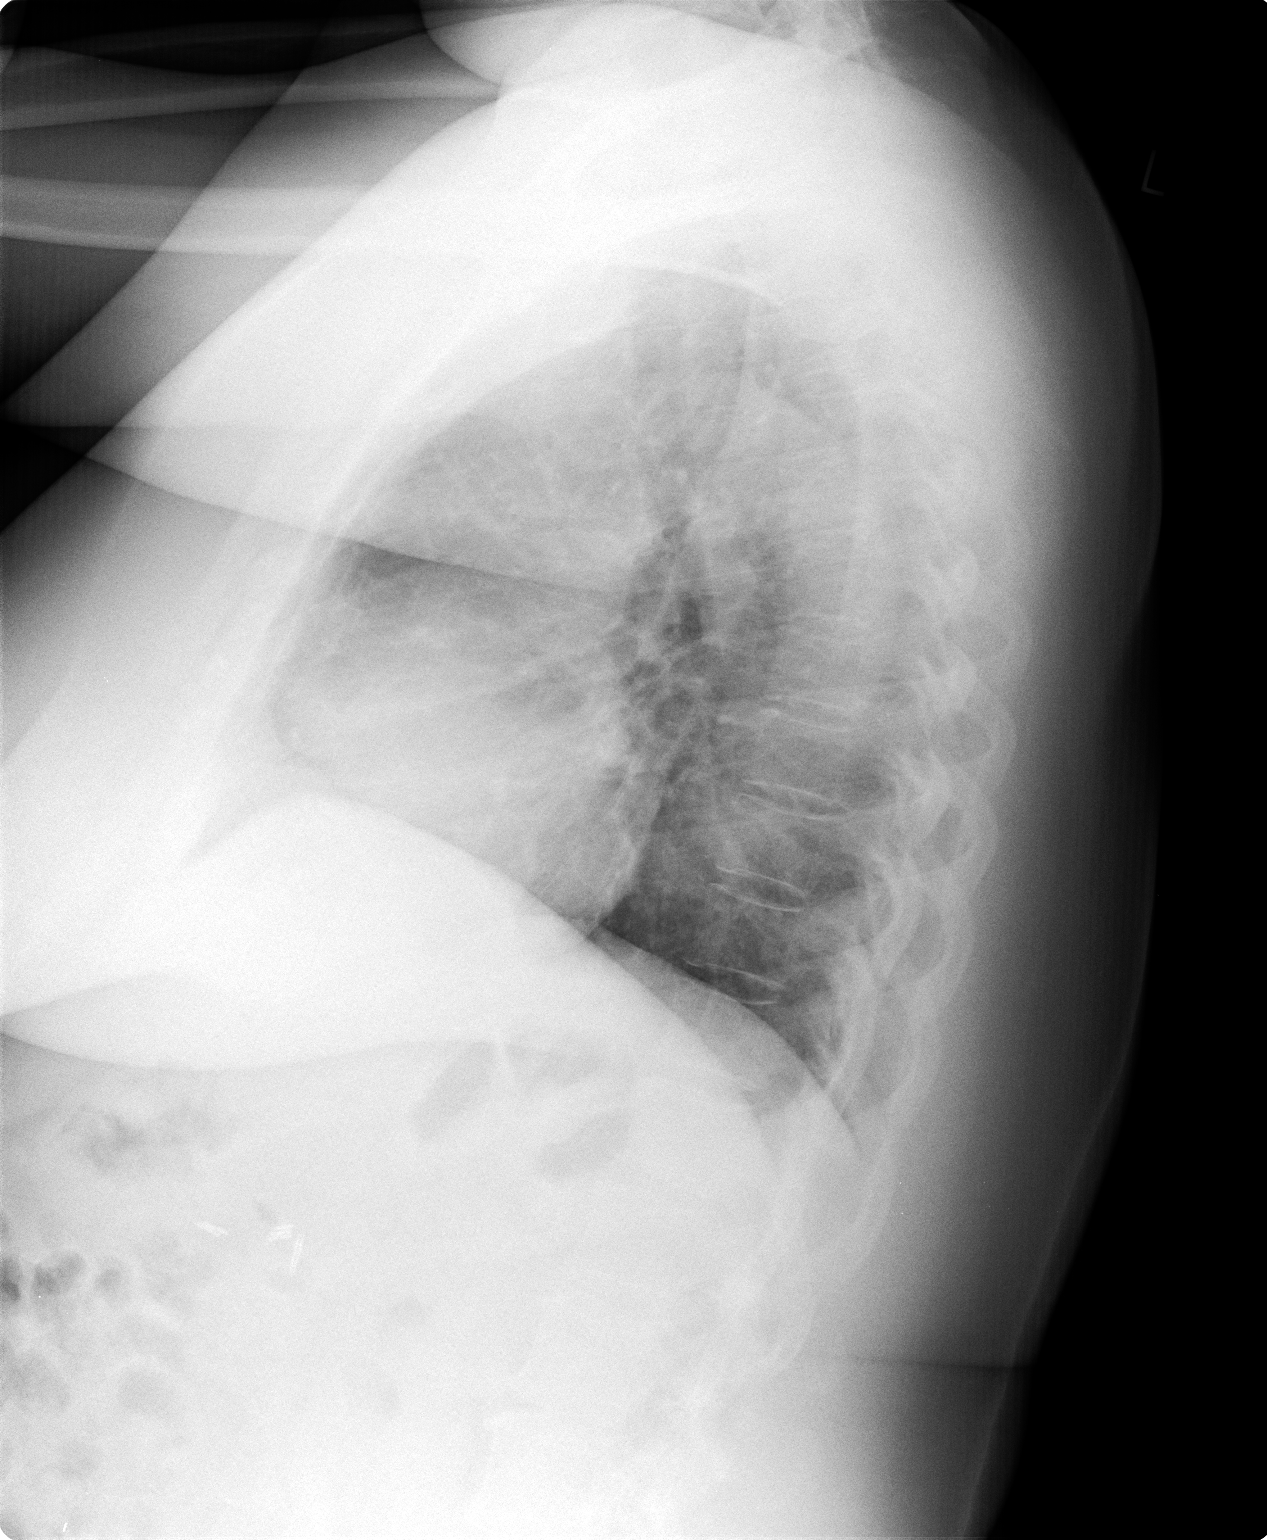

[2 of 2 positions shown; findings below may reference images not displayed]

FINDINGS: Mediastinum hilar structures normal the lungs are clear. Heart size
normal. No pleural effusion or pneumothorax.
IMPRESSION: No active cardiopulmonary disease.

## 2016-07-07 DIAGNOSIS — B399 Histoplasmosis, unspecified: Secondary | ICD-10-CM | POA: Diagnosis not present

## 2016-07-07 DIAGNOSIS — Z7984 Long term (current) use of oral hypoglycemic drugs: Secondary | ICD-10-CM | POA: Diagnosis not present

## 2016-07-07 DIAGNOSIS — H25811 Combined forms of age-related cataract, right eye: Secondary | ICD-10-CM | POA: Diagnosis not present

## 2016-07-07 DIAGNOSIS — E119 Type 2 diabetes mellitus without complications: Secondary | ICD-10-CM | POA: Diagnosis not present

## 2016-07-07 LAB — HM DIABETES EYE EXAM

## 2016-07-31 DIAGNOSIS — H25811 Combined forms of age-related cataract, right eye: Secondary | ICD-10-CM | POA: Diagnosis not present

## 2016-09-17 NOTE — Patient Instructions (Signed)
Your procedure is scheduled on: 09/26/2016  Report to [facility] at  84    AM.  Call this number if you have problems the morning of surgery: [phone]   Do not eat food or drink liquids :After Midnight.      Take these medicines the morning of surgery with A SIP OF WATER: amlodipine, cozaar.   Do not wear jewelry, make-up or nail polish.  Do not wear lotions, powders, or perfumes. You may wear deodorant.  Do not shave 48 hours prior to surgery.  Do not bring valuables to the hospital.  Contacts, dentures or bridgework may not be worn into surgery.  Leave suitcase in the car. After surgery it may be brought to your room.  For patients admitted to the hospital, checkout time is 11:00 AM the day of discharge.   Patients discharged the day of surgery will not be allowed to drive home.  :     Please read over the following fact sheets that you were given: Coughing and Deep Breathing, Surgical Site Infection Prevention, Anesthesia Post-op Instructions and Care and Recovery After Surgery    Cataract A cataract is a clouding of the lens of the eye. When a lens becomes cloudy, vision is reduced based on the degree and nature of the clouding. Many cataracts reduce vision to some degree. Some cataracts make people more near-sighted as they develop. Other cataracts increase glare. Cataracts that are ignored and become worse can sometimes look white. The white color can be seen through the pupil. CAUSES   Aging. However, cataracts may occur at any age, even in newborns.   Certain drugs.   Trauma to the eye.   Certain diseases such as diabetes.   Specific eye diseases such as chronic inflammation inside the eye or a sudden attack of a rare form of glaucoma.   Inherited or acquired medical problems.  SYMPTOMS   Gradual, progressive drop in vision in the affected eye.   Severe, rapid visual loss. This most often happens when trauma is the cause.  DIAGNOSIS  To detect a cataract, an eye  doctor examines the lens. Cataracts are best diagnosed with an exam of the eyes with the pupils enlarged (dilated) by drops.  TREATMENT  For an early cataract, vision may improve by using different eyeglasses or stronger lighting. If that does not help your vision, surgery is the only effective treatment. A cataract needs to be surgically removed when vision loss interferes with your everyday activities, such as driving, reading, or watching TV. A cataract may also have to be removed if it prevents examination or treatment of another eye problem. Surgery removes the cloudy lens and usually replaces it with a substitute lens (intraocular lens, IOL).  At a time when both you and your doctor agree, the cataract will be surgically removed. If you have cataracts in both eyes, only one is usually removed at a time. This allows the operated eye to heal and be out of danger from any possible problems after surgery (such as infection or poor wound healing). In rare cases, a cataract may be doing damage to your eye. In these cases, your caregiver may advise surgical removal right away. The vast majority of people who have cataract surgery have better vision afterward. HOME CARE INSTRUCTIONS  If you are not planning surgery, you may be asked to do the following:  Use different eyeglasses.   Use stronger or brighter lighting.   Ask your eye doctor about reducing your  medicine dose or changing medicines if it is thought that a medicine caused your cataract. Changing medicines does not make the cataract go away on its own.   Become familiar with your surroundings. Poor vision can lead to injury. Avoid bumping into things on the affected side. You are at a higher risk for tripping or falling.   Exercise extreme care when driving or operating machinery.   Wear sunglasses if you are sensitive to bright light or experiencing problems with glare.  SEEK IMMEDIATE MEDICAL CARE IF:   You have a worsening or sudden  vision loss.   You notice redness, swelling, or increasing pain in the eye.   You have a fever.  Document Released: 03/10/2005 Document Revised: 02/27/2011 Document Reviewed: 11/01/2010 [facility] Patient Information 2012 [place].PATIENT INSTRUCTIONS POST-ANESTHESIA  IMMEDIATELY FOLLOWING SURGERY:  Do not drive or operate machinery for the first twenty four hours after surgery.  Do not make any important decisions for twenty four hours after surgery or while taking narcotic pain medications or sedatives.  If you develop intractable nausea and vomiting or a severe headache please notify your doctor immediately.  FOLLOW-UP:  Please make an appointment with your surgeon as instructed. You do not need to follow up with anesthesia unless specifically instructed to do so.  WOUND CARE INSTRUCTIONS (if applicable):  Keep a dry clean dressing on the anesthesia/puncture wound site if there is drainage.  Once the wound has quit draining you may leave it open to air.  Generally you should leave the bandage intact for twenty four hours unless there is drainage.  If the epidural site drains for more than 36-48 hours please call the anesthesia department.  QUESTIONS?:  Please feel free to call your physician or the hospital operator if you have any questions, and they will be happy to assist you.

## 2016-09-19 DIAGNOSIS — H25811 Combined forms of age-related cataract, right eye: Secondary | ICD-10-CM | POA: Diagnosis not present

## 2016-09-22 ENCOUNTER — Encounter (HOSPITAL_COMMUNITY): Payer: Self-pay

## 2016-09-22 ENCOUNTER — Encounter (HOSPITAL_COMMUNITY)
Admission: RE | Admit: 2016-09-22 | Discharge: 2016-09-22 | Disposition: A | Discharge status: Home / Self Care | Payer: BLUE CROSS/BLUE SHIELD | Source: Ambulatory Visit | Attending: Ophthalmology | Admitting: Ophthalmology

## 2016-09-22 ENCOUNTER — Other Ambulatory Visit: Payer: Self-pay

## 2016-09-22 DIAGNOSIS — R9431 Abnormal electrocardiogram [ECG] [EKG]: Secondary | ICD-10-CM | POA: Diagnosis not present

## 2016-09-22 DIAGNOSIS — Z01818 Encounter for other preprocedural examination: Secondary | ICD-10-CM | POA: Insufficient documentation

## 2016-09-22 DIAGNOSIS — H2510 Age-related nuclear cataract, unspecified eye: Secondary | ICD-10-CM | POA: Diagnosis not present

## 2016-09-22 DIAGNOSIS — Z01810 Encounter for preprocedural cardiovascular examination: Secondary | ICD-10-CM | POA: Insufficient documentation

## 2016-09-22 LAB — BASIC METABOLIC PANEL
Anion gap: 9 (ref 5–15)
BUN: 17 mg/dL (ref 6–20)
CALCIUM: 8.6 mg/dL — AB (ref 8.9–10.3)
CHLORIDE: 105 mmol/L (ref 101–111)
CO2: 26 mmol/L (ref 22–32)
CREATININE: 0.9 mg/dL (ref 0.44–1.00)
Glucose, Bld: 149 mg/dL — ABNORMAL HIGH (ref 65–99)
Potassium: 3.5 mmol/L (ref 3.5–5.1)
SODIUM: 140 mmol/L (ref 135–145)

## 2016-09-22 LAB — CBC WITH DIFFERENTIAL/PLATELET
BASOS PCT: 0 %
Basophils Absolute: 0 10*3/uL (ref 0.0–0.1)
EOS ABS: 0.6 10*3/uL (ref 0.0–0.7)
EOS PCT: 5 %
HCT: 38.4 % (ref 36.0–46.0)
HEMOGLOBIN: 13.1 g/dL (ref 12.0–15.0)
Lymphocytes Relative: 34 %
Lymphs Abs: 3.7 10*3/uL (ref 0.7–4.0)
MCH: 26.5 pg (ref 26.0–34.0)
MCHC: 34.1 g/dL (ref 30.0–36.0)
MCV: 77.7 fL — ABNORMAL LOW (ref 78.0–100.0)
MONOS PCT: 6 %
Monocytes Absolute: 0.7 10*3/uL (ref 0.1–1.0)
NEUTROS PCT: 55 %
Neutro Abs: 6.1 10*3/uL (ref 1.7–7.7)
PLATELETS: 222 10*3/uL (ref 150–400)
RBC: 4.94 MIL/uL (ref 3.87–5.11)
RDW: 14.5 % (ref 11.5–15.5)
WBC: 11 10*3/uL — ABNORMAL HIGH (ref 4.0–10.5)

## 2016-09-26 ENCOUNTER — Ambulatory Visit (HOSPITAL_COMMUNITY): Payer: BLUE CROSS/BLUE SHIELD | Admitting: Anesthesiology

## 2016-09-26 ENCOUNTER — Encounter (HOSPITAL_COMMUNITY): Payer: Self-pay | Admitting: Certified Registered Nurse Anesthetist

## 2016-09-26 ENCOUNTER — Ambulatory Visit (HOSPITAL_COMMUNITY)
Admission: RE | Admit: 2016-09-26 | Discharge: 2016-09-26 | Disposition: A | Discharge status: Home / Self Care | Payer: BLUE CROSS/BLUE SHIELD | Source: Ambulatory Visit | Attending: Ophthalmology | Admitting: Ophthalmology

## 2016-09-26 ENCOUNTER — Encounter (HOSPITAL_COMMUNITY)
Admission: RE | Disposition: A | Discharge status: Home / Self Care | Payer: Self-pay | Source: Ambulatory Visit | Attending: Ophthalmology

## 2016-09-26 DIAGNOSIS — H25811 Combined forms of age-related cataract, right eye: Secondary | ICD-10-CM | POA: Diagnosis not present

## 2016-09-26 DIAGNOSIS — I10 Essential (primary) hypertension: Secondary | ICD-10-CM | POA: Insufficient documentation

## 2016-09-26 DIAGNOSIS — E1136 Type 2 diabetes mellitus with diabetic cataract: Secondary | ICD-10-CM | POA: Diagnosis not present

## 2016-09-26 DIAGNOSIS — M792 Neuralgia and neuritis, unspecified: Secondary | ICD-10-CM | POA: Diagnosis not present

## 2016-09-26 DIAGNOSIS — F17200 Nicotine dependence, unspecified, uncomplicated: Secondary | ICD-10-CM | POA: Diagnosis not present

## 2016-09-26 DIAGNOSIS — Z7984 Long term (current) use of oral hypoglycemic drugs: Secondary | ICD-10-CM | POA: Diagnosis not present

## 2016-09-26 DIAGNOSIS — Z961 Presence of intraocular lens: Secondary | ICD-10-CM | POA: Diagnosis not present

## 2016-09-26 DIAGNOSIS — Z79899 Other long term (current) drug therapy: Secondary | ICD-10-CM | POA: Insufficient documentation

## 2016-09-26 DIAGNOSIS — H269 Unspecified cataract: Secondary | ICD-10-CM | POA: Diagnosis not present

## 2016-09-26 HISTORY — PX: CATARACT EXTRACTION W/PHACO: SHX586

## 2016-09-26 LAB — GLUCOSE, CAPILLARY: GLUCOSE-CAPILLARY: 114 mg/dL — AB (ref 65–99)

## 2016-09-26 SURGERY — PHACOEMULSIFICATION, CATARACT, WITH IOL INSERTION
Anesthesia: Monitor Anesthesia Care | Site: Eye | Laterality: Right

## 2016-09-26 MED ORDER — BSS IO SOLN
INTRAOCULAR | Status: DC | PRN
  Administered 2016-09-26: 15 mL via INTRAOCULAR

## 2016-09-26 MED ORDER — EPINEPHRINE PF 1 MG/ML IJ SOLN
INTRAMUSCULAR | Status: AC
  Filled 2016-09-26: qty 1

## 2016-09-26 MED ORDER — TETRACAINE HCL 0.5 % OP SOLN
1.0000 [drp] | OPHTHALMIC | Status: AC
  Administered 2016-09-26 (×3): 1 [drp] via OPHTHALMIC

## 2016-09-26 MED ORDER — LIDOCAINE HCL (PF) 1 % IJ SOLN
INTRAMUSCULAR | Status: AC
  Filled 2016-09-26: qty 2

## 2016-09-26 MED ORDER — PROPOFOL 10 MG/ML IV BOLUS
INTRAVENOUS | Status: AC
  Filled 2016-09-26: qty 20

## 2016-09-26 MED ORDER — SODIUM HYALURONATE 23 MG/ML IO SOLN
INTRAOCULAR | Status: DC | PRN
  Administered 2016-09-26: 0.6 mL via INTRAOCULAR

## 2016-09-26 MED ORDER — MIDAZOLAM HCL 5 MG/5ML IJ SOLN
INTRAMUSCULAR | Status: DC | PRN
  Administered 2016-09-26 (×2): 0.5 mg via INTRAVENOUS

## 2016-09-26 MED ORDER — LIDOCAINE HCL 3.5 % OP GEL
1.0000 "application " | Freq: Once | OPHTHALMIC | Status: AC
  Administered 2016-09-26: 1 via OPHTHALMIC

## 2016-09-26 MED ORDER — NEOMYCIN-POLYMYXIN-DEXAMETH 3.5-10000-0.1 OP SUSP
OPHTHALMIC | Status: AC
  Filled 2016-09-26: qty 5

## 2016-09-26 MED ORDER — EPINEPHRINE PF 1 MG/ML IJ SOLN
INTRAOCULAR | Status: DC | PRN
  Administered 2016-09-26: 500 mL

## 2016-09-26 MED ORDER — MIDAZOLAM HCL 2 MG/2ML IJ SOLN
INTRAMUSCULAR | Status: AC
  Filled 2016-09-26: qty 2

## 2016-09-26 MED ORDER — PROVISC 10 MG/ML IO SOLN
INTRAOCULAR | Status: DC | PRN
  Administered 2016-09-26: 0.85 mL via INTRAOCULAR

## 2016-09-26 MED ORDER — CYCLOPENTOLATE-PHENYLEPHRINE 0.2-1 % OP SOLN
1.0000 [drp] | OPHTHALMIC | Status: AC
  Administered 2016-09-26 (×3): 1 [drp] via OPHTHALMIC

## 2016-09-26 MED ORDER — POVIDONE-IODINE 5 % OP SOLN
OPHTHALMIC | Status: DC | PRN
  Administered 2016-09-26: 1 via OPHTHALMIC

## 2016-09-26 MED ORDER — EPINEPHRINE PF 1 MG/ML IJ SOLN
INTRAMUSCULAR | Status: AC
  Filled 2016-09-26: qty 3

## 2016-09-26 MED ORDER — LACTATED RINGERS IV SOLN
INTRAVENOUS | Status: DC
  Administered 2016-09-26 (×2): via INTRAVENOUS

## 2016-09-26 MED ORDER — MIDAZOLAM HCL 2 MG/2ML IJ SOLN
1.0000 mg | INTRAMUSCULAR | Status: AC
  Administered 2016-09-26: 2 mg via INTRAVENOUS

## 2016-09-26 MED ORDER — PHENYLEPHRINE HCL 2.5 % OP SOLN
1.0000 [drp] | OPHTHALMIC | Status: AC
  Administered 2016-09-26 (×3): 1 [drp] via OPHTHALMIC

## 2016-09-26 MED ORDER — EPINEPHRINE PF 1 MG/ML IJ SOLN
INTRAOCULAR | Status: DC | PRN
  Administered 2016-09-26: 1 mL

## 2016-09-26 MED ORDER — NEOMYCIN-POLYMYXIN-DEXAMETH 3.5-10000-0.1 OP SUSP
OPHTHALMIC | Status: DC | PRN
  Administered 2016-09-26: 2 [drp] via OPHTHALMIC

## 2016-09-26 SURGICAL SUPPLY — 15 items
CLOTH BEACON ORANGE TIMEOUT ST (SAFETY) ×2 IMPLANT
EYE SHIELD UNIVERSAL CLEAR (GAUZE/BANDAGES/DRESSINGS) ×2 IMPLANT
GLOVE BIOGEL PI IND STRL 6.5 (GLOVE) ×1 IMPLANT
GLOVE BIOGEL PI IND STRL 7.0 (GLOVE) ×1 IMPLANT
GLOVE BIOGEL PI INDICATOR 6.5 (GLOVE) ×1
GLOVE BIOGEL PI INDICATOR 7.0 (GLOVE) ×1
LENS ALC ACRYL/TECN (Ophthalmic Related) ×2 IMPLANT
NEEDLE HYPO 18GX1.5 BLUNT FILL (NEEDLE) ×2 IMPLANT
PAD ARMBOARD 7.5X6 YLW CONV (MISCELLANEOUS) ×2 IMPLANT
RING MALYGIN (MISCELLANEOUS) IMPLANT
SYR TB 1ML LL NO SAFETY (SYRINGE) ×2 IMPLANT
TAPE SURG TRANSPORE 1 IN (GAUZE/BANDAGES/DRESSINGS) ×1 IMPLANT
TAPE SURGICAL TRANSPORE 1 IN (GAUZE/BANDAGES/DRESSINGS) ×1
VISCOELASTIC ADDITIONAL (OPHTHALMIC RELATED) ×2 IMPLANT
WATER STERILE IRR 250ML POUR (IV SOLUTION) ×2 IMPLANT

## 2016-09-26 NOTE — Transfer of Care (Signed)
Immediate Anesthesia Transfer of Care Note  Patient: Melanie Maynard  Procedure(s) Performed: Procedure(s) with comments: CATARACT EXTRACTION PHACO AND INTRAOCULAR LENS PLACEMENT (IOC) (Right) - CDE: 3.16  Patient Location: PACU  Anesthesia Type:MAC  Level of Consciousness: awake, alert , oriented and patient cooperative  Airway & Oxygen Therapy: Patient Spontanous Breathing  Post-op Assessment: Report given to RN and Post -op Vital signs reviewed and stable  Post vital signs: Reviewed and stable 62 NSR, 96% spo2 on RA, 134/80  Last Vitals:  Vitals:   09/26/16 0700 09/26/16 0715  BP: (!) 157/89 140/90  Resp:  19  Temp:      Last Pain:  Vitals:   09/26/16 0643  TempSrc: Oral      Patients Stated Pain Goal: 4 (78/29/56 2130)  Complications: No apparent anesthesia complications

## 2016-09-26 NOTE — Discharge Instructions (Signed)
PATIENT INSTRUCTIONS POST-ANESTHESIA  IMMEDIATELY FOLLOWING SURGERY:  Do not drive or operate machinery for the first twenty four hours after surgery.  Do not make any important decisions for twenty four hours after surgery or while taking narcotic pain medications or sedatives.  If you develop intractable nausea and vomiting or a severe headache please notify your doctor immediately.  FOLLOW-UP:  Please make an appointment with your surgeon as instructed. You do not need to follow up with anesthesia unless specifically instructed to do so.  WOUND CARE INSTRUCTIONS (if applicable):  Keep a dry clean dressing on the anesthesia/puncture wound site if there is drainage.  Once the wound has quit draining you may leave it open to air.  Generally you should leave the bandage intact for twenty four hours unless there is drainage.  If the epidural site drains for more than 36-48 hours please call the anesthesia department.  QUESTIONS?:  Please feel free to call your physician or the hospital operator if you have any questions, and they will be happy to assist you.      Please discharge patient when stable, will follow up today with Dr. Marisa Hua at the [facility] office at [place] in place until visit.  All paperwork with discharge instructions will be given at the office.

## 2016-09-26 NOTE — Anesthesia Preprocedure Evaluation (Signed)
Anesthesia Evaluation  Patient identified by MRN, date of birth, ID band Patient awake    Reviewed: Allergy & Precautions, NPO status , Patient's Chart, lab work & pertinent test results  Airway Mallampati: II  TM Distance: >3 FB     Dental  (+) Teeth Intact, Partial Upper   Pulmonary Current Smoker,    breath sounds clear to auscultation       Cardiovascular hypertension, Pt. on medications  Rhythm:Regular Rate:Normal     Neuro/Psych Seizures -,   Neuromuscular disease ( Neuralgia of right lower extremity)    GI/Hepatic negative GI ROS,   Endo/Other  diabetes, Type 2, Oral Hypoglycemic Agents  Renal/GU      Musculoskeletal   Abdominal   Peds  Hematology   Anesthesia Other Findings   Reproductive/Obstetrics                             Anesthesia Physical Anesthesia Plan  ASA: III  Anesthesia Plan: MAC   Post-op Pain Management:    Induction: Intravenous  PONV Risk Score and Plan:   Airway Management Planned: Nasal Cannula  Additional Equipment:   Intra-op Plan:   Post-operative Plan:   Informed Consent: I have reviewed the patients History and Physical, chart, labs and discussed the procedure including the risks, benefits and alternatives for the proposed anesthesia with the patient or authorized representative who has indicated his/her understanding and acceptance.     Plan Discussed with:   Anesthesia Plan Comments:         Anesthesia Quick Evaluation

## 2016-09-26 NOTE — Anesthesia Postprocedure Evaluation (Signed)
Anesthesia Post Note  Patient: Melanie Maynard  Procedure(s) Performed: Procedure(s) (LRB): CATARACT EXTRACTION PHACO AND INTRAOCULAR LENS PLACEMENT (IOC) (Right)  Patient location during evaluation: PACU Anesthesia Type: MAC Level of consciousness: awake, awake and alert, oriented and patient cooperative Pain management: pain level controlled Vital Signs Assessment: post-procedure vital signs reviewed and stable Respiratory status: spontaneous breathing, nonlabored ventilation and respiratory function stable Cardiovascular status: stable Postop Assessment: no signs of nausea or vomiting Anesthetic complications: no     Last Vitals:  Vitals:   09/26/16 0700 09/26/16 0715  BP: (!) 157/89 140/90  Resp:  19  Temp:      Last Pain:  Vitals:   09/26/16 0643  TempSrc: Oral                 [name] L

## 2016-09-26 NOTE — Op Note (Signed)
Date of procedure: 09/26/16  Pre-operative diagnosis: Visually significant cataract, Right Eye  Post-operative diagnosis: Visually significant cataract, Right Eye  Procedure: Removal of cataract via phacoemulsification and insertion of intra-ocular lens AMO PCB00  +20.0D into the capsular bag of the Right Eye  Attending surgeon: Gerda Diss. Edilia Ghuman, MD, MA  Anesthesia: MAC, Topical Akten  Complications: None  Estimated Blood Loss: <34m (minimal)  Specimens: None  Implants: As above  Indications:  Visually significant cataract, Right Eye  Procedure:  The patient was seen and identified in the pre-operative area. The operative eye was identified and dilated.  The operative eye was marked.  Topical anesthesia was administered to the operative eye.     The patient was then to the operative suite and placed in the supine position.  A timeout was performed confirming the patient, procedure to be performed, and all other relevant information.   The patient's face was prepped and draped in the usual fashion for intra-ocular surgery.  A lid speculum was placed into the operative eye and the surgical microscope moved into place and focused.  A superotemporal paracentesis was created using a 20 gauge paracentesis blade.  Shugarcaine was injected into the anterior chamber.  Viscoelastic was injected into the anterior chamber.  A temporal clear-corneal main wound incision was created using a 2.439mmicrokeratome.  A continuous curvilinear capsulorrhexis was initiated using an irrigating cystitome and completed using capsulorrhexis forceps.  Hydrodissection and hydrodeliniation were performed.  Viscoelastic was injected into the anterior chamber.  A phacoemulsification handpiece and a chopper as a second instrument were used to remove the nucleus and epinucleus. The irrigation/aspiration handpiece was used to remove any remaining cortical material.   The capsular bag was reinflated with viscoelastic,  checked, and found to be intact.  The intraocular lens was inserted into the capsular bag and dialed into place using a Kuglen hook.  The irrigation/aspiration handpiece was used to remove any remaining viscoelastic.  The clear corneal wound and paracentesis wounds were then hydrated and checked with Weck-Cels to be watertight.  The lid-speculum and drape was removed, and the patient's face was cleaned with a wet and dry 4x4.  Maxitrol was instilled in the eye before a clear shield was taped over the eye. The patient was taken to the post-operative care unit in good condition, having tolerated the procedure well.  Post-Op Instructions: [facility] and will receive all other orders and instructions.

## 2016-09-26 NOTE — H&P (Signed)
The H and P was reviewed and updated. The patient was examined.  No changes were found after exam.  The surgical eye was marked.  

## 2016-09-29 ENCOUNTER — Encounter (HOSPITAL_COMMUNITY): Payer: Self-pay | Admitting: Ophthalmology

## 2016-10-08 ENCOUNTER — Encounter: Payer: Self-pay | Admitting: Nurse Practitioner

## 2016-10-08 ENCOUNTER — Ambulatory Visit (INDEPENDENT_AMBULATORY_CARE_PROVIDER_SITE_OTHER): Payer: BLUE CROSS/BLUE SHIELD | Admitting: Nurse Practitioner

## 2016-10-08 VITALS — BP 130/86 | HR 84 | Wt 190.8 lb

## 2016-10-08 DIAGNOSIS — R569 Unspecified convulsions: Secondary | ICD-10-CM

## 2016-10-08 MED ORDER — PHENYTOIN SODIUM EXTENDED 100 MG PO CAPS: 300.0000 mg | ORAL_CAPSULE | Freq: Every day | ORAL | 3 refills | Status: DC

## 2016-10-08 NOTE — Progress Notes (Signed)
[facility]  PATIENT: Melanie Maynard DOB: [birth year]   REASON FOR VISIT: Follow-up for seizure disorder HISTORY FROM: Patient    HISTORY OF PRESENT ILLNESS:Melanie Maynard, 56 year old black female returns for  yearly followup. She has a history of seizure disorder, last seizure was approximately 22 years ago. She is currently on Dilantin without side effects. She denies daytime drowsiness or feelings of being off balance. She has had no evidence of bowel or bladder incontinence, no tongue biting, no jerking of extremities. Sleeping well, appetite is reportedly good. Medical history also includes hypertension and elevated cholesterol. Blood pressure  In the office today 130/86.Recent labs at primary care in March were reviewed CBC and CMP.  She had cataract surgery on 09/22/16.   She returns for reevaluation and refills   REVIEW OF SYSTEMS: Full 14 system review of systems performed and notable only for those listed, all others are neg:  Constitutional: neg  Cardiovascular: neg Ear/Nose/Throat: neg  Skin: neg Eyes: neg Respiratory: neg Gastroitestinal: neg  Hematology/Lymphatic: neg  Endocrine: neg Musculoskeletal:neg Allergy/Immunology: neg Neurological: neg Psychiatric: neg Sleep : neg   ALLERGIES: Allergies  Allergen Reactions  . Gabapentin Other (See Comments)    Chest pain    HOME MEDICATIONS: Outpatient Medications Prior to Visit  Medication Sig Dispense Refill  . amLODipine (NORVASC) 5 MG tablet Take 1 tablet (5 mg total) by mouth daily. 90 tablet 1  . atorvastatin (LIPITOR) 40 MG tablet Take 1 tablet (40 mg total) by mouth daily. 90 tablet 1  . glucose blood (ONETOUCH VERIO) test strip Test 1x per day and prn  Dx e11.9 100 each 12  . losartan (COZAAR) 100 MG tablet Take 1 tablet (100 mg total) by mouth daily. 90 tablet 1  . metFORMIN (GLUCOPHAGE XR) 500 MG 24 hr tablet Take 1 tablet (500 mg total) by mouth every evening. With your evening meal 90  tablet 1  . ONETOUCH DELICA LANCETS 78L MISC 1 each by Does not apply route daily. Test 1x per day and prn  Dx E11.9 100 each 3  . phenytoin (DILANTIN) 100 MG ER capsule Take 3 capsules (300 mg total) by mouth at bedtime. 270 capsule 3   No facility-administered medications prior to visit.     PAST MEDICAL HISTORY: Past Medical History:  Diagnosis Date  . Diabetes mellitus without complication ([place])   . Hypertension   . Seizures ([place])     PAST SURGICAL HISTORY: Past Surgical History:  Procedure Laterality Date  . ABDOMINAL HYSTERECTOMY    . APPENDECTOMY    . CATARACT EXTRACTION W/PHACO Right 09/26/2016   Procedure: CATARACT EXTRACTION PHACO AND INTRAOCULAR LENS PLACEMENT (IOC);  Surgeon: Baruch Goldmann, MD;  Location: [facility];  Service: Ophthalmology;  Laterality: Right;  CDE: 3.16  . CHOLECYSTECTOMY      FAMILY HISTORY: Family History  Problem Relation Age of Onset  . Diabetes Mother   . Heart attack Mother 76  . Healthy Father   . Alcohol abuse Father   . Diabetes Sister   . Cancer Sister        breast  . Cirrhosis Brother   . Leukemia Sister   . Diabetes Sister   . Kidney disease Sister        on dialysis    SOCIAL HISTORY: Social History   Social History  . Marital status: Significant Other    Spouse name: N/A  . Number of children: 0  . Years of education: 12   Occupational History  .  [place] History Main Topics  . Smoking status: Current Every Day Smoker    Packs/day: 1.00    Types: Cigarettes  . Smokeless tobacco: Never Used  . Alcohol use No  . Drug use: No  . Sexual activity: Not on file   Other Topics Concern  . Not on file   Social History Narrative   Patient is single with no children   Patient is right handed   Patient has a high school education   Patient drinks 2-3 cups daily     PHYSICAL EXAM  Vitals:   10/08/16 0929  BP: 130/86  Pulse: 84  Weight: 190 lb 12.8 oz (86.5 kg)   Body mass index is 32.75  kg/m. General: well developed, well nourished, seated, in no evident distress  Head: head normocephalic and atraumatic. Oropharynx benign  Neck: supple  Neurologic Exam  Mental Status: Awake and fully alert. Oriented to place and time. Follows all commands. Speech and language normal.  Cranial Nerves: Pupils equal, briskly reactive to light. Extraocular movements full without nystagmus. Visual fields full to confrontation. Hearing intact and symmetric to finger snap. Facial sensation intact. Face, tongue, palate move normally and symmetrically. Neck flexion and extension normal.  Motor: Normal bulk and tone. Normal strength in all tested extremity muscles.No focal weakness  Coordination: Rapid alternating movements normal in all extremities. Finger-to-nose and heel-to-shin performed accurately bilaterally. No dysmetria  Gait and Station: Arises from chair without difficulty. Stance is normal. Gait demonstrates normal stride length and balance . Able to heel, toe and tandem walk without difficulty.  Reflexes: 1+ and symmetric. Toes downgoing.    DIAGNOSTIC DATA (LABS, IMAGING, TESTING) - I reviewed patient records, labs, notes, testing and imaging myself where available.      Component Value Date/Time   NA 140 09/22/2016 0801   NA 143 06/05/2016 1613   K 3.5 09/22/2016 0801   CL 105 09/22/2016 0801   CO2 26 09/22/2016 0801   GLUCOSE 149 (H) 09/22/2016 0801   BUN 17 09/22/2016 0801   BUN 15 06/05/2016 1613   CREATININE 0.90 09/22/2016 0801   CREATININE 0.77 09/22/2012 1213   CALCIUM 8.6 (L) 09/22/2016 0801   PROT 6.7 06/05/2016 1613   ALBUMIN 3.9 06/05/2016 1613   AST 15 06/05/2016 1613   ALT 10 06/05/2016 1613   ALKPHOS 197 (H) 06/05/2016 1613   BILITOT 0.3 06/05/2016 1613   GFRNONAA >60 09/22/2016 0801   GFRNONAA 89 09/22/2012 1213   GFRAA >60 09/22/2016 0801   GFRAA >89 09/22/2012 1213   Lab Results  Component Value Date   CHOL 138 06/05/2016   HDL 49  06/05/2016   LDLCALC 55 06/05/2016   TRIG 169 (H) 06/05/2016   CHOLHDL 2.8 06/05/2016   Lab Results  Component Value Date   HGBA1C 7.3 04/24/2015    ASSESSMENT AND PLAN 56y.o. year old female  has a past medical history of Hypertension and Seizures here to follow-up. No seizure activity in 22 years. Currently on Dilantin 300 daily wihout side effects.  Continue Dilantin at current dose will refill 3 month with 3 refills Call for any seizure activity  Reviewed recent CBC done on September 22, 2016  and [facility] March 2018. from primary care for adverse side effects to Dilantin  Follow-up yearly and when necessary Dennie Bible, [facility], [facility], APRN  [facility] [address], [place], [place] [address] [phone]

## 2016-10-08 NOTE — Patient Instructions (Signed)
Continue Dilantin at current dose will refill 3 month with 3 refills Call for any seizure activity Follow-up yearly and when necessary

## 2016-10-08 NOTE — Progress Notes (Signed)
I have read the note, and I agree with the clinical assessment and plan.  Arlinda Barcelona KEITH   

## 2016-10-31 DIAGNOSIS — H5203 Hypermetropia, bilateral: Secondary | ICD-10-CM | POA: Diagnosis not present

## 2016-10-31 DIAGNOSIS — H524 Presbyopia: Secondary | ICD-10-CM | POA: Diagnosis not present

## 2016-10-31 DIAGNOSIS — H5210 Myopia, unspecified eye: Secondary | ICD-10-CM | POA: Diagnosis not present

## 2016-10-31 DIAGNOSIS — H52222 Regular astigmatism, left eye: Secondary | ICD-10-CM | POA: Diagnosis not present

## 2016-10-31 LAB — HM DIABETES EYE EXAM

## 2016-12-08 ENCOUNTER — Encounter: Payer: Self-pay | Admitting: Nurse Practitioner

## 2016-12-08 ENCOUNTER — Ambulatory Visit (INDEPENDENT_AMBULATORY_CARE_PROVIDER_SITE_OTHER): Payer: BLUE CROSS/BLUE SHIELD | Admitting: Nurse Practitioner

## 2016-12-08 VITALS — BP 151/97 | HR 72 | Temp 97.3°F | Ht 64.0 in | Wt 190.0 lb

## 2016-12-08 DIAGNOSIS — E119 Type 2 diabetes mellitus without complications: Secondary | ICD-10-CM | POA: Diagnosis not present

## 2016-12-08 DIAGNOSIS — I10 Essential (primary) hypertension: Secondary | ICD-10-CM | POA: Diagnosis not present

## 2016-12-08 DIAGNOSIS — R569 Unspecified convulsions: Secondary | ICD-10-CM | POA: Diagnosis not present

## 2016-12-08 DIAGNOSIS — E785 Hyperlipidemia, unspecified: Secondary | ICD-10-CM | POA: Diagnosis not present

## 2016-12-08 DIAGNOSIS — E559 Vitamin D deficiency, unspecified: Secondary | ICD-10-CM | POA: Diagnosis not present

## 2016-12-08 LAB — BAYER DCA HB A1C WAIVED: HB A1C: 6.9 % (ref <7.0)

## 2016-12-08 MED ORDER — LOSARTAN POTASSIUM 100 MG PO TABS: 100.0000 mg | ORAL_TABLET | Freq: Every day | ORAL | 1 refills | Status: DC

## 2016-12-08 MED ORDER — AMLODIPINE BESYLATE 5 MG PO TABS: 5.0000 mg | ORAL_TABLET | Freq: Every day | ORAL | 1 refills | Status: DC

## 2016-12-08 MED ORDER — METFORMIN HCL ER 500 MG PO TB24: 500.0000 mg | ORAL_TABLET | Freq: Every evening | ORAL | 1 refills | Status: DC

## 2016-12-08 MED ORDER — ATORVASTATIN CALCIUM 40 MG PO TABS: 40.0000 mg | ORAL_TABLET | Freq: Every day | ORAL | 1 refills | Status: DC

## 2016-12-08 MED ORDER — PHENYTOIN SODIUM EXTENDED 100 MG PO CAPS: 300.0000 mg | ORAL_CAPSULE | Freq: Every day | ORAL | 3 refills | Status: DC

## 2016-12-08 NOTE — Patient Instructions (Signed)
Calcium; Vitamin D oral tablets What is this medicine? CALCIUM; VITAMIN D (KAL see um; VYE ta min D) is a vitamin supplement. It is used to prevent conditions of low calcium and vitamin D. This medicine may be used for other purposes; ask your health care provider or pharmacist if you have questions. COMMON BRAND NAME(S): Calcarb 600 with Vitamin D, Calcet Plus Vitamin D, Calcitrate + D, Calcium Citrate + D3 Maximum, Calcium Citrate Maximum with D, Caltrate, Caltrate 600+D, Citracal + D, Citracal MAXIMUM + D, Citracal Petites with Vitamin D, Citrus Calcium Plus D, OSCAL 500 + D, OSCAL Calcium + D3, OSCAL Extra D3, Osteo-Poretical, Oysco 500 + D, Oysco D, Oystercal-D Calcium What should I tell my health care provider before I take this medicine? They need to know if you have any of these conditions: -constipation -dehydration -heart disease -high level of calcium or vitamin D in the blood -high level of phosphate in the blood -kidney disease -kidney stones -liver disease -parathyroid disease -sarcoidosis -stomach ulcer or obstruction -an unusual or allergic reaction to calcium, vitamin D, tartrazine dye, other medicines, foods, dyes, or preservatives -pregnant or trying to get pregnant -breast-feeding How should I use this medicine? Take this medicine by mouth with a glass of water. Follow the directions on the label. Take with food or within 1 hour after a meal. Take your medicine at regular intervals. Do not take your medicine more often than directed. Talk to your pediatrician regarding the use of this medicine in children. While this medicine may be used in children for selected conditions, precautions do apply. Overdosage: If you think you have taken too much of this medicine contact a poison control center or emergency room at once. NOTE: This medicine is only for you. Do not share this medicine with others. What if I miss a dose? If you miss a dose, take it as soon as you can. If it  is almost time for your next dose, take only that dose. Do not take double or extra doses. What may interact with this medicine? Do not take this medicine with any of the following medications: -ammonium chloride -methenamine This medicine may also interact with the following medications: -antibiotics like ciprofloxacin, gatifloxacin, tetracycline -captopril -delavirdine -diuretics -gabapentin -iron supplements -medicines for fungal infections like ketoconazole and itraconazole -medicines for seizures like ethotoin and phenytoin -mineral oil -mycophenolate -other vitamins with calcium, vitamin D, or minerals -quinidine -rosuvastatin -sucralfate -thyroid medicine This list may not describe all possible interactions. Give your health care provider a list of all the medicines, herbs, non-prescription drugs, or dietary supplements you use. Also tell them if you smoke, drink alcohol, or use illegal drugs. Some items may interact with your medicine. What should I watch for while using this medicine? Taking this medicine is not a substitute for a well-balanced diet and exercise. Talk with your doctor or health care provider and follow a healthy lifestyle. Do not take this medicine with high-fiber foods, large amounts of alcohol, or drinks containing caffeine. Do not take this medicine within 2 hours of any other medicines. What side effects may I notice from receiving this medicine? Side effects that you should report to your doctor or health care professional as soon as possible: -allergic reactions like skin rash, itching or hives, swelling of the face, lips, or tongue -confusion -dry mouth -high blood pressure -increased hunger or thirst -increased urination -irregular heartbeat -metallic taste -muscle or bone pain -pain when urinating -seizure -unusually weak or tired -weight loss  Side effects that usually do not require medical attention (report to your doctor or health care  professional if they continue or are bothersome): -constipation -diarrhea -headache -loss of appetite -nausea, vomiting -stomach upset This list may not describe all possible side effects. Call your doctor for medical advice about side effects. You may report side effects to FDA at 1-800-FDA-1088. Where should I keep my medicine? Keep out of the reach of children. Store at room temperature between 15 and 30 degrees C (59 and 86 degrees F). Protect from light. Keep container tightly closed. Throw away any unused medicine after the expiration date. NOTE: This sheet is a summary. It may not cover all possible information. If you have questions about this medicine, talk to your doctor, pharmacist, or health care provider.  2018 Elsevier/Gold Standard (2007-06-23 17:56:23)

## 2016-12-08 NOTE — Progress Notes (Signed)
Subjective:    Patient ID: Melanie Maynard, female    DOB: 09-15-1960, 56 y.o.   MRN: [id number]  HPI  Melanie Maynard is here today for follow up of chronic medical problem.  Outpatient Encounter Prescriptions as of 12/08/2016  Medication Sig  . amLODipine (NORVASC) 5 MG tablet Take 1 tablet (5 mg total) by mouth daily.  Marland Kitchen atorvastatin (LIPITOR) 40 MG tablet Take 1 tablet (40 mg total) by mouth daily.  Marland Kitchen glucose blood (ONETOUCH VERIO) test strip Test 1x per day and prn  Dx e11.9  . losartan (COZAAR) 100 MG tablet Take 1 tablet (100 mg total) by mouth daily.  . metFORMIN (GLUCOPHAGE XR) 500 MG 24 hr tablet Take 1 tablet (500 mg total) by mouth every evening. With your evening meal  . ONETOUCH DELICA LANCETS 33A MISC 1 each by Does not apply route daily. Test 1x per day and prn  Dx E11.9  . phenytoin (DILANTIN) 100 MG ER capsule Take 3 capsules (300 mg total) by mouth at bedtime.   No facility-administered encounter medications on file as of 12/08/2016.     1. Essential hypertension  No c/o chest pain, sob or headache. Does not check blood pressure at home  2. Type 2 diabetes mellitus treated without insulin (HCC)  last HGBA1C was 6.9%. Does not check blood sugars daily.  3. Vitamin D deficiency  Has not been taking vitamin d OTC  4. Seizures ([place])  Has not had seizure in quite awhile  5. Morbid obesity ([place])  No recent weight changes  6. Hyperlipidemia with target LDL less than 100  Does not really watch diet    New complaints: None today  Social history: Lives alone.    Review of Systems  Constitutional: Negative for activity change and appetite change.  HENT: Negative.   Eyes: Negative for pain.  Respiratory: Negative for shortness of breath.   Cardiovascular: Negative for chest pain, palpitations and leg swelling.  Gastrointestinal: Negative for abdominal pain.  Endocrine: Negative for polydipsia.  Genitourinary: Negative.   Skin: Negative for rash.  Neurological:  Negative for dizziness, weakness and headaches.  Hematological: Does not bruise/bleed easily.  Psychiatric/Behavioral: Negative.   All other systems reviewed and are negative.      Objective:   Physical Exam  Constitutional: She is oriented to person, place, and time. She appears well-developed and well-nourished.  HENT:  Nose: Nose normal.  Mouth/Throat: Oropharynx is clear and moist.  Eyes: EOM are normal.  Neck: Trachea normal, normal range of motion and full passive range of motion without pain. Neck supple. No JVD present. Carotid bruit is not present. No thyromegaly present.  Cardiovascular: Normal rate, regular rhythm, normal heart sounds and intact distal pulses.  Exam reveals no gallop and no friction rub.   No murmur heard. Pulmonary/Chest: Effort normal and breath sounds normal.  Abdominal: Soft. Bowel sounds are normal. She exhibits no distension and no mass. There is no tenderness.  Musculoskeletal: Normal range of motion.  Lymphadenopathy:    She has no cervical adenopathy.  Neurological: She is alert and oriented to person, place, and time. She has normal reflexes.  Skin: Skin is warm and dry.  Psychiatric: She has a normal mood and affect. Her behavior is normal. Judgment and thought content normal.    BP (!) 151/97   Pulse 72   Temp (!) 97.3 F (36.3 C) (Oral)   Ht _0  (1.626 m)   Wt 190 lb (86.2 kg)  LMP 01/23/2001 (Approximate)   BMI 32.61 kg/m   hgba1c 6.9%      Assessment & Plan:  1. Essential hypertension Low sodium diet - CMP14+EGFR - amLODipine (NORVASC) 5 MG tablet; Take 1 tablet (5 mg total) by mouth daily.  Dispense: 90 tablet; Refill: 1 - losartan (COZAAR) 100 MG tablet; Take 1 tablet (100 mg total) by mouth daily.  Dispense: 90 tablet; Refill: 1  2. Type 2 diabetes mellitus treated without insulin (HCC) Continue to watch carbs in diet - Bayer DCA Hb A1c Waived - metFORMIN (GLUCOPHAGE XR) 500 MG 24 hr tablet; Take 1 tablet (500 mg  total) by mouth every evening. With your evening meal  Dispense: 90 tablet; Refill: 1  3. Vitamin D deficiency Needs vitamin d OTC daily  4. Seizures (HCC) - phenytoin (DILANTIN) 100 MG ER capsule; Take 3 capsules (300 mg total) by mouth at bedtime.  Dispense: 270 capsule; Refill: 3  5. Morbid obesity ([place]) Discussed diet and exercise for person with BMI >25 Will recheck weight in 3-6 months  6. Hyperlipidemia with target LDL less than 100 Low fat diet - Lipid panel - atorvastatin (LIPITOR) 40 MG tablet; Take 1 tablet (40 mg total) by mouth daily.  Dispense: 90 tablet; Refill: 1    Labs pending Health maintenance reviewed Diet and exercise encouraged Continue all meds Follow up  In 3 months   [place], FNP

## 2016-12-09 LAB — CMP14+EGFR
A/G RATIO: 1.7 (ref 1.2–2.2)
ALT: 8 IU/L (ref 0–32)
AST: 17 IU/L (ref 0–40)
Albumin: 4 g/dL (ref 3.5–5.5)
Alkaline Phosphatase: 191 IU/L — ABNORMAL HIGH (ref 39–117)
BUN/Creatinine Ratio: 20 (ref 9–23)
BUN: 16 mg/dL (ref 6–24)
Bilirubin Total: <0.2 mg/dL (ref 0.0–1.2)
CALCIUM: 8.6 mg/dL — AB (ref 8.7–10.2)
CHLORIDE: 103 mmol/L (ref 96–106)
CO2: 19 mmol/L — ABNORMAL LOW (ref 20–29)
Creatinine, Ser: 0.8 mg/dL (ref 0.57–1.00)
GFR calc Af Amer: 95 mL/min/{1.73_m2} (ref >59)
GFR, EST NON AFRICAN AMERICAN: 83 mL/min/{1.73_m2} (ref >59)
GLOBULIN, TOTAL: 2.4 g/dL (ref 1.5–4.5)
Glucose: 133 mg/dL — ABNORMAL HIGH (ref 65–99)
POTASSIUM: 4.5 mmol/L (ref 3.5–5.2)
SODIUM: 139 mmol/L (ref 134–144)
Total Protein: 6.4 g/dL (ref 6.0–8.5)

## 2016-12-09 LAB — LIPID PANEL
CHOL/HDL RATIO: 3 ratio (ref 0.0–4.4)
Cholesterol, Total: 134 mg/dL (ref 100–199)
HDL: 45 mg/dL (ref >39)
LDL Calculated: 59 mg/dL (ref 0–99)
TRIGLYCERIDES: 150 mg/dL — AB (ref 0–149)
VLDL Cholesterol Cal: 30 mg/dL (ref 5–40)

## 2017-03-10 ENCOUNTER — Encounter: Payer: Self-pay | Admitting: Nurse Practitioner

## 2017-03-10 ENCOUNTER — Ambulatory Visit: Payer: BLUE CROSS/BLUE SHIELD | Admitting: Nurse Practitioner

## 2017-03-10 VITALS — BP 133/85 | HR 78 | Temp 97.3°F | Ht 64.0 in | Wt 194.0 lb

## 2017-03-10 DIAGNOSIS — E785 Hyperlipidemia, unspecified: Secondary | ICD-10-CM | POA: Diagnosis not present

## 2017-03-10 DIAGNOSIS — E559 Vitamin D deficiency, unspecified: Secondary | ICD-10-CM | POA: Diagnosis not present

## 2017-03-10 DIAGNOSIS — G5791 Unspecified mononeuropathy of right lower limb: Secondary | ICD-10-CM | POA: Diagnosis not present

## 2017-03-10 DIAGNOSIS — L659 Nonscarring hair loss, unspecified: Secondary | ICD-10-CM

## 2017-03-10 DIAGNOSIS — I10 Essential (primary) hypertension: Secondary | ICD-10-CM

## 2017-03-10 DIAGNOSIS — R569 Unspecified convulsions: Secondary | ICD-10-CM | POA: Diagnosis not present

## 2017-03-10 DIAGNOSIS — M792 Neuralgia and neuritis, unspecified: Secondary | ICD-10-CM

## 2017-03-10 DIAGNOSIS — E119 Type 2 diabetes mellitus without complications: Secondary | ICD-10-CM | POA: Diagnosis not present

## 2017-03-10 LAB — BAYER DCA HB A1C WAIVED: HB A1C (BAYER DCA - WAIVED): 6.6 % (ref <7.0)

## 2017-03-10 NOTE — Progress Notes (Signed)
Subjective:    Patient ID: Melanie Maynard, female    DOB: 02-24-1961, 56 y.o.   MRN: [id number]  HPI  Melanie Maynard is here today for follow up of chronic medical problem.  Outpatient Encounter Medications as of 03/10/2017  Medication Sig  . amLODipine (NORVASC) 5 MG tablet Take 1 tablet (5 mg total) by mouth daily.  Marland Kitchen atorvastatin (LIPITOR) 40 MG tablet Take 1 tablet (40 mg total) by mouth daily.  Marland Kitchen glucose blood (ONETOUCH VERIO) test strip Test 1x per day and prn  Dx e11.9  . losartan (COZAAR) 100 MG tablet Take 1 tablet (100 mg total) by mouth daily.  . metFORMIN (GLUCOPHAGE XR) 500 MG 24 hr tablet Take 1 tablet (500 mg total) by mouth every evening. With your evening meal  . ONETOUCH DELICA LANCETS 46K MISC 1 each by Does not apply route daily. Test 1x per day and prn  Dx E11.9  . phenytoin (DILANTIN) 100 MG ER capsule Take 3 capsules (300 mg total) by mouth at bedtime.     1. Essential hypertension  No c/o chest pain, sob or headache. Does not check blood pressure at home. BP Readings from Last 3 Encounters:  12/08/16 (!) 151/97  10/08/16 130/86  09/26/16 134/80     2. Type 2 diabetes mellitus treated without insulin (HCC)  Last hgba1c was 6.9%. Does not check blood sugars every day . Denies any hypoglycemia  3. Neuralgia of right lower extremity  Denies any pain in feet, just some burning.  4. Seizures ([place])  Denies any recent seizure activity. Saw neurology  10/08/16. Has not had seizure in 22 years but they want her to continue her dilantin.  5. Hyperlipidemia with target LDL less than 100  Tries to avoid fried foods  6. Morbid obesity ([place])  No recent weight changes  7. Vitamin D deficiency  Is currently not taking any vitamin d supplements    New complaints: Hair loss in front on top  Social history: Works at [employer] 12 hour shifts.    Review of Systems  Constitutional: Negative for activity change and appetite change.  HENT: Negative.   Eyes:  Negative for pain.  Respiratory: Negative for shortness of breath.   Cardiovascular: Negative for chest pain, palpitations and leg swelling.  Gastrointestinal: Negative for abdominal pain.  Endocrine: Negative for polydipsia.  Genitourinary: Negative.   Skin: Negative for rash.  Neurological: Negative for dizziness, weakness and headaches.  Hematological: Does not bruise/bleed easily.  Psychiatric/Behavioral: Negative.   All other systems reviewed and are negative.      Objective:   Physical Exam  Constitutional: She is oriented to person, place, and time. She appears well-developed and well-nourished.  HENT:  Nose: Nose normal.  Mouth/Throat: Oropharynx is clear and moist.  Eyes: EOM are normal.  Neck: Trachea normal, normal range of motion and full passive range of motion without pain. Neck supple. No JVD present. Carotid bruit is not present. No thyromegaly present.  Cardiovascular: Normal rate, regular rhythm, normal heart sounds and intact distal pulses. Exam reveals no gallop and no friction rub.  No murmur heard. Pulmonary/Chest: Effort normal and breath sounds normal.  Abdominal: Soft. Bowel sounds are normal. She exhibits no distension and no mass. There is no tenderness.  Musculoskeletal: Normal range of motion.  Lymphadenopathy:    She has no cervical adenopathy.  Neurological: She is alert and oriented to person, place, and time. She has normal reflexes.  Skin: Skin is warm and  dry.  Psychiatric: She has a normal mood and affect. Her behavior is normal. Judgment and thought content normal.    BP 133/85   Pulse 78   Temp (!) 97.3 F (36.3 C) (Oral)   Ht _0  (1.626 m)   Wt 194 lb (88 kg)   LMP 01/23/2001 (Approximate)   BMI 33.30 kg/m   HJGBA1c 6.6% today     Assessment & Plan:  1. Essential hypertension Low sodium diet - CMP14+EGFR  2. Type 2 diabetes mellitus treated without insulin (HCC) Continue to waych carbsin diet - Bayer DCA Hb A1c Waived -  Microalbumin / creatinine urine ratio  3. Neuralgia of right lower extremity  4. Seizures ([place])  5. Hyperlipidemia with target LDL less than 100 Low fat diet - Lipid panel  6. Morbid obesity ([place]) Discussed diet and exercise for person with BMI >25 Will recheck weight in 3-6 months  7. Vitamin D deficiency - start back on vitamin d supplements  8. Hair loss - Thyroid Panel With TSH    Labs pending Health maintenance reviewed Diet and exercise encouraged Continue all meds Follow up  In 3 months- pap also   [name] Done, FNP

## 2017-03-11 LAB — CMP14+EGFR
ALBUMIN: 4.1 g/dL (ref 3.5–5.5)
ALK PHOS: 203 IU/L — AB (ref 39–117)
ALT: 8 IU/L (ref 0–32)
AST: 13 IU/L (ref 0–40)
Albumin/Globulin Ratio: 1.5 (ref 1.2–2.2)
BUN/Creatinine Ratio: 20 (ref 9–23)
BUN: 15 mg/dL (ref 6–24)
Bilirubin Total: <0.2 mg/dL (ref 0.0–1.2)
CALCIUM: 8.9 mg/dL (ref 8.7–10.2)
CO2: 25 mmol/L (ref 20–29)
Chloride: 103 mmol/L (ref 96–106)
Creatinine, Ser: 0.75 mg/dL (ref 0.57–1.00)
GFR calc Af Amer: 103 mL/min/{1.73_m2} (ref >59)
GFR, EST NON AFRICAN AMERICAN: 89 mL/min/{1.73_m2} (ref >59)
GLOBULIN, TOTAL: 2.8 g/dL (ref 1.5–4.5)
GLUCOSE: 150 mg/dL — AB (ref 65–99)
Potassium: 4.4 mmol/L (ref 3.5–5.2)
SODIUM: 140 mmol/L (ref 134–144)
Total Protein: 6.9 g/dL (ref 6.0–8.5)

## 2017-03-11 LAB — MICROALBUMIN / CREATININE URINE RATIO
Creatinine, Urine: 103.2 mg/dL
Microalb/Creat Ratio: 7.8 mg/g creat (ref 0.0–30.0)
Microalbumin, Urine: 8.1 ug/mL

## 2017-03-11 LAB — LIPID PANEL
CHOL/HDL RATIO: 3 ratio (ref 0.0–4.4)
CHOLESTEROL TOTAL: 158 mg/dL (ref 100–199)
HDL: 53 mg/dL (ref >39)
LDL CALC: 86 mg/dL (ref 0–99)
TRIGLYCERIDES: 93 mg/dL (ref 0–149)
VLDL CHOLESTEROL CAL: 19 mg/dL (ref 5–40)

## 2017-03-12 LAB — THYROID PANEL WITH TSH
FREE THYROXINE INDEX: 2.1 (ref 1.2–4.9)
T3 UPTAKE RATIO: 27 % (ref 24–39)
T4, Total: 7.7 ug/dL (ref 4.5–12.0)
TSH: 1.64 u[IU]/mL (ref 0.450–4.500)

## 2017-03-12 LAB — SPECIMEN STATUS REPORT

## 2017-04-02 ENCOUNTER — Telehealth: Payer: Self-pay | Admitting: Nurse Practitioner

## 2017-04-03 NOTE — Telephone Encounter (Signed)
Faxed labs to [facility]

## 2017-05-07 ENCOUNTER — Telehealth: Payer: Self-pay | Admitting: Nurse Practitioner

## 2017-05-07 NOTE — Telephone Encounter (Signed)
Sent to [facility]

## 2017-06-09 ENCOUNTER — Encounter: Payer: BLUE CROSS/BLUE SHIELD | Admitting: Nurse Practitioner

## 2017-06-18 ENCOUNTER — Ambulatory Visit (INDEPENDENT_AMBULATORY_CARE_PROVIDER_SITE_OTHER): Payer: BLUE CROSS/BLUE SHIELD | Admitting: Nurse Practitioner

## 2017-06-18 ENCOUNTER — Encounter: Payer: Self-pay | Admitting: Nurse Practitioner

## 2017-06-18 VITALS — BP 142/90 | HR 74 | Temp 97.3°F | Ht 64.0 in | Wt 190.0 lb

## 2017-06-18 DIAGNOSIS — Z0000 Encounter for general adult medical examination without abnormal findings: Secondary | ICD-10-CM

## 2017-06-18 DIAGNOSIS — H6122 Impacted cerumen, left ear: Secondary | ICD-10-CM

## 2017-06-18 DIAGNOSIS — Z01419 Encounter for gynecological examination (general) (routine) without abnormal findings: Secondary | ICD-10-CM | POA: Diagnosis not present

## 2017-06-18 DIAGNOSIS — E119 Type 2 diabetes mellitus without complications: Secondary | ICD-10-CM

## 2017-06-18 DIAGNOSIS — I10 Essential (primary) hypertension: Secondary | ICD-10-CM | POA: Diagnosis not present

## 2017-06-18 DIAGNOSIS — E559 Vitamin D deficiency, unspecified: Secondary | ICD-10-CM

## 2017-06-18 DIAGNOSIS — E785 Hyperlipidemia, unspecified: Secondary | ICD-10-CM | POA: Diagnosis not present

## 2017-06-18 DIAGNOSIS — R569 Unspecified convulsions: Secondary | ICD-10-CM

## 2017-06-18 LAB — BAYER DCA HB A1C WAIVED: HB A1C (BAYER DCA - WAIVED): 6.7 % (ref <7.0)

## 2017-06-18 LAB — URINALYSIS, COMPLETE
BILIRUBIN UA: NEGATIVE
Glucose, UA: NEGATIVE
KETONES UA: NEGATIVE
LEUKOCYTES UA: NEGATIVE
NITRITE UA: NEGATIVE
Protein, UA: NEGATIVE
SPEC GRAV UA: 1.02 (ref 1.005–1.030)
UUROB: 0.2 mg/dL (ref 0.2–1.0)
pH, UA: 6.5 (ref 5.0–7.5)

## 2017-06-18 LAB — MICROSCOPIC EXAMINATION
Epithelial Cells (non renal): >10 /hpf — AB (ref 0–10)
RENAL EPITHEL UA: NONE SEEN /HPF

## 2017-06-18 MED ORDER — PHENYTOIN SODIUM EXTENDED 100 MG PO CAPS: 300.0000 mg | ORAL_CAPSULE | Freq: Every day | ORAL | 3 refills | Status: DC

## 2017-06-18 MED ORDER — LOSARTAN POTASSIUM 100 MG PO TABS: 100.0000 mg | ORAL_TABLET | Freq: Every day | ORAL | 1 refills | Status: DC

## 2017-06-18 MED ORDER — AMLODIPINE BESYLATE 5 MG PO TABS: 5.0000 mg | ORAL_TABLET | Freq: Every day | ORAL | 1 refills | Status: DC

## 2017-06-18 MED ORDER — METFORMIN HCL ER 500 MG PO TB24: 500.0000 mg | ORAL_TABLET | Freq: Every evening | ORAL | 1 refills | Status: DC

## 2017-06-18 MED ORDER — ATORVASTATIN CALCIUM 40 MG PO TABS: 40.0000 mg | ORAL_TABLET | Freq: Every day | ORAL | 1 refills | Status: DC

## 2017-06-18 NOTE — Patient Instructions (Signed)
Diabetes Mellitus and Nutrition When you have diabetes (diabetes mellitus), it is very important to have healthy eating habits because your blood sugar (glucose) levels are greatly affected by what you eat and drink. Eating healthy foods in the appropriate amounts, at about the same times every day, can help you:  Control your blood glucose.  Lower your risk of heart disease.  Improve your blood pressure.  Reach or maintain a healthy weight.  Every person with diabetes is different, and each person has different needs for a meal plan. Your health care provider may recommend that you work with a diet and nutrition specialist (dietitian) to make a meal plan that is best for you. Your meal plan may vary depending on factors such as:  The calories you need.  The medicines you take.  Your weight.  Your blood glucose, blood pressure, and cholesterol levels.  Your activity level.  Other health conditions you have, such as heart or kidney disease.  How do carbohydrates affect me? Carbohydrates affect your blood glucose level more than any other type of food. Eating carbohydrates naturally increases the amount of glucose in your blood. Carbohydrate counting is a method for keeping track of how many carbohydrates you eat. Counting carbohydrates is important to keep your blood glucose at a healthy level, especially if you use insulin or take certain oral diabetes medicines. It is important to know how many carbohydrates you can safely have in each meal. This is different for every person. Your dietitian can help you calculate how many carbohydrates you should have at each meal and for snack. Foods that contain carbohydrates include:  Bread, cereal, rice, pasta, and crackers.  Potatoes and corn.  Peas, beans, and lentils.  Milk and yogurt.  Fruit and juice.  Desserts, such as cakes, cookies, ice cream, and candy.  How does alcohol affect me? Alcohol can cause a sudden decrease in blood  glucose (hypoglycemia), especially if you use insulin or take certain oral diabetes medicines. Hypoglycemia can be a life-threatening condition. Symptoms of hypoglycemia (sleepiness, dizziness, and confusion) are similar to symptoms of having too much alcohol. If your health care provider says that alcohol is safe for you, follow these guidelines:  Limit alcohol intake to no more than 1 drink per day for nonpregnant women and 2 drinks per day for men. One drink equals 12 oz of beer, 5 oz of wine, or 1 oz of hard liquor.  Do not drink on an empty stomach.  Keep yourself hydrated with water, diet soda, or unsweetened iced tea.  Keep in mind that regular soda, juice, and other mixers may contain a lot of sugar and must be counted as carbohydrates.  What are tips for following this plan? Reading food labels  Start by checking the serving size on the label. The amount of calories, carbohydrates, fats, and other nutrients listed on the label are based on one serving of the food. Many foods contain more than one serving per package.  Check the total grams (g) of carbohydrates in one serving. You can calculate the number of servings of carbohydrates in one serving by dividing the total carbohydrates by 15. For example, if a food has 30 g of total carbohydrates, it would be equal to 2 servings of carbohydrates.  Check the number of grams (g) of saturated and trans fats in one serving. Choose foods that have low or no amount of these fats.  Check the number of milligrams (mg) of sodium in one serving. Most people   should limit total sodium intake to less than 2,300 mg per day.  Always check the nutrition information of foods labeled as "low-fat" or "nonfat". These foods may be higher in added sugar or refined carbohydrates and should be avoided.  Talk to your dietitian to identify your daily goals for nutrients listed on the label. Shopping  Avoid buying canned, premade, or processed foods. These  foods tend to be high in fat, sodium, and added sugar.  Shop around the outside edge of the grocery store. This includes fresh fruits and vegetables, bulk grains, fresh meats, and fresh dairy. Cooking  Use low-heat cooking methods, such as baking, instead of high-heat cooking methods like deep frying.  Cook using healthy oils, such as olive, canola, or sunflower oil.  Avoid cooking with butter, cream, or high-fat meats. Meal planning  Eat meals and snacks regularly, preferably at the same times every day. Avoid going long periods of time without eating.  Eat foods high in fiber, such as fresh fruits, vegetables, beans, and whole grains. Talk to your dietitian about how many servings of carbohydrates you can eat at each meal.  Eat 4-6 ounces of lean protein each day, such as lean meat, chicken, fish, eggs, or tofu. 1 ounce is equal to 1 ounce of meat, chicken, or fish, 1 egg, or 1/4 cup of tofu.  Eat some foods each day that contain healthy fats, such as avocado, nuts, seeds, and fish. Lifestyle   Check your blood glucose regularly.  Exercise at least 30 minutes 5 or more days each week, or as told by your health care provider.  Take medicines as told by your health care provider.  Do not use any products that contain nicotine or tobacco, such as cigarettes and e-cigarettes. If you need help quitting, ask your health care provider.  Work with a counselor or diabetes educator to identify strategies to manage stress and any emotional and social challenges. What are some questions to ask my health care provider?  Do I need to meet with a diabetes educator?  Do I need to meet with a dietitian?  What number can I call if I have questions?  When are the best times to check my blood glucose? Where to find more information:  American Diabetes Association: diabetes.org/food-and-fitness/food  Academy of Nutrition and Dietetics:  www.eatright.org/resources/health/diseases-and-conditions/diabetes  National Institute of Diabetes and Digestive and Kidney Diseases (NIH): www.niddk.nih.gov/health-information/diabetes/overview/diet-eating-physical-activity Summary  A healthy meal plan will help you control your blood glucose and maintain a healthy lifestyle.  Working with a diet and nutrition specialist (dietitian) can help you make a meal plan that is best for you.  Keep in mind that carbohydrates and alcohol have immediate effects on your blood glucose levels. It is important to count carbohydrates and to use alcohol carefully. This information is not intended to replace advice given to you by your health care provider. Make sure you discuss any questions you have with your health care provider. Document Released: 12/05/2004 Document Revised: 04/14/2016 Document Reviewed: 04/14/2016 Elsevier Interactive Patient Education  2018 Elsevier Inc.  

## 2017-06-18 NOTE — Progress Notes (Signed)
 Subjective:    Patient ID: Melanie Maynard, female    DOB: 03/02/1961, 56 y.o.   MRN: [id number]  HPI  Melanie Maynard is here today for annua; physical exam, PAP and  follow up of chronic medical problem.  Outpatient Encounter Medications as of 06/18/2017  Medication Sig  . amLODipine (NORVASC) 5 MG tablet Take 1 tablet (5 mg total) by mouth daily.  . atorvastatin (LIPITOR) 40 MG tablet Take 1 tablet (40 mg total) by mouth daily.  . glucose blood (ONETOUCH VERIO) test strip Test 1x per day and prn  Dx e11.9  . losartan (COZAAR) 100 MG tablet Take 1 tablet (100 mg total) by mouth daily.  . metFORMIN (GLUCOPHAGE XR) 500 MG 24 hr tablet Take 1 tablet (500 mg total) by mouth every evening. With your evening meal  . ONETOUCH DELICA LANCETS 33G MISC 1 each by Does not apply route daily. Test 1x per day and prn  Dx E11.9  . phenytoin (DILANTIN) 100 MG ER capsule Take 3 capsules (300 mg total) by mouth at bedtime.     1. Type 2 diabetes mellitus treated without insulin (HCC)  Last HGBA1c was 6.6%. She does not check blood sugars every day. Denies any hypoglycemic symptoms.  2. Hyperlipidemia with target LDL less than 100  Does not watch diet  3. Essential hypertension  No c/o chest pain, sob or headache. Does not check blood pressure at home. BP Readings from Last 3 Encounters:  06/18/17 (!) 142/90  03/10/17 133/85  12/08/16 (!) 151/97     4. Annual physical exam   5. Morbid obesity (HCC)  No recent weight changes  6. Seizures (HCC)  no recent seizure activity  7. Vitamin D deficiency  Currently not taking vitamin d     New complaints: None today  Social history: still working 12 hour shifts at [employer]    Review of Systems  Constitutional: Negative for activity change and appetite change.  HENT: Negative.   Eyes: Negative for pain.  Respiratory: Negative for shortness of breath.   Cardiovascular: Negative for chest pain, palpitations and leg swelling.    Gastrointestinal: Negative for abdominal pain.  Endocrine: Negative for polydipsia.  Genitourinary: Negative.   Skin: Negative for rash.  Neurological: Negative for dizziness, weakness and headaches.  Hematological: Does not bruise/bleed easily.  Psychiatric/Behavioral: Negative.   All other systems reviewed and are negative.      Objective:   Physical Exam  Constitutional: She is oriented to person, place, and time. She appears well-developed and well-nourished.  HENT:  Right Ear: External ear normal.  Left Ear: External ear normal.  Nose: Nose normal.  Mouth/Throat: Oropharynx is clear and moist.  Left cerumen impaction  Eyes: EOM are normal.  Neck: Trachea normal, normal range of motion and full passive range of motion without pain. Neck supple. No JVD present. Carotid bruit is not present. No thyromegaly present.  Cardiovascular: Normal rate, regular rhythm, normal heart sounds and intact distal pulses. Exam reveals no gallop and no friction rub.  No murmur heard. Pulmonary/Chest: Effort normal and breath sounds normal.  Abdominal: Soft. Bowel sounds are normal. She exhibits no distension and no mass. There is no tenderness.  Musculoskeletal: Normal range of motion.  Lymphadenopathy:    She has no cervical adenopathy.  Neurological: She is alert and oriented to person, place, and time. She has normal reflexes.  Skin: Skin is warm and dry.  Psychiatric: She has a normal mood and affect. Her   behavior is normal. Judgment and thought content normal.   BP (!) 142/90   Pulse 74   Temp (!) 97.3 F (36.3 C) (Oral)   Ht 5' 4" (1.626 m)   Wt 190 lb (86.2 kg)   LMP 01/23/2001 (Approximate)   BMI 32.61 kg/m   HGBA1c 6.7%  S/p left ear irrigation and curretting- TM normal    Assessment & Plan:  1. Type 2 diabetes mellitus treated without insulin (HCC) Continue to watch carbs in diet - Bayer DCA Hb A1c Waived - metFORMIN (GLUCOPHAGE XR) 500 MG 24 hr tablet; Take 1 tablet  (500 mg total) by mouth every evening. With your evening meal  Dispense: 90 tablet; Refill: 1  2. Hyperlipidemia with target LDL less than 100 Low fat diet - Lipid panel - atorvastatin (LIPITOR) 40 MG tablet; Take 1 tablet (40 mg total) by mouth daily.  Dispense: 90 tablet; Refill: 1  3. Essential hypertension Low sodium diet - CMP14+EGFR - amLODipine (NORVASC) 5 MG tablet; Take 1 tablet (5 mg total) by mouth daily.  Dispense: 90 tablet; Refill: 1 - losartan (COZAAR) 100 MG tablet; Take 1 tablet (100 mg total) by mouth daily.  Dispense: 90 tablet; Refill: 1  4. Annual physical exam - Urinalysis, Complete - CBC with Differential/Platelet - Thyroid Panel With TSH  5. Morbid obesity ([place]) Discussed diet and exercise for person with BMI >25 Will recheck weight in 3-6 months   6. Seizures ([place]) Keep follow up with neurology - phenytoin (DILANTIN) 100 MG ER capsule; Take 3 capsules (300 mg total) by mouth at bedtime.  Dispense: 270 capsule; Refill: 3  7. Vitamin D deficiency Vitamin d OTC daily  8. Gynecologic exam normal - IGP, Aptima HPV, rfx 16/18,45   Will schedule mammogram cologard ordered- will come to your house Labs pending Health maintenance reviewed Diet and exercise encouraged Continue all meds Follow up  In 3 months   [place], FNP

## 2017-06-19 LAB — CBC WITH DIFFERENTIAL/PLATELET
BASOS ABS: 0 10*3/uL (ref 0.0–0.2)
Basos: 0 %
EOS (ABSOLUTE): 0.5 10*3/uL — AB (ref 0.0–0.4)
Eos: 5 %
Hematocrit: 39.7 % (ref 34.0–46.6)
Hemoglobin: 13.3 g/dL (ref 11.1–15.9)
IMMATURE GRANS (ABS): 0 10*3/uL (ref 0.0–0.1)
Immature Granulocytes: 0 %
LYMPHS: 34 %
Lymphocytes Absolute: 3.4 10*3/uL — ABNORMAL HIGH (ref 0.7–3.1)
MCH: 25.9 pg — AB (ref 26.6–33.0)
MCHC: 33.5 g/dL (ref 31.5–35.7)
MCV: 77 fL — ABNORMAL LOW (ref 79–97)
Monocytes Absolute: 0.5 10*3/uL (ref 0.1–0.9)
Monocytes: 5 %
Neutrophils Absolute: 5.6 10*3/uL (ref 1.4–7.0)
Neutrophils: 56 %
PLATELETS: 230 10*3/uL (ref 150–379)
RBC: 5.14 x10E6/uL (ref 3.77–5.28)
RDW: 14.7 % (ref 12.3–15.4)
WBC: 10 10*3/uL (ref 3.4–10.8)

## 2017-06-19 LAB — CMP14+EGFR
ALBUMIN: 3.9 g/dL (ref 3.5–5.5)
ALK PHOS: 210 IU/L — AB (ref 39–117)
ALT: 8 IU/L (ref 0–32)
AST: 11 IU/L (ref 0–40)
Albumin/Globulin Ratio: 1.6 (ref 1.2–2.2)
BUN / CREAT RATIO: 16 (ref 9–23)
BUN: 14 mg/dL (ref 6–24)
CHLORIDE: 104 mmol/L (ref 96–106)
CO2: 23 mmol/L (ref 20–29)
Calcium: 8.8 mg/dL (ref 8.7–10.2)
Creatinine, Ser: 0.85 mg/dL (ref 0.57–1.00)
GFR calc Af Amer: 89 mL/min/{1.73_m2} (ref >59)
GFR calc non Af Amer: 77 mL/min/{1.73_m2} (ref >59)
GLOBULIN, TOTAL: 2.5 g/dL (ref 1.5–4.5)
Glucose: 134 mg/dL — ABNORMAL HIGH (ref 65–99)
POTASSIUM: 4 mmol/L (ref 3.5–5.2)
SODIUM: 139 mmol/L (ref 134–144)
Total Protein: 6.4 g/dL (ref 6.0–8.5)

## 2017-06-19 LAB — LIPID PANEL
CHOLESTEROL TOTAL: 138 mg/dL (ref 100–199)
Chol/HDL Ratio: 2.8 ratio (ref 0.0–4.4)
HDL: 49 mg/dL (ref >39)
LDL Calculated: 70 mg/dL (ref 0–99)
Triglycerides: 96 mg/dL (ref 0–149)
VLDL Cholesterol Cal: 19 mg/dL (ref 5–40)

## 2017-06-19 LAB — THYROID PANEL WITH TSH
FREE THYROXINE INDEX: 2.2 (ref 1.2–4.9)
T3 Uptake Ratio: 29 % (ref 24–39)
T4, Total: 7.5 ug/dL (ref 4.5–12.0)
TSH: 2.37 u[IU]/mL (ref 0.450–4.500)

## 2017-06-20 LAB — IGP, APTIMA HPV, RFX 16/18,45
HPV APTIMA: NEGATIVE
PAP Smear Comment: 0

## 2017-07-23 DIAGNOSIS — Z1212 Encounter for screening for malignant neoplasm of rectum: Secondary | ICD-10-CM | POA: Diagnosis not present

## 2017-07-23 DIAGNOSIS — Z1211 Encounter for screening for malignant neoplasm of colon: Secondary | ICD-10-CM | POA: Diagnosis not present

## 2017-08-07 LAB — COLOGUARD: Cologuard: POSITIVE

## 2017-09-23 ENCOUNTER — Ambulatory Visit: Payer: BLUE CROSS/BLUE SHIELD | Admitting: Nurse Practitioner

## 2017-10-07 NOTE — Progress Notes (Signed)
[facility]  PATIENT: Melanie Maynard DOB: 08/23/1960   REASON FOR VISIT: Follow-up for seizure disorder HISTORY FROM: Patient    HISTORY OF PRESENT ILLNESS:Melanie Maynard, 57 year old black female returns for  yearly followup. She has a history of seizure disorder, last seizure was approximately 23 years ago. She is currently on Dilantin without side effects. She denies daytime drowsiness or feelings of being off balance. She has had no evidence of bowel or bladder incontinence, no tongue biting, no jerking of extremities. Sleeping well, appetite is reportedly good. Medical history also includes hypertension and elevated cholesterol. Blood pressure  In the office today 138/96.Recent labs at primary care in March were reviewed CBC and CMP.  She returns for reevaluation and refills   REVIEW OF SYSTEMS: Full 14 system review of systems performed and notable only for those listed, all others are neg:  Constitutional: neg  Cardiovascular: neg Ear/Nose/Throat: neg  Skin: neg Eyes: neg Respiratory: neg Gastroitestinal: neg  Hematology/Lymphatic: neg  Endocrine: neg Musculoskeletal:neg Allergy/Immunology: neg Neurological: History of seizure disorder Psychiatric: neg Sleep : neg   ALLERGIES: Allergies  Allergen Reactions  . Gabapentin Other (See Comments)    Chest pain    HOME MEDICATIONS: Outpatient Medications Prior to Visit  Medication Sig Dispense Refill  . amLODipine (NORVASC) 5 MG tablet Take 1 tablet (5 mg total) by mouth daily. 90 tablet 1  . atorvastatin (LIPITOR) 40 MG tablet Take 1 tablet (40 mg total) by mouth daily. 90 tablet 1  . glucose blood (ONETOUCH VERIO) test strip Test 1x per day and prn  Dx e11.9 100 each 12  . losartan (COZAAR) 100 MG tablet Take 1 tablet (100 mg total) by mouth daily. 90 tablet 1  . metFORMIN (GLUCOPHAGE XR) 500 MG 24 hr tablet Take 1 tablet (500 mg total) by mouth every evening. With your evening meal 90 tablet 1  .  ONETOUCH DELICA LANCETS 24M MISC 1 each by Does not apply route daily. Test 1x per day and prn  Dx E11.9 100 each 3  . phenytoin (DILANTIN) 100 MG ER capsule Take 3 capsules (300 mg total) by mouth at bedtime. 270 capsule 3   No facility-administered medications prior to visit.     PAST MEDICAL HISTORY: Past Medical History:  Diagnosis Date  . Diabetes mellitus without complication ([place])   . Hypertension   . Seizures ([place])     PAST SURGICAL HISTORY: Past Surgical History:  Procedure Laterality Date  . ABDOMINAL HYSTERECTOMY    . APPENDECTOMY    . CATARACT EXTRACTION W/PHACO Right 09/26/2016   Procedure: CATARACT EXTRACTION PHACO AND INTRAOCULAR LENS PLACEMENT (IOC);  Surgeon: Baruch Goldmann, MD;  Location: [facility];  Service: Ophthalmology;  Laterality: Right;  CDE: 3.16  . CHOLECYSTECTOMY      FAMILY HISTORY: Family History  Problem Relation Age of Onset  . Diabetes Mother   . Heart attack Mother 96  . Healthy Father   . Alcohol abuse Father   . Diabetes Sister   . Cancer Sister        breast  . Cirrhosis Brother   . Leukemia Sister   . Diabetes Sister   . Kidney disease Sister        on dialysis    SOCIAL HISTORY: Social History   Socioeconomic History  . Marital status: Significant Other    Spouse name: Not on file  . Number of children: 0  . Years of education: 69  . Highest education level: Not on  file  Occupational History    Employer: [employer]  . Financial resource strain: Not on file  . Food insecurity:    Worry: Not on file    Inability: Not on file  . Transportation needs:    Medical: Not on file    Non-medical: Not on file  Tobacco Use  . Smoking status: Current Every Day Smoker    Packs/day: 1.00    Types: Cigarettes  . Smokeless tobacco: Never Used  Substance and Sexual Activity  . Alcohol use: No    Alcohol/week: 0.0 oz  . Drug use: No  . Sexual activity: Not on file  Lifestyle  . Physical activity:    Days per  week: Not on file    Minutes per session: Not on file  . Stress: Not on file  Relationships  . Social connections:    Talks on phone: Not on file    Gets together: Not on file    Attends religious service: Not on file    Active member of club or organization: Not on file    Attends meetings of clubs or organizations: Not on file    Relationship status: Not on file  . Intimate partner violence:    Fear of current or ex partner: Not on file    Emotionally abused: Not on file    Physically abused: Not on file    Forced sexual activity: Not on file  Other Topics Concern  . Not on file  Social History Narrative   Patient is single with no children   Patient is right handed   Patient has a high school education   Patient drinks 2-3 cups daily     PHYSICAL EXAM  Vitals:   10/08/17 0730  BP: (!) 138/96  Pulse: 80  Weight: 185 lb 8 oz (84.1 kg)  Height: 5\' 4"  (1.626 m)   Body mass index is 31.84 kg/m. General: well developed, well nourished, seated, in no evident distress  Head: head normocephalic and atraumatic. Oropharynx benign  Neck: supple  Neurologic Exam  Mental Status: Awake and fully alert. Oriented to place and time. Follows all commands. Speech and language normal.  Cranial Nerves: Pupils equal, briskly reactive to light. Extraocular movements full without nystagmus. Visual fields full to confrontation. Hearing intact and symmetric to finger snap. Facial sensation intact. Face, tongue, palate move normally and symmetrically. Neck flexion and extension normal.  Motor: Normal bulk and tone. Normal strength in all tested extremity muscles.No focal weakness  Coordination: Rapid alternating movements normal in all extremities. Finger-to-nose and heel-to-shin performed accurately bilaterally. No dysmetria  Gait and Station: Arises from chair without difficulty. Stance is normal. Gait demonstrates normal stride length and balance . Able to heel, toe and tandem walk  without difficulty. No assistive device Reflexes: 1+ and symmetric. Toes downgoing.    DIAGNOSTIC DATA (LABS, IMAGING, TESTING) - I reviewed patient records, labs, notes, testing and imaging myself where available.      Component Value Date/Time   NA 139 06/18/2017 1019   K 4.0 06/18/2017 1019   CL 104 06/18/2017 1019   CO2 23 06/18/2017 1019   GLUCOSE 134 (H) 06/18/2017 1019   GLUCOSE 149 (H) 09/22/2016 0801   BUN 14 06/18/2017 1019   CREATININE 0.85 06/18/2017 1019   CREATININE 0.77 09/22/2012 1213   CALCIUM 8.8 06/18/2017 1019   PROT 6.4 06/18/2017 1019   ALBUMIN 3.9 06/18/2017 1019   AST 11 06/18/2017 1019   ALT 8  06/18/2017 1019   ALKPHOS 210 (H) 06/18/2017 1019   BILITOT <0.2 06/18/2017 1019   GFRNONAA 77 06/18/2017 1019   GFRNONAA 89 09/22/2012 1213   GFRAA 89 06/18/2017 1019   GFRAA >89 09/22/2012 1213   Lab Results  Component Value Date   CHOL 138 06/18/2017   HDL 49 06/18/2017   LDLCALC 70 06/18/2017   TRIG 96 06/18/2017   CHOLHDL 2.8 06/18/2017   Lab Results  Component Value Date   HGBA1C 7.3 04/24/2015    ASSESSMENT AND PLAN 57y.o. year old female  has a past medical history of Hypertension and Seizures here to follow-up. No seizure activity in 23 years. Currently on Dilantin 300 daily wihout side effects.  Continue Dilantin at current dose will refill 3 month with 3 refills Call for any seizure activity  Reviewed recent CBC , CMP done on 06/18/17 from primary care for adverse side effects to Dilantin  Follow-up yearly and when necessary Dennie Bible, [facility], [facility], [facility] [address], [place], [place] [address] [phone]

## 2017-10-08 ENCOUNTER — Encounter: Payer: Self-pay | Admitting: Nurse Practitioner

## 2017-10-08 ENCOUNTER — Ambulatory Visit: Payer: BLUE CROSS/BLUE SHIELD | Admitting: Nurse Practitioner

## 2017-10-08 VITALS — BP 138/96 | HR 80 | Ht 64.0 in | Wt 185.5 lb

## 2017-10-08 DIAGNOSIS — R569 Unspecified convulsions: Secondary | ICD-10-CM

## 2017-10-08 MED ORDER — PHENYTOIN SODIUM EXTENDED 100 MG PO CAPS: 300.0000 mg | ORAL_CAPSULE | Freq: Every day | ORAL | 3 refills | Status: DC

## 2017-10-08 NOTE — Patient Instructions (Signed)
Continue Dilantin at current dose will refill 3 month with 3 refills Call for any seizure activity  Reviewed recent CBC , CMP done on 06/18/17 from primary care for adverse side effects to Dilantin  Follow-up yearly and when necessary

## 2017-10-16 ENCOUNTER — Ambulatory Visit (INDEPENDENT_AMBULATORY_CARE_PROVIDER_SITE_OTHER): Payer: BLUE CROSS/BLUE SHIELD | Admitting: Nurse Practitioner

## 2017-10-16 ENCOUNTER — Encounter: Payer: Self-pay | Admitting: Nurse Practitioner

## 2017-10-16 VITALS — BP 143/89 | HR 70 | Temp 97.4°F | Ht 64.0 in | Wt 187.0 lb

## 2017-10-16 DIAGNOSIS — E119 Type 2 diabetes mellitus without complications: Secondary | ICD-10-CM | POA: Diagnosis not present

## 2017-10-16 DIAGNOSIS — M792 Neuralgia and neuritis, unspecified: Secondary | ICD-10-CM

## 2017-10-16 DIAGNOSIS — I10 Essential (primary) hypertension: Secondary | ICD-10-CM

## 2017-10-16 DIAGNOSIS — Z1231 Encounter for screening mammogram for malignant neoplasm of breast: Secondary | ICD-10-CM

## 2017-10-16 DIAGNOSIS — E559 Vitamin D deficiency, unspecified: Secondary | ICD-10-CM

## 2017-10-16 DIAGNOSIS — E785 Hyperlipidemia, unspecified: Secondary | ICD-10-CM | POA: Diagnosis not present

## 2017-10-16 DIAGNOSIS — R569 Unspecified convulsions: Secondary | ICD-10-CM

## 2017-10-16 LAB — CMP14+EGFR
A/G RATIO: 1.4 (ref 1.2–2.2)
ALT: 9 IU/L (ref 0–32)
AST: 14 IU/L (ref 0–40)
Albumin: 4 g/dL (ref 3.5–5.5)
Alkaline Phosphatase: 228 IU/L — ABNORMAL HIGH (ref 39–117)
BILIRUBIN TOTAL: 0.2 mg/dL (ref 0.0–1.2)
BUN/Creatinine Ratio: 15 (ref 9–23)
BUN: 15 mg/dL (ref 6–24)
CHLORIDE: 102 mmol/L (ref 96–106)
CO2: 25 mmol/L (ref 20–29)
Calcium: 9 mg/dL (ref 8.7–10.2)
Creatinine, Ser: 0.97 mg/dL (ref 0.57–1.00)
GFR calc non Af Amer: 65 mL/min/{1.73_m2} (ref >59)
GFR, EST AFRICAN AMERICAN: 75 mL/min/{1.73_m2} (ref >59)
GLOBULIN, TOTAL: 2.9 g/dL (ref 1.5–4.5)
Glucose: 97 mg/dL (ref 65–99)
POTASSIUM: 4.4 mmol/L (ref 3.5–5.2)
SODIUM: 139 mmol/L (ref 134–144)
TOTAL PROTEIN: 6.9 g/dL (ref 6.0–8.5)

## 2017-10-16 LAB — LIPID PANEL
Chol/HDL Ratio: 2.9 ratio (ref 0.0–4.4)
Cholesterol, Total: 142 mg/dL (ref 100–199)
HDL: 49 mg/dL (ref >39)
LDL Calculated: 78 mg/dL (ref 0–99)
Triglycerides: 77 mg/dL (ref 0–149)
VLDL Cholesterol Cal: 15 mg/dL (ref 5–40)

## 2017-10-16 LAB — BAYER DCA HB A1C WAIVED: HB A1C: 6.4 % (ref <7.0)

## 2017-10-16 MED ORDER — LOSARTAN POTASSIUM 100 MG PO TABS: 100.0000 mg | ORAL_TABLET | Freq: Every day | ORAL | 1 refills | Status: DC

## 2017-10-16 MED ORDER — METFORMIN HCL ER 500 MG PO TB24: 500.0000 mg | ORAL_TABLET | Freq: Every evening | ORAL | 1 refills | Status: DC

## 2017-10-16 MED ORDER — AMLODIPINE BESYLATE 5 MG PO TABS: 5.0000 mg | ORAL_TABLET | Freq: Every day | ORAL | 1 refills | Status: DC

## 2017-10-16 MED ORDER — VITAMIN D 50 MCG (2000 UT) PO CAPS: 1.0000 | ORAL_CAPSULE | Freq: Every day | ORAL | 3 refills | Status: DC

## 2017-10-16 MED ORDER — ATORVASTATIN CALCIUM 40 MG PO TABS: 40.0000 mg | ORAL_TABLET | Freq: Every day | ORAL | 1 refills | Status: DC

## 2017-10-16 NOTE — Patient Instructions (Signed)
Vitamin D Deficiency Vitamin D deficiency is when your body does not have enough vitamin D. Vitamin D is important because:  It helps your body use other minerals that your body needs.  It helps keep your bones strong and healthy.  It may help to prevent some diseases.  It helps your heart and other muscles work well.  You can get vitamin D by:  Eating foods with vitamin D in them.  Drinking or eating milk or other foods that have had vitamin D added to them.  Taking a vitamin D supplement.  Being in the sun.  Not getting enough vitamin D can make your bones become soft. It can also cause other health problems. Follow these instructions at home:  Take medicines and supplements only as told by your doctor.  Eat foods that have vitamin D. These include: ? Dairy products, cereals, or juices with added vitamin D. Check the label for vitamin D. ? Fatty fish like salmon or trout. ? Eggs. ? Oysters.  Do not use tanning beds.  Stay at a healthy weight. Lose weight, if needed.  Keep all follow-up visits as told by your doctor. This is important. Contact a doctor if:  Your symptoms do not go away.  You feel sick to your stomach (nauseous).  Youthrow up (vomit).  You poop less often than usual or you have trouble pooping (constipation). This information is not intended to replace advice given to you by your health care provider. Make sure you discuss any questions you have with your health care provider. Document Released: 02/27/2011 Document Revised: 08/16/2015 Document Reviewed: 07/26/2014 Elsevier Interactive Patient Education  2018 Elsevier Inc.  

## 2017-10-16 NOTE — Progress Notes (Signed)
Subjective:    Patient ID: Melanie Maynard, female    DOB: 10/15/1960, 57 y.o.   MRN: [id number]   Chief Complaint: Medical Management of Chronic issues  HPI:  1. Type 2 diabetes mellitus treated without insulin (HCC)  Last hgba1c was 6.7%. Does not check blood sugars at home. Denies any symptoms of hypoglycemia.  2. Hyperlipidemia with target LDL less than 100  She does not watch her diet at all and does very little exercise.  3. Essential hypertension  No c/o chest pain, sob or headache. Does not check blood pressure at home. BP Readings from Last 3 Encounters:  10/08/17 (!) 138/96  06/18/17 (!) 142/90  03/10/17 133/85     4. Vitamin D deficiency  Not taking any vitamin d  5. Seizures [facility])  Saw neurologist several months ago according to patient. She has had no recent seizure activity. She is currently on dilantin.  6. Neuralgia of right lower extremity is only at night when she gets off work. Does not have when she is not working.  7. Morbid obesity ([place])  No recent weight changes    Outpatient Encounter Medications as of 10/16/2017  Medication Sig  . amLODipine (NORVASC) 5 MG tablet Take 1 tablet (5 mg total) by mouth daily.  Marland Kitchen atorvastatin (LIPITOR) 40 MG tablet Take 1 tablet (40 mg total) by mouth daily.  Marland Kitchen glucose blood (ONETOUCH VERIO) test strip Test 1x per day and prn  Dx e11.9  . losartan (COZAAR) 100 MG tablet Take 1 tablet (100 mg total) by mouth daily.  . metFORMIN (GLUCOPHAGE XR) 500 MG 24 hr tablet Take 1 tablet (500 mg total) by mouth every evening. With your evening meal  . ONETOUCH DELICA LANCETS 16L MISC 1 each by Does not apply route daily. Test 1x per day and prn  Dx E11.9  . phenytoin (DILANTIN) 100 MG ER capsule Take 3 capsules (300 mg total) by mouth at bedtime.      New complaints: Her cologuard that she did back in may came back positive but patient is refusing to see anyone about it.  Social history: Works at [employer]- 8 hour  shifts  Review of Systems  Constitutional: Negative for activity change and appetite change.  HENT: Negative.   Eyes: Negative for pain.  Respiratory: Negative for shortness of breath.   Cardiovascular: Negative for chest pain, palpitations and leg swelling.  Gastrointestinal: Negative for abdominal pain.  Endocrine: Negative for polydipsia.  Genitourinary: Negative.   Skin: Negative for rash.  Neurological: Negative for dizziness, weakness and headaches.  Hematological: Does not bruise/bleed easily.  Psychiatric/Behavioral: Negative.   All other systems reviewed and are negative.      Objective:   Physical Exam  Constitutional: She is oriented to person, place, and time. She appears well-developed and well-nourished. No distress.  HENT:  Head: Normocephalic.  Nose: Nose normal.  Mouth/Throat: Oropharynx is clear and moist.  Eyes: Pupils are equal, round, and reactive to light. EOM are normal.  Neck: Normal range of motion. Neck supple. No JVD present. Carotid bruit is not present.  Cardiovascular: Normal rate, regular rhythm, normal heart sounds and intact distal pulses.  Pulmonary/Chest: Effort normal and breath sounds normal. No respiratory distress. She has no wheezes. She has no rales. She exhibits no tenderness.  Abdominal: Soft. Normal appearance, normal aorta and bowel sounds are normal. She exhibits no distension, no abdominal bruit, no pulsatile midline mass and no mass. There is no splenomegaly or hepatomegaly. There  is no tenderness.  Musculoskeletal: Normal range of motion. She exhibits no edema.  Lymphadenopathy:    She has no cervical adenopathy.  Neurological: She is alert and oriented to person, place, and time. She has normal reflexes.  Skin: Skin is warm and dry.  Psychiatric: She has a normal mood and affect. Her behavior is normal. Judgment and thought content normal.  Nursing note and vitals reviewed.  BP (!) 143/89   Pulse 70   Temp (!) 97.4 F (36.3  C) (Oral)   Ht _0  (1.626 m)   Wt 187 lb (84.8 kg)   LMP 01/23/2001 (Approximate)   BMI 32.10 kg/m   hgba1c 6.4%       Assessment & Plan:  Melanie Maynard comes in today with chief complaint of Medical Management of Chronic Issues   Diagnosis and orders addressed:  1. Type 2 diabetes mellitus treated without insulin (HCC) Continue to watch carbsin diet - Bayer DCA Hb A1c Waived - metFORMIN (GLUCOPHAGE XR) 500 MG 24 hr tablet; Take 1 tablet (500 mg total) by mouth every evening. With your evening meal  Dispense: 90 tablet; Refill: 1  2. Hyperlipidemia with target LDL less than 100 Low fatr diet - Lipid panel - atorvastatin (LIPITOR) 40 MG tablet; Take 1 tablet (40 mg total) by mouth daily.  Dispense: 90 tablet; Refill: 1  3. Essential hypertension Low sodium diet - CMP14+EGFR - amLODipine (NORVASC) 5 MG tablet; Take 1 tablet (5 mg total) by mouth daily.  Dispense: 90 tablet; Refill: 1 - losartan (COZAAR) 100 MG tablet; Take 1 tablet (100 mg total) by mouth daily.  Dispense: 90 tablet; Refill: 1  4. Vitamin D deficiency Take calcium supplement over the counter so can absorb the vitamin d - Cholecalciferol (VITAMIN D) 2000 units CAPS; Take 1 capsule (2,000 Units total) by mouth daily.  Dispense: 100 capsule; Refill: 3  5. Seizures ([place]) Keep followup with neurology  6. Neuralgia of right lower extremity Do nt go barefooted  7. Morbid obesity ([place]) Discussed diet and exercise for person with BMI >25 Will recheck weight in 3-6 months  8. Screening mammogram, encounter for - MM 3D SCREEN BREAST BILATERAL; Future   Labs pending Health Maintenance reviewed Diet and exercise encouraged  Follow up plan: 3 months   Melanie Hassell Done, FNP

## 2017-11-11 DIAGNOSIS — H5213 Myopia, bilateral: Secondary | ICD-10-CM | POA: Diagnosis not present

## 2017-11-11 DIAGNOSIS — H43393 Other vitreous opacities, bilateral: Secondary | ICD-10-CM | POA: Diagnosis not present

## 2017-11-11 DIAGNOSIS — H524 Presbyopia: Secondary | ICD-10-CM | POA: Diagnosis not present

## 2017-11-11 DIAGNOSIS — H5203 Hypermetropia, bilateral: Secondary | ICD-10-CM | POA: Diagnosis not present

## 2017-11-11 DIAGNOSIS — H52222 Regular astigmatism, left eye: Secondary | ICD-10-CM | POA: Diagnosis not present

## 2018-01-21 ENCOUNTER — Ambulatory Visit: Payer: BLUE CROSS/BLUE SHIELD | Admitting: Nurse Practitioner

## 2018-02-02 ENCOUNTER — Ambulatory Visit: Payer: BLUE CROSS/BLUE SHIELD | Admitting: Nurse Practitioner

## 2018-02-02 ENCOUNTER — Encounter: Payer: Self-pay | Admitting: Nurse Practitioner

## 2018-02-02 VITALS — BP 131/89 | HR 79 | Temp 97.3°F | Ht 64.0 in | Wt 184.0 lb

## 2018-02-02 DIAGNOSIS — E559 Vitamin D deficiency, unspecified: Secondary | ICD-10-CM

## 2018-02-02 DIAGNOSIS — E785 Hyperlipidemia, unspecified: Secondary | ICD-10-CM

## 2018-02-02 DIAGNOSIS — R569 Unspecified convulsions: Secondary | ICD-10-CM | POA: Diagnosis not present

## 2018-02-02 DIAGNOSIS — M79622 Pain in left upper arm: Secondary | ICD-10-CM

## 2018-02-02 DIAGNOSIS — R059 Cough, unspecified: Secondary | ICD-10-CM

## 2018-02-02 DIAGNOSIS — R05 Cough: Secondary | ICD-10-CM

## 2018-02-02 DIAGNOSIS — I10 Essential (primary) hypertension: Secondary | ICD-10-CM

## 2018-02-02 DIAGNOSIS — E119 Type 2 diabetes mellitus without complications: Secondary | ICD-10-CM | POA: Diagnosis not present

## 2018-02-02 LAB — BAYER DCA HB A1C WAIVED: HB A1C (BAYER DCA - WAIVED): 6.4 % (ref <7.0)

## 2018-02-02 MED ORDER — ATORVASTATIN CALCIUM 40 MG PO TABS: 40.0000 mg | ORAL_TABLET | Freq: Every day | ORAL | 1 refills | Status: DC

## 2018-02-02 MED ORDER — DICLOFENAC SODIUM 1 % TD GEL: 2.0000 g | Freq: Two times a day (BID) | TRANSDERMAL | 2 refills | Status: DC

## 2018-02-02 MED ORDER — HYDROCODONE-HOMATROPINE 5-1.5 MG/5ML PO SYRP: 5.0000 mL | ORAL_SOLUTION | Freq: Four times a day (QID) | ORAL | 0 refills | Status: DC | PRN

## 2018-02-02 MED ORDER — LOSARTAN POTASSIUM 100 MG PO TABS: 100.0000 mg | ORAL_TABLET | Freq: Every day | ORAL | 1 refills | Status: DC

## 2018-02-02 MED ORDER — METFORMIN HCL ER 500 MG PO TB24: 500.0000 mg | ORAL_TABLET | Freq: Every evening | ORAL | 1 refills | Status: DC

## 2018-02-02 MED ORDER — AMLODIPINE BESYLATE 5 MG PO TABS: 5.0000 mg | ORAL_TABLET | Freq: Every day | ORAL | 1 refills | Status: DC

## 2018-02-02 NOTE — Progress Notes (Signed)
Subjective:    Patient ID: Melanie Maynard, female    DOB: [birth year], 57 y.o.   MRN: [id number]   Chief Complaint: medical management of chronic issues  HPI:  1. Type 2 diabetes mellitus treated without insulin (HCC)  Last hgba1c was 6.4%. She doe snot check blood sugars everyday but are below 140 when she does check them. No symptoms of hypoglycemia.  2. Hyperlipidemia with target LDL less than 100  Does not watch diet very closely and does little to no exercise  3. Essential hypertension  No c/o chest pain, sob or headache. Does not check blood pressure at home. BP Readings from Last 3 Encounters:  10/16/17 (!) 143/89  10/08/17 (!) 138/96  06/18/17 (!) 142/90     4. Seizures ([place])  Has had no seizure activity in several years  5. Morbid obesity ([place])  No recent weight changes  6. Vitamin D deficiency  Takes a daily vitamin d supplement    Outpatient Encounter Medications as of 02/02/2018  Medication Sig  . amLODipine (NORVASC) 5 MG tablet Take 1 tablet (5 mg total) by mouth daily.  Marland Kitchen atorvastatin (LIPITOR) 40 MG tablet Take 1 tablet (40 mg total) by mouth daily.  . Cholecalciferol (VITAMIN D) 2000 units CAPS Take 1 capsule (2,000 Units total) by mouth daily.  Marland Kitchen glucose blood (ONETOUCH VERIO) test strip Test 1x per day and prn  Dx e11.9  . losartan (COZAAR) 100 MG tablet Take 1 tablet (100 mg total) by mouth daily.  . metFORMIN (GLUCOPHAGE XR) 500 MG 24 hr tablet Take 1 tablet (500 mg total) by mouth every evening. With your evening meal  . ONETOUCH DELICA LANCETS 25Z MISC 1 each by Does not apply route daily. Test 1x per day and prn  Dx E11.9  . phenytoin (DILANTIN) 100 MG ER capsule Take 3 capsules (300 mg total) by mouth at bedtime.      New complaints: -Has a cough that she has had for over a week. Denies fever - left upper arm pain- started several weeks ago. Social history: Still working at [employer]  Constitutional: Negative for  activity change and appetite change.  HENT: Negative.   Eyes: Negative for pain.  Respiratory: Negative for shortness of breath.   Cardiovascular: Negative for chest pain, palpitations and leg swelling.  Gastrointestinal: Negative for abdominal pain.  Endocrine: Negative for polydipsia.  Genitourinary: Negative.   Skin: Negative for rash.  Neurological: Negative for dizziness, weakness and headaches.  Hematological: Does not bruise/bleed easily.  Psychiatric/Behavioral: Negative.   All other systems reviewed and are negative.      Objective:   Physical Exam  Constitutional: She is oriented to person, place, and time. She appears well-developed and well-nourished. No distress.  HENT:  Head: Normocephalic.  Nose: Nose normal.  Mouth/Throat: Oropharynx is clear and moist.  Eyes: Pupils are equal, round, and reactive to light. EOM are normal.  Neck: Normal range of motion. Neck supple. No JVD present. Carotid bruit is not present.  Cardiovascular: Normal rate, regular rhythm, normal heart sounds and intact distal pulses.  Pulmonary/Chest: Effort normal and breath sounds normal. No respiratory distress. She has no wheezes. She has no rales. She exhibits no tenderness.  Dry hacky cough  Abdominal: Soft. Normal appearance, normal aorta and bowel sounds are normal. She exhibits no distension, no abdominal bruit, no pulsatile midline mass and no mass. There is no splenomegaly or hepatomegaly. There is no tenderness.  Musculoskeletal: Normal  range of motion. She exhibits no edema.  FROM of eft shoulder without pain. tnder nodule left upper arm.  Lymphadenopathy:    She has no cervical adenopathy.  Neurological: She is alert and oriented to person, place, and time. She has normal reflexes.  Skin: Skin is warm and dry.  Psychiatric: She has a normal mood and affect. Her behavior is normal. Judgment and thought content normal.  Nursing note and vitals reviewed.  BP 131/89   Pulse 79    Temp (!) 97.3 F (36.3 C) (Oral)   Ht '5\' 4"'  (1.626 m)   Wt 184 lb (83.5 kg)   LMP 01/23/2001 (Approximate)   BMI 31.58 kg/m   hgba1c 6.4% today    Assessment & Plan:  Melanie Maynard comes in today with chief complaint of Medical Management of Chronic Issues (cough and left shoulder sore)   Diagnosis and orders addressed:  1. Type 2 diabetes mellitus treated without insulin (HCC) Continue to watch carbs in diet - Bayer DCA Hb A1c Waived - metFORMIN (GLUCOPHAGE XR) 500 MG 24 hr tablet; Take 1 tablet (500 mg total) by mouth every evening. With your evening meal  Dispense: 90 tablet; Refill: 1  2. Hyperlipidemia with target LDL less than 100 Low fat diet - Lipid panel - atorvastatin (LIPITOR) 40 MG tablet; Take 1 tablet (40 mg total) by mouth daily.  Dispense: 90 tablet; Refill: 1  3. Essential hypertension Low sodium diet - CMP14+EGFR - amLODipine (NORVASC) 5 MG tablet; Take 1 tablet (5 mg total) by mouth daily.  Dispense: 90 tablet; Refill: 1 - losartan (COZAAR) 100 MG tablet; Take 1 tablet (100 mg total) by mouth daily.  Dispense: 90 tablet; Refill: 1  4. Seizures ([place]) Keep follow up with cardiology  5. Morbid obesity ([place]) Discussed diet and exercise for person with BMI >25 Will recheck weight in 3-6 months  6. Vitamin D deficiency Continue vitamin d supplement  7. Cough Force fluids rest - HYDROcodone-homatropine (HYCODAN) 5-1.5 MG/5ML syrup; Take 5 mLs by mouth every 6 (six) hours as needed for cough.  Dispense: 120 mL; Refill: 0  8. Left upper arm pain - diclofenac sodium (VOLTAREN) 1 % GEL; Apply 2 g topically 2 (two) times daily.  Dispense: 5 Tube; Refill: 2   Labs pending Health Maintenance reviewed Diet and exercise encouraged  Follow up plan: 3 months   [name] Done, FNP

## 2018-02-02 NOTE — Patient Instructions (Signed)

## 2018-02-03 LAB — LIPID PANEL
CHOLESTEROL TOTAL: 152 mg/dL (ref 100–199)
Chol/HDL Ratio: 3 ratio (ref 0.0–4.4)
HDL: 51 mg/dL (ref >39)
LDL CALC: 78 mg/dL (ref 0–99)
TRIGLYCERIDES: 114 mg/dL (ref 0–149)
VLDL CHOLESTEROL CAL: 23 mg/dL (ref 5–40)

## 2018-02-03 LAB — CMP14+EGFR
ALBUMIN: 4.1 g/dL (ref 3.5–5.5)
ALK PHOS: 215 IU/L — AB (ref 39–117)
ALT: 8 IU/L (ref 0–32)
AST: 14 IU/L (ref 0–40)
Albumin/Globulin Ratio: 1.5 (ref 1.2–2.2)
BUN / CREAT RATIO: 18 (ref 9–23)
BUN: 16 mg/dL (ref 6–24)
Bilirubin Total: <0.2 mg/dL (ref 0.0–1.2)
CALCIUM: 8.9 mg/dL (ref 8.7–10.2)
CHLORIDE: 103 mmol/L (ref 96–106)
CO2: 21 mmol/L (ref 20–29)
CREATININE: 0.89 mg/dL (ref 0.57–1.00)
GFR calc Af Amer: 83 mL/min/{1.73_m2} (ref >59)
GFR calc non Af Amer: 72 mL/min/{1.73_m2} (ref >59)
Globulin, Total: 2.7 g/dL (ref 1.5–4.5)
Glucose: 166 mg/dL — ABNORMAL HIGH (ref 65–99)
Potassium: 4.4 mmol/L (ref 3.5–5.2)
Sodium: 142 mmol/L (ref 134–144)
Total Protein: 6.8 g/dL (ref 6.0–8.5)

## 2018-03-01 ENCOUNTER — Telehealth: Payer: Self-pay | Admitting: Nurse Practitioner

## 2018-03-03 NOTE — Telephone Encounter (Signed)
Faxed note to [email or web address]

## 2018-05-06 ENCOUNTER — Ambulatory Visit: Payer: BLUE CROSS/BLUE SHIELD | Admitting: Nurse Practitioner

## 2018-05-11 ENCOUNTER — Encounter: Payer: Self-pay | Admitting: Nurse Practitioner

## 2018-05-19 ENCOUNTER — Ambulatory Visit: Payer: BLUE CROSS/BLUE SHIELD | Admitting: Nurse Practitioner

## 2018-05-19 ENCOUNTER — Other Ambulatory Visit: Payer: Self-pay | Admitting: *Deleted

## 2018-05-19 ENCOUNTER — Encounter: Payer: Self-pay | Admitting: Nurse Practitioner

## 2018-05-19 VITALS — BP 124/80 | HR 71 | Temp 97.3°F | Ht 64.0 in | Wt 181.8 lb

## 2018-05-19 DIAGNOSIS — E785 Hyperlipidemia, unspecified: Secondary | ICD-10-CM

## 2018-05-19 DIAGNOSIS — M792 Neuralgia and neuritis, unspecified: Secondary | ICD-10-CM

## 2018-05-19 DIAGNOSIS — I10 Essential (primary) hypertension: Secondary | ICD-10-CM | POA: Diagnosis not present

## 2018-05-19 DIAGNOSIS — E119 Type 2 diabetes mellitus without complications: Secondary | ICD-10-CM

## 2018-05-19 DIAGNOSIS — R569 Unspecified convulsions: Secondary | ICD-10-CM | POA: Diagnosis not present

## 2018-05-19 DIAGNOSIS — E559 Vitamin D deficiency, unspecified: Secondary | ICD-10-CM

## 2018-05-19 LAB — BAYER DCA HB A1C WAIVED: HB A1C: 6.4 % (ref <7.0)

## 2018-05-19 MED ORDER — LOSARTAN POTASSIUM 100 MG PO TABS: 100.0000 mg | ORAL_TABLET | Freq: Every day | ORAL | 1 refills | Status: DC

## 2018-05-19 MED ORDER — NYSTATIN 100000 UNIT/GM EX CREA: 1.0000 "application " | TOPICAL_CREAM | Freq: Two times a day (BID) | CUTANEOUS | 1 refills | Status: DC

## 2018-05-19 MED ORDER — ATORVASTATIN CALCIUM 40 MG PO TABS: 40.0000 mg | ORAL_TABLET | Freq: Every day | ORAL | 1 refills | Status: DC

## 2018-05-19 MED ORDER — AMLODIPINE BESYLATE 5 MG PO TABS: 5.0000 mg | ORAL_TABLET | Freq: Every day | ORAL | 1 refills | Status: DC

## 2018-05-19 NOTE — Progress Notes (Signed)
Subjective:    Patient ID: Melanie Maynard, female    DOB: Apr 30, 1960, 58 y.o.   MRN: [id number]   Chief Complaint: medical management of chronic issues  HPI:  1. Essential hypertension  No c/o chest pain, sob or headache. Does not check blood pressure at home. BP Readings from Last 3 Encounters:  02/02/18 131/89  10/16/17 (!) 143/89  10/08/17 (!) 138/96     2. Hyperlipidemia with target LDL less than 100  She does not watch diet and does no exercise  3. Type 2 diabetes mellitus treated without insulin (HCC)  Her last hgba1c  Was 6.4%. she does not chck her blood sugars at home. She denies any symptoms of hypoglycemia.  4. Seizures ([place])  Has not had seizure in over 10 years  5. Vitamin D deficiency  takes daily vitamin d supplement when she remembers  6. Neuralgia of right lower extremity  Has slighty numbness in right lower leg that worsens when working  7. Morbid obesity ([place])  No recent weight chnages    Outpatient Encounter Medications as of 05/19/2018  Medication Sig  . amLODipine (NORVASC) 5 MG tablet Take 1 tablet (5 mg total) by mouth daily.  Marland Kitchen atorvastatin (LIPITOR) 40 MG tablet Take 1 tablet (40 mg total) by mouth daily.  . Cholecalciferol (VITAMIN D) 2000 units CAPS Take 1 capsule (2,000 Units total) by mouth daily.  . diclofenac sodium (VOLTAREN) 1 % GEL Apply 2 g topically 2 (two) times daily.  Marland Kitchen glucose blood (ONETOUCH VERIO) test strip Test 1x per day and prn  Dx e11.9  . HYDROcodone-homatropine (HYCODAN) 5-1.5 MG/5ML syrup Take 5 mLs by mouth every 6 (six) hours as needed for cough.  . losartan (COZAAR) 100 MG tablet Take 1 tablet (100 mg total) by mouth daily.  . metFORMIN (GLUCOPHAGE XR) 500 MG 24 hr tablet Take 1 tablet (500 mg total) by mouth every evening. With your evening meal  . ONETOUCH DELICA LANCETS 57Q MISC 1 each by Does not apply route daily. Test 1x per day and prn  Dx E11.9  . phenytoin (DILANTIN) 100 MG ER capsule Take 3 capsules (300 mg  total) by mouth at bedtime.     New complaints: Rash on left breast that has been there for several weeks and it is very itchy.  Social history: Works at [employer]  Constitutional: Negative for activity change and appetite change.  HENT: Negative.   Eyes: Negative for pain.  Respiratory: Negative for shortness of breath.   Cardiovascular: Negative for chest pain, palpitations and leg swelling.  Gastrointestinal: Negative for abdominal pain.  Endocrine: Negative for polydipsia.  Genitourinary: Negative.   Skin: Negative for rash.  Neurological: Negative for dizziness, weakness and headaches.  Hematological: Does not bruise/bleed easily.  Psychiatric/Behavioral: Negative.   All other systems reviewed and are negative.      Objective:   Physical Exam Vitals signs and nursing note reviewed.  Constitutional:      General: She is not in acute distress.    Appearance: Normal appearance. She is well-developed.  HENT:     Head: Normocephalic.     Nose: Nose normal.  Eyes:     Pupils: Pupils are equal, round, and reactive to light.  Neck:     Musculoskeletal: Normal range of motion and neck supple.     Vascular: No carotid bruit or JVD.  Cardiovascular:     Rate and Rhythm: Normal rate and regular rhythm.  Heart sounds: Normal heart sounds.  Pulmonary:     Effort: Pulmonary effort is normal. No respiratory distress.     Breath sounds: Normal breath sounds. No wheezing or rales.  Chest:     Chest wall: No tenderness.  Abdominal:     General: Bowel sounds are normal. There is no distension or abdominal bruit.     Palpations: Abdomen is soft. There is no hepatomegaly, splenomegaly, mass or pulsatile mass.     Tenderness: There is no abdominal tenderness.  Musculoskeletal: Normal range of motion.  Lymphadenopathy:     Cervical: No cervical adenopathy.  Skin:    General: Skin is warm and dry.  Neurological:     Mental Status: She is alert and  oriented to person, place, and time.     Deep Tendon Reflexes: Reflexes are normal and symmetric.  Psychiatric:        Behavior: Behavior normal.        Thought Content: Thought content normal.        Judgment: Judgment normal.    BP 124/80   Pulse 71   Temp (!) 97.3 F (36.3 C) (Oral)   Ht 5\' 4"  (1.626 m)   Wt 181 lb 12.8 oz (82.5 kg)   LMP 01/23/2001 (Approximate)   BMI 31.21 kg/m         Assessment & Plan:  KIMYA MCCAHILL comes in today with chief complaint of No chief complaint on file.   Diagnosis and orders addressed:  1. Essential hypertension Low sodium diet - amLODipine (NORVASC) 5 MG tablet; Take 1 tablet (5 mg total) by mouth daily.  Dispense: 90 tablet; Refill: 1 - losartan (COZAAR) 100 MG tablet; Take 1 tablet (100 mg total) by mouth daily.  Dispense: 90 tablet; Refill: 1  2. Hyperlipidemia with target LDL less than 100 Low fat diet - atorvastatin (LIPITOR) 40 MG tablet; Take 1 tablet (40 mg total) by mouth daily.  Dispense: 90 tablet; Refill: 1  3. Type 2 diabetes mellitus treated without insulin (HCC) Continue carb counting - Bayer DCA Hb A1c Waived  4. Seizures ([place]) Continue meds  5. Vitamin D deficiency Continue daily vitamin d  6. Neuralgia of right lower extremity  7. Morbid obesity ([place]) Discussed diet and exercise for person with BMI >25 Will recheck weight in 3-6 months   Labs pending Health Maintenance reviewed Diet and exercise encouraged  Follow up plan: 3 months   [name] Done, FNP

## 2018-05-19 NOTE — Patient Instructions (Signed)
Diabetes Mellitus and Foot Care  Foot care is an important part of your health, especially when you have diabetes. Diabetes may cause you to have problems because of poor blood flow (circulation) to your feet and legs, which can cause your skin to:   Become thinner and drier.   Break more easily.   Heal more slowly.   Peel and crack.  You may also have nerve damage (neuropathy) in your legs and feet, causing decreased feeling in them. This means that you may not notice minor injuries to your feet that could lead to more serious problems. Noticing and addressing any potential problems early is the best way to prevent future foot problems.  How to care for your feet  Foot hygiene   Wash your feet daily with warm water and mild soap. Do not use hot water. Then, pat your feet and the areas between your toes until they are completely dry. Do not soak your feet as this can dry your skin.   Trim your toenails straight across. Do not dig under them or around the cuticle. File the edges of your nails with an emery board or nail file.   Apply a moisturizing lotion or petroleum jelly to the skin on your feet and to dry, brittle toenails. Use lotion that does not contain alcohol and is unscented. Do not apply lotion between your toes.  Shoes and socks   Wear clean socks or stockings every day. Make sure they are not too tight. Do not wear knee-high stockings since they may decrease blood flow to your legs.   Wear shoes that fit properly and have enough cushioning. Always look in your shoes before you put them on to be sure there are no objects inside.   To break in new shoes, wear them for just a few hours a day. This prevents injuries on your feet.  Wounds, scrapes, corns, and calluses   Check your feet daily for blisters, cuts, bruises, sores, and redness. If you cannot see the bottom of your feet, use a mirror or ask someone for help.   Do not cut corns or calluses or try to remove them with medicine.   If you  find a minor scrape, cut, or break in the skin on your feet, keep it and the skin around it clean and dry. You may clean these areas with mild soap and water. Do not clean the area with peroxide, alcohol, or iodine.   If you have a wound, scrape, corn, or callus on your foot, look at it several times a day to make sure it is healing and not infected. Check for:  ? Redness, swelling, or pain.  ? Fluid or blood.  ? Warmth.  ? Pus or a bad smell.  General instructions   Do not cross your legs. This may decrease blood flow to your feet.   Do not use heating pads or hot water bottles on your feet. They may burn your skin. If you have lost feeling in your feet or legs, you may not know this is happening until it is too late.   Protect your feet from hot and cold by wearing shoes, such as at the beach or on hot pavement.   Schedule a complete foot exam at least once a year (annually) or more often if you have foot problems. If you have foot problems, report any cuts, sores, or bruises to your health care provider immediately.  Contact a health care provider if:     You have a medical condition that increases your risk of infection and you have any cuts, sores, or bruises on your feet.   You have an injury that is not healing.   You have redness on your legs or feet.   You feel burning or tingling in your legs or feet.   You have pain or cramps in your legs and feet.   Your legs or feet are numb.   Your feet always feel cold.   You have pain around a toenail.  Get help right away if:   You have a wound, scrape, corn, or callus on your foot and:  ? You have pain, swelling, or redness that gets worse.  ? You have fluid or blood coming from the wound, scrape, corn, or callus.  ? Your wound, scrape, corn, or callus feels warm to the touch.  ? You have pus or a bad smell coming from the wound, scrape, corn, or callus.  ? You have a fever.  ? You have a red line going up your leg.  Summary   Check your feet every day  for cuts, sores, red spots, swelling, and blisters.   Moisturize feet and legs daily.   Wear shoes that fit properly and have enough cushioning.   If you have foot problems, report any cuts, sores, or bruises to your health care provider immediately.   Schedule a complete foot exam at least once a year (annually) or more often if you have foot problems.  This information is not intended to replace advice given to you by your health care provider. Make sure you discuss any questions you have with your health care provider.  Document Released: 03/07/2000 Document Revised: 04/22/2017 Document Reviewed: 04/11/2016  [employer]vier Interactive Patient Education  2019 [employer]

## 2018-05-19 NOTE — Addendum Note (Signed)
Addended by: Earlene Plater on: 05/19/2018 12:57 PM   Modules accepted: Orders

## 2018-05-20 LAB — CMP14+EGFR
A/G RATIO: 1.7 (ref 1.2–2.2)
ALBUMIN: 4.2 g/dL (ref 3.8–4.9)
ALT: 10 IU/L (ref 0–32)
AST: 15 IU/L (ref 0–40)
Alkaline Phosphatase: 215 IU/L — ABNORMAL HIGH (ref 39–117)
BILIRUBIN TOTAL: 0.3 mg/dL (ref 0.0–1.2)
BUN/Creatinine Ratio: 20 (ref 9–23)
BUN: 17 mg/dL (ref 6–24)
CO2: 22 mmol/L (ref 20–29)
CREATININE: 0.84 mg/dL (ref 0.57–1.00)
Calcium: 9 mg/dL (ref 8.7–10.2)
Chloride: 104 mmol/L (ref 96–106)
GFR, EST AFRICAN AMERICAN: 89 mL/min/{1.73_m2} (ref >59)
GFR, EST NON AFRICAN AMERICAN: 77 mL/min/{1.73_m2} (ref >59)
Globulin, Total: 2.5 g/dL (ref 1.5–4.5)
Glucose: 96 mg/dL (ref 65–99)
Potassium: 4.3 mmol/L (ref 3.5–5.2)
Sodium: 140 mmol/L (ref 134–144)
TOTAL PROTEIN: 6.7 g/dL (ref 6.0–8.5)

## 2018-05-20 LAB — LIPID PANEL
CHOLESTEROL TOTAL: 129 mg/dL (ref 100–199)
Chol/HDL Ratio: 2.6 ratio (ref 0.0–4.4)
HDL: 49 mg/dL (ref >39)
LDL Calculated: 64 mg/dL (ref 0–99)
TRIGLYCERIDES: 82 mg/dL (ref 0–149)
VLDL Cholesterol Cal: 16 mg/dL (ref 5–40)

## 2018-06-01 DIAGNOSIS — Z1231 Encounter for screening mammogram for malignant neoplasm of breast: Secondary | ICD-10-CM | POA: Diagnosis not present

## 2018-06-01 LAB — HM MAMMOGRAPHY

## 2018-08-27 ENCOUNTER — Other Ambulatory Visit: Payer: Self-pay

## 2018-08-30 ENCOUNTER — Encounter: Payer: Self-pay | Admitting: Nurse Practitioner

## 2018-08-30 ENCOUNTER — Ambulatory Visit (INDEPENDENT_AMBULATORY_CARE_PROVIDER_SITE_OTHER): Payer: BC Managed Care – PPO | Admitting: Nurse Practitioner

## 2018-08-30 DIAGNOSIS — I10 Essential (primary) hypertension: Secondary | ICD-10-CM | POA: Diagnosis not present

## 2018-08-30 DIAGNOSIS — E119 Type 2 diabetes mellitus without complications: Secondary | ICD-10-CM

## 2018-08-30 DIAGNOSIS — E785 Hyperlipidemia, unspecified: Secondary | ICD-10-CM

## 2018-08-30 DIAGNOSIS — E559 Vitamin D deficiency, unspecified: Secondary | ICD-10-CM

## 2018-08-30 DIAGNOSIS — M25511 Pain in right shoulder: Secondary | ICD-10-CM

## 2018-08-30 DIAGNOSIS — R569 Unspecified convulsions: Secondary | ICD-10-CM

## 2018-08-30 MED ORDER — LOSARTAN POTASSIUM 100 MG PO TABS: 100.0000 mg | ORAL_TABLET | Freq: Every day | ORAL | 1 refills | Status: DC

## 2018-08-30 MED ORDER — AMLODIPINE BESYLATE 5 MG PO TABS: 5.0000 mg | ORAL_TABLET | Freq: Every day | ORAL | 1 refills | Status: DC

## 2018-08-30 MED ORDER — METFORMIN HCL ER 500 MG PO TB24: 500.0000 mg | ORAL_TABLET | Freq: Every evening | ORAL | 1 refills | Status: DC

## 2018-08-30 MED ORDER — ATORVASTATIN CALCIUM 40 MG PO TABS: 40.0000 mg | ORAL_TABLET | Freq: Every day | ORAL | 1 refills | Status: DC

## 2018-08-30 NOTE — Progress Notes (Signed)
Patient ID: Melanie Maynard, female   DOB: May 22, 1960, 58 y.o.   MRN: [id number]     Virtual Visit via telephone Note  I connected with Melanie Maynard on 08/30/18 at 10 AM by telephone and verified that I am speaking with the correct person using two identifiers. Melanie Maynard is currently located at home and no one is currently with her during visit. The provider, [name] Done, FNP is located in their office at time of visit.  I discussed the limitations, risks, security and privacy concerns of performing an evaluation and management service by telephone and the availability of in person appointments. I also discussed with the patient that there may be a patient responsible charge related to this service. The patient expressed understanding and agreed to proceed.   History and Present Illness:   Chief Complaint: medical management  Of chronic issues  HPI:  1. Essential hypertension No c/oi chest pain, sob or headache. does not check blood pressure at home. BP Readings from Last 3 Encounters:  05/19/18 124/80  02/02/18 131/89  10/16/17 (!) 143/89     2. Hyperlipidemia with target LDL less than 100 Does not watch diet and does no exercise.  3. Type 2 diabetes mellitus treated without insulin (HCC) Last HGBA1c was 6.4%. She has not been testing blood sugars in the last several months. Denies any symptoms of low blood sugars.  4. Seizures ([place]) Has not had seizure in over 20 years. She is still taking dilantin. Sees neurology in July  2020  5. Vitamin D deficiency Not taking vitamin d supplement  6. Morbid obesity ([place]) No recent weight changes.    Outpatient Encounter Medications as of 08/30/2018  Medication Sig  . amLODipine (NORVASC) 5 MG tablet Take 1 tablet (5 mg total) by mouth daily.  Marland Kitchen atorvastatin (LIPITOR) 40 MG tablet Take 1 tablet (40 mg total) by mouth daily.  . Cholecalciferol (VITAMIN D) 2000 units CAPS Take 1 capsule (2,000 Units total) by mouth daily.  .  diclofenac sodium (VOLTAREN) 1 % GEL Apply 2 g topically 2 (two) times daily.  Marland Kitchen glucose blood (ONETOUCH VERIO) test strip Test 1x per day and prn  Dx e11.9  . losartan (COZAAR) 100 MG tablet Take 1 tablet (100 mg total) by mouth daily.  . metFORMIN (GLUCOPHAGE XR) 500 MG 24 hr tablet Take 1 tablet (500 mg total) by mouth every evening. With your evening meal  . nystatin cream (MYCOSTATIN) Apply 1 application topically 2 (two) times daily.  Glory Rosebush DELICA LANCETS 97L MISC 1 each by Does not apply route daily. Test 1x per day and prn  Dx E11.9  . phenytoin (DILANTIN) 100 MG ER capsule Take 3 capsules (300 mg total) by mouth at bedtime.     Past Surgical History:  Procedure Laterality Date  . ABDOMINAL HYSTERECTOMY    . APPENDECTOMY    . CATARACT EXTRACTION W/PHACO Right 09/26/2016   Procedure: CATARACT EXTRACTION PHACO AND INTRAOCULAR LENS PLACEMENT (IOC);  Surgeon: Baruch Goldmann, MD;  Location: [facility];  Service: Ophthalmology;  Laterality: Right;  CDE: 3.16  . CHOLECYSTECTOMY      Family History  Problem Relation Age of Onset  . Diabetes Mother   . Heart attack Mother 78  . Healthy Father   . Alcohol abuse Father   . Diabetes Sister   . Cancer Sister        breast  . Cirrhosis Brother   . Leukemia Sister   . Diabetes Sister   .  Kidney disease Sister        on dialysis    New complaints: She is having left shoulder pain. She was getting over a cough today - said she will make appointment for shoulder injection in a few weeks..  Social history: Works at [employer] and is working on a week off a week right now.     Review of Systems  Constitutional: Negative for diaphoresis and weight loss.  Eyes: Negative for blurred vision, double vision and pain.  Respiratory: Negative for shortness of breath.   Cardiovascular: Negative for chest pain, palpitations, orthopnea and leg swelling.  Gastrointestinal: Negative for abdominal pain.  Musculoskeletal: Positive for  joint pain (right shoulder).  Skin: Negative for rash.  Neurological: Negative for dizziness, sensory change, loss of consciousness, weakness and headaches.  Endo/Heme/Allergies: Negative for polydipsia. Does not bruise/bleed easily.  Psychiatric/Behavioral: Negative for memory loss. The patient does not have insomnia.   All other systems reviewed and are negative.    Observations/Objective: Alert and oriented No distress  Assessment and Plan: Melanie Maynard comes in today with chief complaint of No chief complaint on file.   Diagnosis and orders addressed:  1. Essential hypertension Low sodium diet - amLODipine (NORVASC) 5 MG tablet; Take 1 tablet (5 mg total) by mouth daily.  Dispense: 90 tablet; Refill: 1 - losartan (COZAAR) 100 MG tablet; Take 1 tablet (100 mg total) by mouth daily.  Dispense: 90 tablet; Refill: 1  2. Hyperlipidemia with target LDL less than 100 Low fat diet - atorvastatin (LIPITOR) 40 MG tablet; Take 1 tablet (40 mg total) by mouth daily.  Dispense: 90 tablet; Refill: 1  3. Type 2 diabetes mellitus treated without insulin (HCC) Continue low cab diet  - metFORMIN (GLUCOPHAGE XR) 500 MG 24 hr tablet; Take 1 tablet (500 mg total) by mouth every evening. With your evening meal  Dispense: 90 tablet; Refill: 1  4. Seizures ([place]) Keep follow up appointment with neurology  5. Vitamin D deficiency Daily vitamin d supplement  6. Morbid obesity ([place]) Discussed diet and exercise for person with BMI >25 Will recheck weight in 3-6 months  7. Acute pain of right shoulder Patient will make appointment for joint injection   Previous labs results reviewed Health Maintenance reviewed Diet and exercise encouraged  Follow up plan: 3 months      I discussed the assessment and treatment plan with the patient. The patient was provided an opportunity to ask questions and all were answered. The patient agreed with the plan and demonstrated an understanding of the  instructions.   The patient was advised to call back or seek an in-person evaluation if the symptoms worsen or if the condition fails to improve as anticipated.  The above assessment and management plan was discussed with the patient. The patient verbalized understanding of and has agreed to the management plan. Patient is aware to call the clinic if symptoms persist or worsen. Patient is aware when to return to the clinic for a follow-up visit. Patient educated on when it is appropriate to go to the emergency department.   Time call ended:  10:15 PM  I provided 15 minutes of non-face-to-face time during this encounter.    [name] Done, FNP

## 2018-09-22 ENCOUNTER — Other Ambulatory Visit: Payer: Self-pay

## 2018-09-23 ENCOUNTER — Encounter: Payer: Self-pay | Admitting: Nurse Practitioner

## 2018-09-23 ENCOUNTER — Ambulatory Visit (INDEPENDENT_AMBULATORY_CARE_PROVIDER_SITE_OTHER): Payer: BC Managed Care – PPO | Admitting: Nurse Practitioner

## 2018-09-23 VITALS — BP 128/87 | HR 82 | Temp 98.5°F | Ht 64.0 in | Wt 184.0 lb

## 2018-09-23 DIAGNOSIS — M7918 Myalgia, other site: Secondary | ICD-10-CM | POA: Diagnosis not present

## 2018-09-23 MED ORDER — PREDNISONE 20 MG PO TABS: ORAL_TABLET | ORAL | 0 refills | Status: DC

## 2018-09-23 NOTE — Progress Notes (Signed)
   Subjective:    Patient ID: Melanie Maynard, female    DOB: 04/22/1960, 58 y.o.   MRN: [id number]   Chief Complaint: Left shoulder injection   HPI Patient comes in today for left shoulder injection. Has had pain off and on for several months. Some days it is ok and other days she can hardly move it. Pain is not actually in shoulder joint. It is in her deltoid area. We gave her voltaren gel and it has not helped. Sometimes she feels like there is a knot there. Not bothering her today.   Review of Systems  Constitutional: Negative for activity change and appetite change.  HENT: Negative.   Eyes: Negative for pain.  Respiratory: Negative for shortness of breath.   Cardiovascular: Negative for chest pain, palpitations and leg swelling.  Gastrointestinal: Negative for abdominal pain.  Endocrine: Negative for polydipsia.  Genitourinary: Negative.   Skin: Negative for rash.  Neurological: Negative for dizziness, weakness and headaches.  Hematological: Does not bruise/bleed easily.  Psychiatric/Behavioral: Negative.   All other systems reviewed and are negative.      Objective:   Physical Exam Vitals signs and nursing note reviewed.  Constitutional:      Appearance: Normal appearance.  Cardiovascular:     Rate and Rhythm: Normal rate and regular rhythm.     Heart sounds: Normal heart sounds.  Pulmonary:     Breath sounds: Normal breath sounds.  Abdominal:     General: Abdomen is flat.     Palpations: Abdomen is soft.  Musculoskeletal:     Comments: Pain with palpable knot on left deltoid- mildly tender to touch  Skin:    General: Skin is warm and dry.  Neurological:     General: No focal deficit present.     Mental Status: She is alert and oriented to person, place, and time.    BP 128/87   Pulse 82   Temp 98.5 F (36.9 C) (Oral)   Ht 5\' 4"  (1.626 m)   Wt 184 lb (83.5 kg)   LMP 01/23/2001 (Approximate)   BMI 31.58 kg/m         Assessment & Plan:  Melanie Maynard  in today with chief complaint of Left shoulder injection   1. Pain of left deltoid Moist heat to area Strict low carb diet - blood sugars may go up while on steroids Will call after get [place] report - predniSONE (DELTASONE) 20 MG tablet; 2 po at sametime daily for 5 days  Dispense: 10 tablet; Refill: 0 - [place] LT UPPER EXTREM LTD SOFT TISSUE NON VASCULAR; Future  [name] Done, FNP

## 2018-10-01 ENCOUNTER — Ambulatory Visit (HOSPITAL_COMMUNITY)
Admission: RE | Admit: 2018-10-01 | Discharge: 2018-10-01 | Disposition: A | Discharge status: Home / Self Care | Payer: BC Managed Care – PPO | Source: Ambulatory Visit | Attending: Nurse Practitioner | Admitting: Nurse Practitioner

## 2018-10-01 ENCOUNTER — Telehealth: Payer: Self-pay | Admitting: Nurse Practitioner

## 2018-10-01 ENCOUNTER — Other Ambulatory Visit: Payer: Self-pay

## 2018-10-01 DIAGNOSIS — M7918 Myalgia, other site: Secondary | ICD-10-CM | POA: Diagnosis not present

## 2018-10-01 DIAGNOSIS — M25512 Pain in left shoulder: Secondary | ICD-10-CM | POA: Diagnosis not present

## 2018-10-01 DIAGNOSIS — E119 Type 2 diabetes mellitus without complications: Secondary | ICD-10-CM

## 2018-10-01 MED ORDER — METFORMIN HCL ER 500 MG PO TB24: 500.0000 mg | ORAL_TABLET | Freq: Every evening | ORAL | 1 refills | Status: DC

## 2018-10-01 NOTE — Telephone Encounter (Signed)
Left detailed message stating refill has been sent to the pharmacy and to call back with any questions or concerns.

## 2018-10-01 NOTE — Telephone Encounter (Signed)
What is the name of the medication? metformin  Have you contacted your pharmacy to request a refill? yes  Which pharmacy would you like this sent to? [facility] she is out of medication she has not taken it in 5 days she thought it was the one that was recalled but it turns out it is not    Patient notified that their request is being sent to the clinical staff for review and that they should receive a call once it is complete. If they do not receive a call within 24 hours they can check with their pharmacy or our office.

## 2018-10-10 NOTE — Progress Notes (Signed)
PATIENT: Melanie Maynard DOB: [birth year]  REASON FOR VISIT: follow up HISTORY FROM: patient  HISTORY OF PRESENT ILLNESS: Today 10/11/18  Melanie Maynard is a 58 year old female with history of seizure disorder. She remains on Dilantin and is tolerating well without side effect. She has not had recurrent seizure.  She indicates her overall health has been good since last visit.  She does have history of diabetes, her last A1c was 6.4.  She is employed full-time, as a Glass blower/designer.  She drives a car without difficulty.  She denies any new problems or concerns.  She presents today for follow-up unaccompanied.  She is requesting a refill of Dilantin for a year supply.  She says in the past they have tried to decrease her dose of Dilantin, but it caused her to have small seizures.  She has not had a seizure in over 20 years.  HISTORY 10/08/2017 CM: Ms. Riga, 58 year old black female returns for  yearly followup. She has a history of seizure disorder, last seizure was approximately 23 years ago. She is currently on Dilantin without side effects. She denies daytime drowsiness or feelings of being off balance. She has had no evidence of bowel or bladder incontinence, no tongue biting, no jerking of extremities. Sleeping well, appetite is reportedly good. Medical history also includes hypertension and elevated cholesterol. Blood pressure  In the office today 138/96.Recent labs at primary care in March were reviewed CBC and CMP.  She returns for reevaluation and refills  REVIEW OF SYSTEMS: Out of a complete 14 system review of symptoms, the patient complains only of the following symptoms, and all other reviewed systems are negative.  Seizures  ALLERGIES: Allergies  Allergen Reactions  . Gabapentin Other (See Comments)    Chest pain    HOME MEDICATIONS: Outpatient Medications Prior to Visit  Medication Sig Dispense Refill  . amLODipine (NORVASC) 5 MG tablet Take 1 tablet (5 mg total) by mouth daily.  90 tablet 1  . atorvastatin (LIPITOR) 40 MG tablet Take 1 tablet (40 mg total) by mouth daily. 90 tablet 1  . Cholecalciferol (VITAMIN D) 2000 units CAPS Take 1 capsule (2,000 Units total) by mouth daily. 100 capsule 3  . glucose blood (ONETOUCH VERIO) test strip Test 1x per day and prn  Dx e11.9 100 each 12  . losartan (COZAAR) 100 MG tablet Take 1 tablet (100 mg total) by mouth daily. 90 tablet 1  . metFORMIN (GLUCOPHAGE XR) 500 MG 24 hr tablet Take 1 tablet (500 mg total) by mouth every evening. With your evening meal 90 tablet 1  . Multiple Vitamins-Minerals (CENTRUM SILVER ADULT 50+) TABS Take 1 capsule by mouth.    Glory Rosebush DELICA LANCETS 00X MISC 1 each by Does not apply route daily. Test 1x per day and prn  Dx E11.9 100 each 3  . phenytoin (DILANTIN) 100 MG ER capsule Take 3 capsules (300 mg total) by mouth at bedtime. 270 capsule 3  . diclofenac sodium (VOLTAREN) 1 % GEL Apply 2 g topically 2 (two) times daily. 5 Tube 2  . nystatin cream (MYCOSTATIN) Apply 1 application topically 2 (two) times daily. 30 g 1  . predniSONE (DELTASONE) 20 MG tablet 2 po at sametime daily for 5 days 10 tablet 0   No facility-administered medications prior to visit.     PAST MEDICAL HISTORY: Past Medical History:  Diagnosis Date  . Diabetes mellitus without complication ([place])   . Hypertension   . Seizures ([place])  PAST SURGICAL HISTORY: Past Surgical History:  Procedure Laterality Date  . ABDOMINAL HYSTERECTOMY    . APPENDECTOMY    . CATARACT EXTRACTION W/PHACO Right 09/26/2016   Procedure: CATARACT EXTRACTION PHACO AND INTRAOCULAR LENS PLACEMENT (IOC);  Surgeon: Baruch Goldmann, MD;  Location: [facility];  Service: Ophthalmology;  Laterality: Right;  CDE: 3.16  . CHOLECYSTECTOMY      FAMILY HISTORY: Family History  Problem Relation Age of Onset  . Diabetes Mother   . Heart attack Mother 76  . Healthy Father   . Alcohol abuse Father   . Diabetes Sister   . Cancer Sister        breast  .  Cirrhosis Brother   . Leukemia Sister   . Diabetes Sister   . Kidney disease Sister        on dialysis    SOCIAL HISTORY: Social History   Socioeconomic History  . Marital status: Significant Other    Spouse name: Not on file  . Number of children: 0  . Years of education: 72  . Highest education level: Not on file  Occupational History    Employer: [employer]  . Financial resource strain: Not on file  . Food insecurity    Worry: Not on file    Inability: Not on file  . Transportation needs    Medical: Not on file    Non-medical: Not on file  Tobacco Use  . Smoking status: Current Every Day Smoker    Packs/day: 1.00    Types: Cigarettes  . Smokeless tobacco: Never Used  Substance and Sexual Activity  . Alcohol use: No    Alcohol/week: 0.0 standard drinks  . Drug use: No  . Sexual activity: Not on file  Lifestyle  . Physical activity    Days per week: Not on file    Minutes per session: Not on file  . Stress: Not on file  Relationships  . Social Herbalist on phone: Not on file    Gets together: Not on file    Attends religious service: Not on file    Active member of club or organization: Not on file    Attends meetings of clubs or organizations: Not on file    Relationship status: Not on file  . Intimate partner violence    Fear of current or ex partner: Not on file    Emotionally abused: Not on file    Physically abused: Not on file    Forced sexual activity: Not on file  Other Topics Concern  . Not on file  Social History Narrative   Patient is single with no children   Patient is right handed   Patient has a high school education   Patient drinks 2-3 cups daily      PHYSICAL EXAM  Vitals:   10/11/18 0755  BP: (!) 151/95  Pulse: 72  Temp: 98.2 F (36.8 C)  Weight: 185 lb 6.4 oz (84.1 kg)  Height: 5\' 4"  (1.626 m)   Body mass index is 31.82 kg/m.  Generalized: Well developed, in no acute distress    Neurological examination  Mentation: Alert oriented to time, place, history taking. Follows all commands speech and language fluent Cranial nerve II-XII: Pupils were equal round reactive to light. Extraocular movements were full, visual field were full on confrontational test. Facial sensation and strength were normal. Uvula tongue midline. Head turning and shoulder shrug  were normal and symmetric. Motor: The motor testing  reveals 5 over 5 strength of all 4 extremities. Good symmetric motor tone is noted throughout.  Sensory: Sensory testing is intact to soft touch on all 4 extremities. No evidence of extinction is noted.  Coordination: Cerebellar testing reveals good finger-nose-finger and heel-to-shin bilaterally.  Gait and station: Gait is normal. Tandem gait is normal. Romberg is negative. No drift is seen.  Reflexes: Deep tendon reflexes are symmetric and normal bilaterally.   DIAGNOSTIC DATA (LABS, IMAGING, TESTING) - I reviewed patient records, labs, notes, testing and imaging myself where available.  Lab Results  Component Value Date   WBC 10.0 06/18/2017   HGB 13.3 06/18/2017   HCT 39.7 06/18/2017   MCV 77 (L) 06/18/2017   PLT 230 06/18/2017      Component Value Date/Time   NA 140 05/19/2018 1257   K 4.3 05/19/2018 1257   CL 104 05/19/2018 1257   CO2 22 05/19/2018 1257   GLUCOSE 96 05/19/2018 1257   GLUCOSE 149 (H) 09/22/2016 0801   BUN 17 05/19/2018 1257   CREATININE 0.84 05/19/2018 1257   CREATININE 0.77 09/22/2012 1213   CALCIUM 9.0 05/19/2018 1257   PROT 6.7 05/19/2018 1257   ALBUMIN 4.2 05/19/2018 1257   AST 15 05/19/2018 1257   ALT 10 05/19/2018 1257   ALKPHOS 215 (H) 05/19/2018 1257   BILITOT 0.3 05/19/2018 1257   GFRNONAA 77 05/19/2018 1257   GFRNONAA 89 09/22/2012 1213   GFRAA 89 05/19/2018 1257   GFRAA >89 09/22/2012 1213   Lab Results  Component Value Date   CHOL 129 05/19/2018   HDL 49 05/19/2018   LDLCALC 64 05/19/2018   TRIG 82 05/19/2018    CHOLHDL 2.6 05/19/2018   Lab Results  Component Value Date   HGBA1C 6.4 05/19/2018   No results found for: [id number] Lab Results  Component Value Date   TSH 2.370 06/18/2017    ASSESSMENT AND PLAN 58 y.o. year old female  has a past medical history of Diabetes mellitus without complication ([place]), Hypertension, and Seizures ([place]). here with:  1.  Seizures  She will continue taking Dilantin at current dose. I will check a Dilantin level today. She is on multivitamin.  She has had basic lab work done at her primary recently for review (CMP 05/19/2018, stable elevated alkaline phosphatase 215, creatinine normal 0.84).  She has not had a seizure in over 20 years.  I will send in a year supply of Dilantin refill.  She will follow-up in 1 year or sooner if needed.  I advised her that if her symptoms worsen or she develops any new symptoms she should let us know.  I spent 15 minutes with the patient. 50% of this time was spent discussing her plan of care.  Butler Denmark, AGNP-C, DNP 10/11/2018, 8:24 AM [facility] [address] [place], [place] [address] [phone]

## 2018-10-11 ENCOUNTER — Encounter: Payer: Self-pay | Admitting: Neurology

## 2018-10-11 ENCOUNTER — Other Ambulatory Visit: Payer: Self-pay

## 2018-10-11 ENCOUNTER — Ambulatory Visit: Payer: BLUE CROSS/BLUE SHIELD | Admitting: Nurse Practitioner

## 2018-10-11 ENCOUNTER — Ambulatory Visit: Payer: BC Managed Care – PPO | Admitting: Neurology

## 2018-10-11 VITALS — BP 151/95 | HR 72 | Temp 98.2°F | Ht 64.0 in | Wt 185.4 lb

## 2018-10-11 DIAGNOSIS — R569 Unspecified convulsions: Secondary | ICD-10-CM

## 2018-10-11 MED ORDER — PHENYTOIN SODIUM EXTENDED 100 MG PO CAPS: 300.0000 mg | ORAL_CAPSULE | Freq: Every day | ORAL | 3 refills | Status: DC

## 2018-10-11 NOTE — Patient Instructions (Signed)
It was wonderful to meet you! You look great today! I will see you in 1 year!

## 2018-10-11 NOTE — Progress Notes (Signed)
I have read the note, and I agree with the clinical assessment and plan.  Charles K Willis   

## 2018-10-12 LAB — PHENYTOIN LEVEL, TOTAL: Phenytoin (Dilantin), Serum: 3.9 ug/mL — ABNORMAL LOW (ref 10.0–20.0)

## 2018-10-13 ENCOUNTER — Telehealth: Payer: Self-pay | Admitting: *Deleted

## 2018-10-13 NOTE — Telephone Encounter (Signed)
Called patient and informed her that her Dilantin level is low, but has been this way for many years. She hasn't had a seizure in over 20 years. No changes in medication. She verbalized understanding, appreciation.

## 2018-11-30 ENCOUNTER — Encounter: Payer: Self-pay | Admitting: Nurse Practitioner

## 2018-11-30 ENCOUNTER — Ambulatory Visit (INDEPENDENT_AMBULATORY_CARE_PROVIDER_SITE_OTHER): Payer: BC Managed Care – PPO | Admitting: Nurse Practitioner

## 2018-11-30 ENCOUNTER — Other Ambulatory Visit: Payer: Self-pay

## 2018-11-30 DIAGNOSIS — R569 Unspecified convulsions: Secondary | ICD-10-CM

## 2018-11-30 DIAGNOSIS — E785 Hyperlipidemia, unspecified: Secondary | ICD-10-CM

## 2018-11-30 DIAGNOSIS — E119 Type 2 diabetes mellitus without complications: Secondary | ICD-10-CM

## 2018-11-30 DIAGNOSIS — E559 Vitamin D deficiency, unspecified: Secondary | ICD-10-CM

## 2018-11-30 DIAGNOSIS — I10 Essential (primary) hypertension: Secondary | ICD-10-CM | POA: Diagnosis not present

## 2018-11-30 NOTE — Progress Notes (Signed)
Virtual Visit via telephone Note Due to COVID-19 pandemic this visit was conducted virtually. This visit type was conducted due to national recommendations for restrictions regarding the COVID-19 Pandemic (e.g. social distancing, sheltering in place) in an effort to limit this patient's exposure and mitigate transmission in our community. All issues noted in this document were discussed and addressed.  A physical exam was not performed with this format.  I connected with Melanie Maynard on 11/30/18 at 12:40 by telephone and verified that I am speaking with the correct person using two identifiers. Melanie Maynard is currently located at home and  No one is currently with her during visit. The provider, Melanie Hassell Done, FNP is located in their office at time of visit.  I discussed the limitations, risks, security and privacy concerns of performing an evaluation and management service by telephone and the availability of in person appointments. I also discussed with the patient that there may be a patient responsible charge related to this service. The patient expressed understanding and agreed to proceed.   History and Present Illness:   Chief Complaint: Medical Management of Chronic Issues    HPI:  1. Type 2 diabetes mellitus treated without insulin (Melanie Maynard) Does ot check blood sugar at home everyday. She only checks randomly and is over 200 sometimes. Lab Results  Component Value Date   HGBA1C 6.4 05/19/2018     2. Essential hypertension No c/o chest pian , sob or headache. Does not check blood pressure at home. BP Readings from Last 3 Encounters:  10/11/18 (!) 151/95  09/23/18 128/87  05/19/18 124/80     3. Hyperlipidemia with target LDL less than 100 Does not watch diet and does very  Little exercise. Lab Results  Component Value Date   CHOL 129 05/19/2018   HDL 49 05/19/2018   LDLCALC 64 05/19/2018   TRIG 82 05/19/2018   CHOLHDL 2.6 05/19/2018     4. Vitamin D  deficiency Does not always take vitamin d supplement  5. Seizures (Melanie Maynard) No recent seizure activity  6. Morbid obesity (Melanie Maynard) No recent weight changes.    Outpatient Encounter Medications as of 11/30/2018  Medication Sig  . amLODipine (NORVASC) 5 MG tablet Take 1 tablet (5 mg total) by mouth daily.  Marland Kitchen atorvastatin (LIPITOR) 40 MG tablet Take 1 tablet (40 mg total) by mouth daily.  . Cholecalciferol (VITAMIN D) 2000 units CAPS Take 1 capsule (2,000 Units total) by mouth daily.  Marland Kitchen glucose blood (ONETOUCH VERIO) test strip Test 1x per day and prn  Dx e11.9  . losartan (COZAAR) 100 MG tablet Take 1 tablet (100 mg total) by mouth daily.  . metFORMIN (GLUCOPHAGE XR) 500 MG 24 hr tablet Take 1 tablet (500 mg total) by mouth every evening. With your evening meal  . Multiple Vitamins-Minerals (CENTRUM SILVER ADULT 50+) TABS Take 1 capsule by mouth.  Melanie Maynard DELICA LANCETS [id number] MISC 1 each by Does not apply route daily. Test 1x per day and prn  Dx E11.9  . phenytoin (DILANTIN) 100 MG ER capsule Take 3 capsules (300 mg total) by mouth at bedtime.     Past Surgical History:  Procedure Laterality Date  . ABDOMINAL HYSTERECTOMY    . APPENDECTOMY    . CATARACT EXTRACTION W/PHACO Right 09/26/2016   Procedure: CATARACT EXTRACTION PHACO AND INTRAOCULAR LENS PLACEMENT (IOC);  Surgeon: Baruch Goldmann, MD;  Location: [facility];  Service: Ophthalmology;  Laterality: Right;  CDE: 3.16  . CHOLECYSTECTOMY  Family History  Problem Relation Age of Onset  . Diabetes Mother   . Heart attack Mother 42  . Healthy Father   . Alcohol abuse Father   . Diabetes Sister   . Cancer Sister        breast  . Cirrhosis Brother   . Leukemia Sister   . Diabetes Sister   . Kidney disease Sister        on dialysis    New complaints: None today  Social history: Still working at [employer] substance contract: n/a    Review of Systems  Constitutional: Negative for diaphoresis and weight  loss.  Eyes: Negative for blurred vision, double vision and pain.  Respiratory: Positive for cough (from sinus drainage). Negative for shortness of breath.   Cardiovascular: Negative for chest pain, palpitations, orthopnea and leg swelling.  Gastrointestinal: Negative for abdominal pain.  Skin: Negative for rash.  Neurological: Negative for dizziness, sensory change, loss of consciousness, weakness and headaches.  Endo/Heme/Allergies: Negative for polydipsia. Does not bruise/bleed easily.  Psychiatric/Behavioral: Negative for memory loss. The patient does not have insomnia.   All other systems reviewed and are negative.    Observations/Objective: Alert and oriented- answers all questions appropriately No distress    Assessment and Plan:  Melanie Maynard comes in today with chief complaint of Medical Management of Chronic Issues   Diagnosis and orders addressed:  1. Type 2 diabetes mellitus treated without insulin (HCC) Continue to watch carbs in diet   2. Essential hypertension Low sodium diet  3. Hyperlipidemia with target LDL less than 100 Low fat diet  4. Vitamin D deficiency Continue daily vitamin d supplement  5. Seizures (Melanie Maynard) Keep follow up with neurology  6. Morbid obesity (Melanie Maynard) Discussed diet and exercise for person with BMI >25 Will recheck weight in 3-6 months   Previous lab results reviewed Health Maintenance reviewed Diet and exercise encouraged  Follow up plan: 3 months       I discussed the assessment and treatment plan with the patient. The patient was provided an opportunity to ask questions and all were answered. The patient agreed with the plan and demonstrated an understanding of the instructions.   The patient was advised to call back or seek an in-person evaluation if the symptoms worsen or if the condition fails to improve as anticipated.  The above assessment and management plan was discussed with the patient. The patient verbalized  understanding of and has agreed to the management plan. Patient is aware to call the clinic if symptoms persist or worsen. Patient is aware when to return to the clinic for a follow-up visit. Patient educated on when it is appropriate to go to the emergency department.   Time call ended:  12:55  I provided 15 minutes of non-face-to-face time during this encounter.    Melanie Hassell Done, FNP

## 2019-02-14 DIAGNOSIS — H52223 Regular astigmatism, bilateral: Secondary | ICD-10-CM | POA: Diagnosis not present

## 2019-02-14 DIAGNOSIS — Z961 Presence of intraocular lens: Secondary | ICD-10-CM | POA: Diagnosis not present

## 2019-02-14 DIAGNOSIS — H524 Presbyopia: Secondary | ICD-10-CM | POA: Diagnosis not present

## 2019-02-14 DIAGNOSIS — H5203 Hypermetropia, bilateral: Secondary | ICD-10-CM | POA: Diagnosis not present

## 2019-02-14 DIAGNOSIS — H5213 Myopia, bilateral: Secondary | ICD-10-CM | POA: Diagnosis not present

## 2019-02-14 LAB — HM DIABETES EYE EXAM

## 2019-03-02 ENCOUNTER — Other Ambulatory Visit: Payer: Self-pay

## 2019-03-03 ENCOUNTER — Encounter: Payer: Self-pay | Admitting: Nurse Practitioner

## 2019-03-03 ENCOUNTER — Ambulatory Visit (INDEPENDENT_AMBULATORY_CARE_PROVIDER_SITE_OTHER): Payer: BC Managed Care – PPO | Admitting: Nurse Practitioner

## 2019-03-03 ENCOUNTER — Other Ambulatory Visit: Payer: Self-pay

## 2019-03-03 VITALS — BP 137/90 | HR 74 | Temp 97.3°F | Resp 20 | Ht 64.0 in | Wt 184.0 lb

## 2019-03-03 DIAGNOSIS — Z23 Encounter for immunization: Secondary | ICD-10-CM

## 2019-03-03 DIAGNOSIS — R569 Unspecified convulsions: Secondary | ICD-10-CM | POA: Diagnosis not present

## 2019-03-03 DIAGNOSIS — E785 Hyperlipidemia, unspecified: Secondary | ICD-10-CM

## 2019-03-03 DIAGNOSIS — E119 Type 2 diabetes mellitus without complications: Secondary | ICD-10-CM | POA: Diagnosis not present

## 2019-03-03 DIAGNOSIS — E559 Vitamin D deficiency, unspecified: Secondary | ICD-10-CM

## 2019-03-03 DIAGNOSIS — I10 Essential (primary) hypertension: Secondary | ICD-10-CM

## 2019-03-03 LAB — BAYER DCA HB A1C WAIVED: HB A1C (BAYER DCA - WAIVED): 6.6 % (ref <7.0)

## 2019-03-03 MED ORDER — LOSARTAN POTASSIUM 100 MG PO TABS: 100.0000 mg | ORAL_TABLET | Freq: Every day | ORAL | 1 refills | Status: DC

## 2019-03-03 MED ORDER — METFORMIN HCL ER 500 MG PO TB24: 500.0000 mg | ORAL_TABLET | Freq: Every evening | ORAL | 1 refills | Status: DC

## 2019-03-03 MED ORDER — AMLODIPINE BESYLATE 5 MG PO TABS: 5.0000 mg | ORAL_TABLET | Freq: Every day | ORAL | 1 refills | Status: DC

## 2019-03-03 MED ORDER — ATORVASTATIN CALCIUM 40 MG PO TABS: 40.0000 mg | ORAL_TABLET | Freq: Every day | ORAL | 1 refills | Status: DC

## 2019-03-03 MED ORDER — PHENYTOIN SODIUM EXTENDED 100 MG PO CAPS: 300.0000 mg | ORAL_CAPSULE | Freq: Every day | ORAL | 1 refills | Status: DC

## 2019-03-03 NOTE — Progress Notes (Signed)
Subjective:    Patient ID: Melanie Maynard, female    DOB: Jan 14, 1961, 58 y.o.   MRN: [id number]   Chief Complaint: Medical Management of Chronic Issues    HPI:  1. Type 2 diabetes mellitus treated without insulin (HCC) Fasting blood sugars are not checked at home. She does try to watch diet some. She denies any low blood sugar symptoms. Lab Results  Component Value Date   HGBA1C 6.4 05/19/2018     2. Essential hypertension No c/o chest pain, sob or headache. Does not check blood pressure at home. BP Readings from Last 3 Encounters:  03/03/19 137/90  10/11/18 (!) 151/95  09/23/18 128/87     3. Hyperlipidemia with target LDL less than 100 Does not watch dit and does no exercise. Lab Results  Component Value Date   CHOL 129 05/19/2018   HDL 49 05/19/2018   LDLCALC 64 05/19/2018   TRIG 82 05/19/2018   CHOLHDL 2.6 05/19/2018     4. Seizures ([place]) Is on dilantin and has not had a seizure since her first seizure. She says that was years ago.  5. Vitamin D deficiency Takes a daily vitamin d supplement  6. Morbid obesity ([place]) No recent weight changes. Wt Readings from Last 3 Encounters:  03/03/19 184 lb (83.5 kg)  10/11/18 185 lb 6.4 oz (84.1 kg)  09/23/18 184 lb (83.5 kg)   BMI Readings from Last 3 Encounters:  03/03/19 31.58 kg/m  10/11/18 31.82 kg/m  09/23/18 31.58 kg/m       Outpatient Encounter Medications as of 03/03/2019  Medication Sig  . amLODipine (NORVASC) 5 MG tablet Take 1 tablet (5 mg total) by mouth daily.  Marland Kitchen atorvastatin (LIPITOR) 40 MG tablet Take 1 tablet (40 mg total) by mouth daily.  . Cholecalciferol (VITAMIN D) 2000 units CAPS Take 1 capsule (2,000 Units total) by mouth daily.  Marland Kitchen glucose blood (ONETOUCH VERIO) test strip Test 1x per day and prn  Dx e11.9  . losartan (COZAAR) 100 MG tablet Take 1 tablet (100 mg total) by mouth daily.  . metFORMIN (GLUCOPHAGE XR) 500 MG 24 hr tablet Take 1 tablet (500 mg total) by mouth every evening.  With your evening meal  . Multiple Vitamins-Minerals (CENTRUM SILVER ADULT 50+) TABS Take 1 capsule by mouth.  Glory Rosebush DELICA LANCETS 82N MISC 1 each by Does not apply route daily. Test 1x per day and prn  Dx E11.9  . phenytoin (DILANTIN) 100 MG ER capsule Take 3 capsules (300 mg total) by mouth at bedtime.     Past Surgical History:  Procedure Laterality Date  . ABDOMINAL HYSTERECTOMY    . APPENDECTOMY    . CATARACT EXTRACTION W/PHACO Right 09/26/2016   Procedure: CATARACT EXTRACTION PHACO AND INTRAOCULAR LENS PLACEMENT (IOC);  Surgeon: Baruch Goldmann, MD;  Location: [facility];  Service: Ophthalmology;  Laterality: Right;  CDE: 3.16  . CHOLECYSTECTOMY      Family History  Problem Relation Age of Onset  . Diabetes Mother   . Heart attack Mother 47  . Healthy Father   . Alcohol abuse Father   . Diabetes Sister   . Cancer Sister        breast  . Cirrhosis Brother   . Leukemia Sister   . Diabetes Sister   . Kidney disease Sister        on dialysis    New complaints: None today  Social history: Lives with her boyfriend  Controlled substance contract: n/a  Review of Systems  Constitutional: Negative for activity change and appetite change.  HENT: Negative.   Eyes: Negative for pain.  Respiratory: Negative for shortness of breath.   Cardiovascular: Negative for chest pain, palpitations and leg swelling.  Gastrointestinal: Negative for abdominal pain.  Endocrine: Negative for polydipsia.  Genitourinary: Negative.   Skin: Negative for rash.  Neurological: Negative for dizziness, weakness and headaches.  Hematological: Does not bruise/bleed easily.  Psychiatric/Behavioral: Negative.   All other systems reviewed and are negative.      Objective:   Physical Exam Vitals and nursing note reviewed.  Constitutional:      General: She is not in acute distress.    Appearance: Normal appearance. She is well-developed.  HENT:     Head: Normocephalic.     Nose: Nose  normal.  Eyes:     Pupils: Pupils are equal, round, and reactive to light.  Neck:     Vascular: No carotid bruit or JVD.  Cardiovascular:     Rate and Rhythm: Normal rate and regular rhythm.     Heart sounds: Normal heart sounds.  Pulmonary:     Effort: Pulmonary effort is normal. No respiratory distress.     Breath sounds: Normal breath sounds. No wheezing or rales.  Chest:     Chest wall: No tenderness.  Abdominal:     General: Bowel sounds are normal. There is no distension or abdominal bruit.     Palpations: Abdomen is soft. There is no hepatomegaly, splenomegaly, mass or pulsatile mass.     Tenderness: There is no abdominal tenderness.  Musculoskeletal:        General: Normal range of motion.     Cervical back: Normal range of motion and neck supple.  Lymphadenopathy:     Cervical: No cervical adenopathy.  Skin:    General: Skin is warm and dry.  Neurological:     Mental Status: She is alert and oriented to person, place, and time.     Deep Tendon Reflexes: Reflexes are normal and symmetric.  Psychiatric:        Behavior: Behavior normal.        Thought Content: Thought content normal.        Judgment: Judgment normal.    BP 137/90   Pulse 74   Temp (!) 97.3 F (36.3 C) (Temporal)   Resp 20   Ht '5\' 4"'  (1.626 m)   Wt 184 lb (83.5 kg)   LMP 01/23/2001 (Approximate)   SpO2 94%   BMI 31.58 kg/m   HGBA1c 6.6     Assessment & Plan:  Melanie Maynard comes in today with chief complaint of Medical Management of Chronic Issues   Diagnosis and orders addressed:  1. Type 2 diabetes mellitus treated without insulin (HCC) Continue to watch carbs in diet - hgba1c - Microalbumin / creatinine urine ratio - metFORMIN (GLUCOPHAGE XR) 500 MG 24 hr tablet; Take 1 tablet (500 mg total) by mouth every evening. With your evening meal  Dispense: 90 tablet; Refill: 1  2. Essential hypertension Low sodium diet - CMP14+EGFR - amLODipine (NORVASC) 5 MG tablet; Take 1 tablet (5 mg  total) by mouth daily.  Dispense: 90 tablet; Refill: 1 - losartan (COZAAR) 100 MG tablet; Take 1 tablet (100 mg total) by mouth daily.  Dispense: 90 tablet; Refill: 1  3. Hyperlipidemia with target LDL less than 100 Low fat diet - Lipid panel - atorvastatin (LIPITOR) 40 MG tablet; Take 1 tablet (40 mg total) by mouth  daily.  Dispense: 90 tablet; Refill: 1  4. Seizures (HCC) Continue dilantin - phenytoin (DILANTIN) 100 MG ER capsule; Take 3 capsules (300 mg total) by mouth at bedtime.  Dispense: 270 capsule; Refill: 1  5. Vitamin D deficiency Continue daily vitamin d supplement  6. Morbid obesity ([place]) Discussed diet and exercise for person with BMI >25 Will recheck weight in 3-6 months   Labs pending Health Maintenance reviewed Diet and exercise encouraged  Follow up plan: 3 months   [name] Done, FNP

## 2019-03-03 NOTE — Addendum Note (Signed)
Addended by: Rolena Infante on: 03/03/2019 09:19 AM   Modules accepted: Orders

## 2019-03-03 NOTE — Patient Instructions (Signed)
Diabetes Mellitus and Foot Care Foot care is an important part of your health, especially when you have diabetes. Diabetes may cause you to have problems because of poor blood flow (circulation) to your feet and legs, which can cause your skin to:  Become thinner and drier.  Break more easily.  Heal more slowly.  Peel and crack. You may also have nerve damage (neuropathy) in your legs and feet, causing decreased feeling in them. This means that you may not notice minor injuries to your feet that could lead to more serious problems. Noticing and addressing any potential problems early is the best way to prevent future foot problems. How to care for your feet Foot hygiene  Wash your feet daily with warm water and mild soap. Do not use hot water. Then, pat your feet and the areas between your toes until they are completely dry. Do not soak your feet as this can dry your skin.  Trim your toenails straight across. Do not dig under them or around the cuticle. File the edges of your nails with an emery board or nail file.  Apply a moisturizing lotion or petroleum jelly to the skin on your feet and to dry, brittle toenails. Use lotion that does not contain alcohol and is unscented. Do not apply lotion between your toes. Shoes and socks  Wear clean socks or stockings every day. Make sure they are not too tight. Do not wear knee-high stockings since they may decrease blood flow to your legs.  Wear shoes that fit properly and have enough cushioning. Always look in your shoes before you put them on to be sure there are no objects inside.  To break in new shoes, wear them for just a few hours a day. This prevents injuries on your feet. Wounds, scrapes, corns, and calluses  Check your feet daily for blisters, cuts, bruises, sores, and redness. If you cannot see the bottom of your feet, use a mirror or ask someone for help.  Do not cut corns or calluses or try to remove them with medicine.  If you  find a minor scrape, cut, or break in the skin on your feet, keep it and the skin around it clean and dry. You may clean these areas with mild soap and water. Do not clean the area with peroxide, alcohol, or iodine.  If you have a wound, scrape, corn, or callus on your foot, look at it several times a day to make sure it is healing and not infected. Check for: ? Redness, swelling, or pain. ? Fluid or blood. ? Warmth. ? Pus or a bad smell. General instructions  Do not cross your legs. This may decrease blood flow to your feet.  Do not use heating pads or hot water bottles on your feet. They may burn your skin. If you have lost feeling in your feet or legs, you may not know this is happening until it is too late.  Protect your feet from hot and cold by wearing shoes, such as at the beach or on hot pavement.  Schedule a complete foot exam at least once a year (annually) or more often if you have foot problems. If you have foot problems, report any cuts, sores, or bruises to your health care provider immediately. Contact a health care provider if:  You have a medical condition that increases your risk of infection and you have any cuts, sores, or bruises on your feet.  You have an injury that is not   healing.  You have redness on your legs or feet.  You feel burning or tingling in your legs or feet.  You have pain or cramps in your legs and feet.  Your legs or feet are numb.  Your feet always feel cold.  You have pain around a toenail. Get help right away if:  You have a wound, scrape, corn, or callus on your foot and: ? You have pain, swelling, or redness that gets worse. ? You have fluid or blood coming from the wound, scrape, corn, or callus. ? Your wound, scrape, corn, or callus feels warm to the touch. ? You have pus or a bad smell coming from the wound, scrape, corn, or callus. ? You have a fever. ? You have a red line going up your leg. Summary  Check your feet every day  for cuts, sores, red spots, swelling, and blisters.  Moisturize feet and legs daily.  Wear shoes that fit properly and have enough cushioning.  If you have foot problems, report any cuts, sores, or bruises to your health care provider immediately.  Schedule a complete foot exam at least once a year (annually) or more often if you have foot problems. This information is not intended to replace advice given to you by your health care provider. Make sure you discuss any questions you have with your health care provider. Document Released: 03/07/2000 Document Revised: 04/22/2017 Document Reviewed: 04/11/2016 Elsevier Patient Education  2020 Elsevier Inc.  

## 2019-03-04 LAB — CMP14+EGFR
ALT: 9 IU/L (ref 0–32)
AST: 12 IU/L (ref 0–40)
Albumin/Globulin Ratio: 1.5 (ref 1.2–2.2)
Albumin: 3.9 g/dL (ref 3.8–4.9)
Alkaline Phosphatase: 249 IU/L — ABNORMAL HIGH (ref 39–117)
BUN/Creatinine Ratio: 22 (ref 9–23)
BUN: 20 mg/dL (ref 6–24)
Bilirubin Total: 0.2 mg/dL (ref 0.0–1.2)
CO2: 22 mmol/L (ref 20–29)
Calcium: 8.7 mg/dL (ref 8.7–10.2)
Chloride: 102 mmol/L (ref 96–106)
Creatinine, Ser: 0.9 mg/dL (ref 0.57–1.00)
GFR calc Af Amer: 82 mL/min/{1.73_m2} (ref >59)
GFR calc non Af Amer: 71 mL/min/{1.73_m2} (ref >59)
Globulin, Total: 2.6 g/dL (ref 1.5–4.5)
Glucose: 135 mg/dL — ABNORMAL HIGH (ref 65–99)
Potassium: 4.1 mmol/L (ref 3.5–5.2)
Sodium: 139 mmol/L (ref 134–144)
Total Protein: 6.5 g/dL (ref 6.0–8.5)

## 2019-03-04 LAB — MICROALBUMIN / CREATININE URINE RATIO
Creatinine, Urine: 151.1 mg/dL
Microalb/Creat Ratio: 11 mg/g creat (ref 0–29)
Microalbumin, Urine: 17.2 ug/mL

## 2019-03-04 LAB — LIPID PANEL
Chol/HDL Ratio: 3.1 ratio (ref 0.0–4.4)
Cholesterol, Total: 149 mg/dL (ref 100–199)
HDL: 48 mg/dL (ref >39)
LDL Chol Calc (NIH): 84 mg/dL (ref 0–99)
Triglycerides: 89 mg/dL (ref 0–149)
VLDL Cholesterol Cal: 17 mg/dL (ref 5–40)

## 2019-06-02 ENCOUNTER — Other Ambulatory Visit: Payer: Self-pay

## 2019-06-03 ENCOUNTER — Ambulatory Visit (INDEPENDENT_AMBULATORY_CARE_PROVIDER_SITE_OTHER): Payer: BC Managed Care – PPO

## 2019-06-03 ENCOUNTER — Ambulatory Visit: Payer: BC Managed Care – PPO | Admitting: Nurse Practitioner

## 2019-06-03 ENCOUNTER — Encounter: Payer: Self-pay | Admitting: Nurse Practitioner

## 2019-06-03 VITALS — BP 116/77 | HR 81 | Temp 97.4°F | Ht 64.0 in | Wt 178.2 lb

## 2019-06-03 DIAGNOSIS — I10 Essential (primary) hypertension: Secondary | ICD-10-CM

## 2019-06-03 DIAGNOSIS — R569 Unspecified convulsions: Secondary | ICD-10-CM | POA: Diagnosis not present

## 2019-06-03 DIAGNOSIS — E559 Vitamin D deficiency, unspecified: Secondary | ICD-10-CM

## 2019-06-03 DIAGNOSIS — E119 Type 2 diabetes mellitus without complications: Secondary | ICD-10-CM

## 2019-06-03 DIAGNOSIS — E785 Hyperlipidemia, unspecified: Secondary | ICD-10-CM | POA: Diagnosis not present

## 2019-06-03 LAB — BAYER DCA HB A1C WAIVED: HB A1C (BAYER DCA - WAIVED): 6.2 % (ref <7.0)

## 2019-06-03 MED ORDER — LOSARTAN POTASSIUM 100 MG PO TABS: 100.0000 mg | ORAL_TABLET | Freq: Every day | ORAL | 1 refills | Status: DC

## 2019-06-03 MED ORDER — PHENYTOIN SODIUM EXTENDED 100 MG PO CAPS: 300.0000 mg | ORAL_CAPSULE | Freq: Every day | ORAL | 1 refills | Status: DC

## 2019-06-03 MED ORDER — AMLODIPINE BESYLATE 5 MG PO TABS: 5.0000 mg | ORAL_TABLET | Freq: Every day | ORAL | 1 refills | Status: DC

## 2019-06-03 MED ORDER — ATORVASTATIN CALCIUM 40 MG PO TABS: 40.0000 mg | ORAL_TABLET | Freq: Every day | ORAL | 1 refills | Status: DC

## 2019-06-03 MED ORDER — METFORMIN HCL ER 500 MG PO TB24: 500.0000 mg | ORAL_TABLET | Freq: Every evening | ORAL | 1 refills | Status: DC

## 2019-06-03 NOTE — Patient Instructions (Signed)
Steps to Quit Smoking Smoking tobacco is the leading cause of preventable death. It can affect almost every organ in the body. Smoking puts you and people around you at risk for many serious, long-lasting (chronic) diseases. Quitting smoking can be hard, but it is one of the best things that you can do for your health. It is never too late to quit. How do I get ready to quit? When you decide to quit smoking, make a plan to help you succeed. Before you quit:  Pick a date to quit. Set a date within the next 2 weeks to give you time to prepare.  Write down the reasons why you are quitting. Keep this list in places where you will see it often.  Tell your family, friends, and co-workers that you are quitting. Their support is important.  Talk with your doctor about the choices that may help you quit.  Find out if your health insurance will pay for these treatments.  Know the people, places, things, and activities that make you want to smoke (triggers). Avoid them. What first steps can I take to quit smoking?  Throw away all cigarettes at home, at work, and in your car.  Throw away the things that you use when you smoke, such as ashtrays and lighters.  Clean your car. Make sure to empty the ashtray.  Clean your home, including curtains and carpets. What can I do to help me quit smoking? Talk with your doctor about taking medicines and seeing a counselor at the same time. You are more likely to succeed when you do both.  If you are pregnant or breastfeeding, talk with your doctor about counseling or other ways to quit smoking. Do not take medicine to help you quit smoking unless your doctor tells you to do so. To quit smoking: Quit right away  Quit smoking totally, instead of slowly cutting back on how much you smoke over a period of time.  Go to counseling. You are more likely to quit if you go to counseling sessions regularly. Take medicine You may take medicines to help you quit. Some  medicines need a prescription, and some you can buy over-the-counter. Some medicines may contain a drug called nicotine to replace the nicotine in cigarettes. Medicines may:  Help you to stop having the desire to smoke (cravings).  Help to stop the problems that come when you stop smoking (withdrawal symptoms). Your doctor may ask you to use:  Nicotine patches, gum, or lozenges.  Nicotine inhalers or sprays.  Non-nicotine medicine that is taken by mouth. Find resources Find resources and other ways to help you quit smoking and remain smoke-free after you quit. These resources are most helpful when you use them often. They include:  Online chats with a counselor.  Phone quitlines.  Printed self-help materials.  Support groups or group counseling.  Text messaging programs.  Mobile phone apps. Use apps on your mobile phone or tablet that can help you stick to your quit plan. There are many free apps for mobile phones and tablets as well as websites. Examples include Quit Guide from the CDC and smokefree.gov  What things can I do to make it easier to quit?   Talk to your family and friends. Ask them to support and encourage you.  Call a phone quitline (1-800-QUIT-NOW), reach out to support groups, or work with a counselor.  Ask people who smoke to not smoke around you.  Avoid places that make you want to smoke,   such as: ? Bars. ? Parties. ? Smoke-break areas at work.  Spend time with people who do not smoke.  Lower the stress in your life. Stress can make you want to smoke. Try these things to help your stress: ? Getting regular exercise. ? Doing deep-breathing exercises. ? Doing yoga. ? Meditating. ? Doing a body scan. To do this, close your eyes, focus on one area of your body at a time from head to toe. Notice which parts of your body are tense. Try to relax the muscles in those areas. How will I feel when I quit smoking? Day 1 to 3 weeks Within the first 24 hours,  you may start to have some problems that come from quitting tobacco. These problems are very bad 2-3 days after you quit, but they do not often last for more than 2-3 weeks. You may get these symptoms:  Mood swings.  Feeling restless, nervous, angry, or annoyed.  Trouble concentrating.  Dizziness.  Strong desire for high-sugar foods and nicotine.  Weight gain.  Trouble pooping (constipation).  Feeling like you may vomit (nausea).  Coughing or a sore throat.  Changes in how the medicines that you take for other issues work in your body.  Depression.  Trouble sleeping (insomnia). Week 3 and afterward After the first 2-3 weeks of quitting, you may start to notice more positive results, such as:  Better sense of smell and taste.  Less coughing and sore throat.  Slower heart rate.  Lower blood pressure.  Clearer skin.  Better breathing.  Fewer sick days. Quitting smoking can be hard. Do not give up if you fail the first time. Some people need to try a few times before they succeed. Do your best to stick to your quit plan, and talk with your doctor if you have any questions or concerns. Summary  Smoking tobacco is the leading cause of preventable death. Quitting smoking can be hard, but it is one of the best things that you can do for your health.  When you decide to quit smoking, make a plan to help you succeed.  Quit smoking right away, not slowly over a period of time.  When you start quitting, seek help from your doctor, family, or friends. This information is not intended to replace advice given to you by your health care provider. Make sure you discuss any questions you have with your health care provider. Document Revised: 12/03/2018 Document Reviewed: 05/29/2018 Elsevier Patient Education  2020 Elsevier Inc.  

## 2019-06-03 NOTE — Progress Notes (Signed)
Subjective:    Patient ID: Melanie Maynard, female    DOB: 10-05-1960, 59 y.o.   MRN: [id number]   Chief Complaint: Medical Management of Chronic Issues (3 month )    HPI:  1. Essential hypertension Does not check BP at home. Denies chest pain, SOB, headaches. Is watching salt intake at home. BP Readings from Last 3 Encounters:  03/03/19 137/90  10/11/18 (!) 151/95  09/23/18 128/87     2. Type 2 diabetes mellitus treated without insulin ([place]) Does not check blood sugars at home. Is not watching carb or sugar intake. Denies numbness or tingling in feet. Last eye exam was in November 2020. Denies symptoms of hypoglycemia. Lab Results  Component Value Date   HGBA1C 6.6 03/03/2019     3. Hyperlipidemia with target LDL less than 100 Does not watch fat or cholesterol intake. Does not exercise at home. Lab Results  Component Value Date   CHOL 149 03/03/2019   HDL 48 03/03/2019   LDLCALC 84 03/03/2019   TRIG 89 03/03/2019   CHOLHDL 3.1 03/03/2019     4. Seizures ([place]) Is still taking dilantin for seizures and has not had any seizure activity.  5. Morbid obesity ([place]) Weight is down 6lbs since  Last visit BMI Readings from Last 3 Encounters:  03/03/19 31.58 kg/m  10/11/18 31.82 kg/m  09/23/18 31.58 kg/m   Wt Readings from Last 3 Encounters:  06/03/19 178 lb 3.2 oz (80.8 kg)  03/03/19 184 lb (83.5 kg)  10/11/18 185 lb 6.4 oz (84.1 kg)     6. Vitamin D deficiency Is not taking vitamin D supplement at this time. Last Vitamin D 16.5 in May 2017.     Outpatient Encounter Medications as of 06/03/2019  Medication Sig  . amLODipine (NORVASC) 5 MG tablet Take 1 tablet (5 mg total) by mouth daily.  Marland Kitchen atorvastatin (LIPITOR) 40 MG tablet Take 1 tablet (40 mg total) by mouth daily.  . Cholecalciferol (VITAMIN D) 2000 units CAPS Take 1 capsule (2,000 Units total) by mouth daily.  Marland Kitchen glucose blood (ONETOUCH VERIO) test strip Test 1x per day and prn  Dx e11.9  . losartan  (COZAAR) 100 MG tablet Take 1 tablet (100 mg total) by mouth daily.  . metFORMIN (GLUCOPHAGE XR) 500 MG 24 hr tablet Take 1 tablet (500 mg total) by mouth every evening. With your evening meal  . ONETOUCH DELICA LANCETS 07M MISC 1 each by Does not apply route daily. Test 1x per day and prn  Dx E11.9  . phenytoin (DILANTIN) 100 MG ER capsule Take 3 capsules (300 mg total) by mouth at bedtime.  . [DISCONTINUED] Multiple Vitamins-Minerals (CENTRUM SILVER ADULT 50+) TABS Take 1 capsule by mouth.   No facility-administered encounter medications on file as of 06/03/2019.    Past Surgical History:  Procedure Laterality Date  . ABDOMINAL HYSTERECTOMY    . APPENDECTOMY    . CATARACT EXTRACTION W/PHACO Right 09/26/2016   Procedure: CATARACT EXTRACTION PHACO AND INTRAOCULAR LENS PLACEMENT (IOC);  Surgeon: Baruch Goldmann, MD;  Location: [facility];  Service: Ophthalmology;  Laterality: Right;  CDE: 3.16  . CHOLECYSTECTOMY      Family History  Problem Relation Age of Onset  . Diabetes Mother   . Heart attack Mother 17  . Healthy Father   . Alcohol abuse Father   . Diabetes Sister   . Cancer Sister        breast  . Cirrhosis Brother   . Leukemia Sister   .  Diabetes Sister   . Kidney disease Sister        on dialysis    New complaints: None.  Social history: Boyfriend lives with her.   Controlled substance contract: N/A    Review of Systems  Constitutional: Negative.   HENT: Negative.   Eyes: Negative.   Respiratory: Negative.   Cardiovascular: Negative.   Gastrointestinal: Negative.   Endocrine: Negative.   Genitourinary: Negative.   Musculoskeletal: Negative.   Skin: Negative.   Allergic/Immunologic: Negative.   Neurological: Negative.   Hematological: Negative.   Psychiatric/Behavioral: Negative.        Objective:   Physical Exam Vitals and nursing note reviewed.  Constitutional:      Appearance: Normal appearance.  HENT:     Head: Normocephalic.     Right Ear:  Tympanic membrane normal.     Left Ear: Tympanic membrane normal.     Nose: Nose normal.     Mouth/Throat:     Mouth: Mucous membranes are moist.     Pharynx: Oropharynx is clear.  Eyes:     Conjunctiva/sclera: Conjunctivae normal.     Pupils: Pupils are equal, round, and reactive to light.  Cardiovascular:     Rate and Rhythm: Normal rate and regular rhythm.     Pulses: Normal pulses.     Heart sounds: Normal heart sounds.  Pulmonary:     Effort: Pulmonary effort is normal.     Breath sounds: Normal breath sounds.  Abdominal:     General: Bowel sounds are normal.     Palpations: Abdomen is soft.  Musculoskeletal:        General: Normal range of motion.     Cervical back: Normal range of motion and neck supple.  Skin:    General: Skin is warm and dry.     Capillary Refill: Capillary refill takes less than 2 seconds.  Neurological:     General: No focal deficit present.     Mental Status: She is alert and oriented to person, place, and time.  Psychiatric:        Mood and Affect: Mood normal.        Behavior: Behavior normal.    BP 116/77   Pulse 81   Temp (!) 97.4 F (36.3 C) (Temporal)   Ht 5' 4" (1.626 m)   Wt 178 lb 3.2 oz (80.8 kg)   LMP 01/23/2001 (Approximate)   SpO2 98%   BMI 30.59 kg/m    HGBA1c 6.2  EKG- NSR-[name] Done, FNP  Chest xray- mild bronchitic chnanges-Preliminary reading by Ronnald Collum, FNP  [facility]     Assessment & Plan:   BRILYN TULLER comes in today with chief complaint of Medical Management of Chronic Issues (3 month )   Diagnosis and orders addressed:  1. Essential hypertension Low sodium diet. - CMP14+EGFR - CBC with Differential/Platelet  2. Type 2 diabetes mellitus treated without insulin ([facility]) Count carbs and sweets in diet. Check blood sugars daily. - Bayer DCA Hb A1c Waived  3. Hyperlipidemia with target LDL less than 100 Low fat/low cholesterol diet. Weight-bearing exercise as tolerated. - Lipid panel  4.  Seizures ([facility]) Continue dilantin.   5. Morbid obesity ([place]) Discussed diet and exercise for person with BMI >25 Will recheck weight in 3-6 months   6. Vitamin D deficiency Continue vitamin D.   Labs pending Health Maintenance reviewed Diet and exercise encouraged  Follow up plan: Return in 3 months.   [name] Done, FNP

## 2019-06-04 LAB — CMP14+EGFR
ALT: 6 IU/L (ref 0–32)
AST: 14 IU/L (ref 0–40)
Albumin/Globulin Ratio: 1.6 (ref 1.2–2.2)
Albumin: 3.9 g/dL (ref 3.8–4.9)
Alkaline Phosphatase: 236 IU/L — ABNORMAL HIGH (ref 39–117)
BUN/Creatinine Ratio: 17 (ref 9–23)
BUN: 14 mg/dL (ref 6–24)
Bilirubin Total: <0.2 mg/dL (ref 0.0–1.2)
CO2: 21 mmol/L (ref 20–29)
Calcium: 8.7 mg/dL (ref 8.7–10.2)
Chloride: 101 mmol/L (ref 96–106)
Creatinine, Ser: 0.83 mg/dL (ref 0.57–1.00)
GFR calc Af Amer: 90 mL/min/{1.73_m2} (ref >59)
GFR calc non Af Amer: 78 mL/min/{1.73_m2} (ref >59)
Globulin, Total: 2.4 g/dL (ref 1.5–4.5)
Glucose: 168 mg/dL — ABNORMAL HIGH (ref 65–99)
Potassium: 4 mmol/L (ref 3.5–5.2)
Sodium: 139 mmol/L (ref 134–144)
Total Protein: 6.3 g/dL (ref 6.0–8.5)

## 2019-06-04 LAB — CBC WITH DIFFERENTIAL/PLATELET
Basophils Absolute: 0.1 10*3/uL (ref 0.0–0.2)
Basos: 1 %
EOS (ABSOLUTE): 0.4 10*3/uL (ref 0.0–0.4)
Eos: 4 %
Hematocrit: 40.8 % (ref 34.0–46.6)
Hemoglobin: 13.5 g/dL (ref 11.1–15.9)
Immature Grans (Abs): 0 10*3/uL (ref 0.0–0.1)
Immature Granulocytes: 0 %
Lymphocytes Absolute: 3.2 10*3/uL — ABNORMAL HIGH (ref 0.7–3.1)
Lymphs: 32 %
MCH: 26.7 pg (ref 26.6–33.0)
MCHC: 33.1 g/dL (ref 31.5–35.7)
MCV: 81 fL (ref 79–97)
Monocytes Absolute: 0.5 10*3/uL (ref 0.1–0.9)
Monocytes: 5 %
Neutrophils Absolute: 5.7 10*3/uL (ref 1.4–7.0)
Neutrophils: 58 %
Platelets: 245 10*3/uL (ref 150–450)
RBC: 5.06 x10E6/uL (ref 3.77–5.28)
RDW: 14.1 % (ref 11.7–15.4)
WBC: 9.8 10*3/uL (ref 3.4–10.8)

## 2019-06-04 LAB — LIPID PANEL
Chol/HDL Ratio: 3.1 ratio (ref 0.0–4.4)
Cholesterol, Total: 153 mg/dL (ref 100–199)
HDL: 49 mg/dL (ref >39)
LDL Chol Calc (NIH): 85 mg/dL (ref 0–99)
Triglycerides: 107 mg/dL (ref 0–149)
VLDL Cholesterol Cal: 19 mg/dL (ref 5–40)

## 2019-06-16 DIAGNOSIS — Z23 Encounter for immunization: Secondary | ICD-10-CM | POA: Diagnosis not present

## 2019-07-09 DIAGNOSIS — Z23 Encounter for immunization: Secondary | ICD-10-CM | POA: Diagnosis not present

## 2019-09-07 ENCOUNTER — Ambulatory Visit: Payer: Self-pay | Admitting: Nurse Practitioner

## 2019-09-19 ENCOUNTER — Other Ambulatory Visit: Payer: Self-pay

## 2019-09-19 ENCOUNTER — Encounter: Payer: Self-pay | Admitting: Nurse Practitioner

## 2019-09-19 ENCOUNTER — Ambulatory Visit: Payer: BC Managed Care – PPO | Admitting: Nurse Practitioner

## 2019-09-19 VITALS — BP 125/86 | HR 81 | Temp 97.8°F | Resp 20 | Ht 64.0 in | Wt 175.0 lb

## 2019-09-19 DIAGNOSIS — I10 Essential (primary) hypertension: Secondary | ICD-10-CM | POA: Diagnosis not present

## 2019-09-19 DIAGNOSIS — E785 Hyperlipidemia, unspecified: Secondary | ICD-10-CM | POA: Diagnosis not present

## 2019-09-19 DIAGNOSIS — M792 Neuralgia and neuritis, unspecified: Secondary | ICD-10-CM

## 2019-09-19 DIAGNOSIS — E119 Type 2 diabetes mellitus without complications: Secondary | ICD-10-CM | POA: Diagnosis not present

## 2019-09-19 DIAGNOSIS — R569 Unspecified convulsions: Secondary | ICD-10-CM

## 2019-09-19 DIAGNOSIS — Z6830 Body mass index (BMI) 30.0-30.9, adult: Secondary | ICD-10-CM

## 2019-09-19 DIAGNOSIS — E559 Vitamin D deficiency, unspecified: Secondary | ICD-10-CM

## 2019-09-19 LAB — BAYER DCA HB A1C WAIVED: HB A1C (BAYER DCA - WAIVED): 6.2 % (ref <7.0)

## 2019-09-19 MED ORDER — AMLODIPINE BESYLATE 5 MG PO TABS: 5.0000 mg | ORAL_TABLET | Freq: Every day | ORAL | 1 refills | Status: DC

## 2019-09-19 MED ORDER — ATORVASTATIN CALCIUM 40 MG PO TABS: 40.0000 mg | ORAL_TABLET | Freq: Every day | ORAL | 1 refills | Status: DC

## 2019-09-19 MED ORDER — LOSARTAN POTASSIUM 100 MG PO TABS: 100.0000 mg | ORAL_TABLET | Freq: Every day | ORAL | 1 refills | Status: DC

## 2019-09-19 MED ORDER — PHENYTOIN SODIUM EXTENDED 100 MG PO CAPS: 300.0000 mg | ORAL_CAPSULE | Freq: Every day | ORAL | 1 refills | Status: DC

## 2019-09-19 MED ORDER — METFORMIN HCL ER 500 MG PO TB24: 500.0000 mg | ORAL_TABLET | Freq: Every evening | ORAL | 1 refills | Status: DC

## 2019-09-19 NOTE — Progress Notes (Signed)
Subjective:    Patient ID: Melanie Maynard, female    DOB: 1960/03/26, 59 y.o.   MRN: [id number]   Chief Complaint: Medical Management of Chronic Issues    HPI:  1. Type 2 diabetes mellitus treated without insulin ([place]) Patient does not check blood sugars at home. She does not watch diet at all. Lab Results  Component Value Date   HGBA1C 6.2 06/03/2019     2. Hyperlipidemia with target LDL less than 100 Eats whatever she wants to. Walks a lot at work but does no dedicated exercise. Lab Results  Component Value Date   CHOL 153 06/03/2019   HDL 49 06/03/2019   LDLCALC 85 06/03/2019   TRIG 107 06/03/2019   CHOLHDL 3.1 06/03/2019     3. Essential hypertension denies chest pain, sob or headaches. Does not check blood pressure at home. BP Readings from Last 3 Encounters:  09/19/19 125/86  06/03/19 116/77  03/03/19 137/90    4. Neuralgia of right lower extremity Has some numbnes that comes and goes with some burning o both feet.  5. Seizures ([place]) Is on dilantin. Has not had a seizure in over 20 years  6. Vitamin D deficiency Does not take vitamin d supplement  7. Morbid obesity ([place]) No recent weight changes Wt Readings from Last 3 Encounters:  09/19/19 175 lb (79.4 kg)  06/03/19 178 lb 3.2 oz (80.8 kg)  03/03/19 184 lb (83.5 kg)   BMI Readings from Last 3 Encounters:  09/19/19 30.04 kg/m  06/03/19 30.59 kg/m  03/03/19 31.58 kg/m       Outpatient Encounter Medications as of 09/19/2019  Medication Sig  . amLODipine (NORVASC) 5 MG tablet Take 1 tablet (5 mg total) by mouth daily.  Marland Kitchen atorvastatin (LIPITOR) 40 MG tablet Take 1 tablet (40 mg total) by mouth daily.  . Cholecalciferol (VITAMIN D) 2000 units CAPS Take 1 capsule (2,000 Units total) by mouth daily.  Marland Kitchen glucose blood (ONETOUCH VERIO) test strip Test 1x per day and prn  Dx e11.9  . losartan (COZAAR) 100 MG tablet Take 1 tablet (100 mg total) by mouth daily.  . metFORMIN (GLUCOPHAGE XR) 500 MG 24 hr  tablet Take 1 tablet (500 mg total) by mouth every evening. With your evening meal  . ONETOUCH DELICA LANCETS 03X MISC 1 each by Does not apply route daily. Test 1x per day and prn  Dx E11.9  . phenytoin (DILANTIN) 100 MG ER capsule Take 3 capsules (300 mg total) by mouth at bedtime.     Past Surgical History:  Procedure Laterality Date  . ABDOMINAL HYSTERECTOMY    . APPENDECTOMY    . CATARACT EXTRACTION W/PHACO Right 09/26/2016   Procedure: CATARACT EXTRACTION PHACO AND INTRAOCULAR LENS PLACEMENT (IOC);  Surgeon: Baruch Goldmann, MD;  Location: [facility];  Service: Ophthalmology;  Laterality: Right;  CDE: 3.16  . CHOLECYSTECTOMY      Family History  Problem Relation Age of Onset  . Diabetes Mother   . Heart attack Mother 89  . Healthy Father   . Alcohol abuse Father   . Diabetes Sister   . Cancer Sister        breast  . Cirrhosis Brother   . Leukemia Sister   . Diabetes Sister   . Kidney disease Sister        on dialysis    New complaints: None today  Social history: Lives with her boyfriend  Controlled substance contract: n/a    Review of Systems  Constitutional: Negative for diaphoresis.  Eyes: Negative for pain.  Respiratory: Negative for shortness of breath.   Cardiovascular: Negative for chest pain, palpitations and leg swelling.  Gastrointestinal: Negative for abdominal pain.  Endocrine: Negative for polydipsia.  Skin: Negative for rash.  Neurological: Negative for dizziness, weakness and headaches.  Hematological: Does not bruise/bleed easily.  All other systems reviewed and are negative.      Objective:   Physical Exam Vitals and nursing note reviewed.  Constitutional:      General: She is not in acute distress.    Appearance: Normal appearance. She is well-developed.  HENT:     Head: Normocephalic.     Nose: Nose normal.  Eyes:     Pupils: Pupils are equal, round, and reactive to light.  Neck:     Vascular: No carotid bruit or JVD.    Cardiovascular:     Rate and Rhythm: Normal rate and regular rhythm.     Heart sounds: Normal heart sounds.  Pulmonary:     Effort: Pulmonary effort is normal. No respiratory distress.     Breath sounds: Normal breath sounds. No wheezing or rales.  Chest:     Chest wall: No tenderness.  Abdominal:     General: Bowel sounds are normal. There is no distension or abdominal bruit.     Palpations: Abdomen is soft. There is no hepatomegaly, splenomegaly, mass or pulsatile mass.     Tenderness: There is no abdominal tenderness.  Musculoskeletal:        General: Normal range of motion.     Cervical back: Normal range of motion and neck supple.  Lymphadenopathy:     Cervical: No cervical adenopathy.  Skin:    General: Skin is warm and dry.  Neurological:     Mental Status: She is alert and oriented to person, place, and time.     Deep Tendon Reflexes: Reflexes are normal and symmetric.  Psychiatric:        Behavior: Behavior normal.        Thought Content: Thought content normal.        Judgment: Judgment normal.    BP 125/86   Pulse 81   Temp 97.8 F (36.6 C) (Temporal)   Resp 20   Ht '5\' 4"'  (1.626 m)   Wt 175 lb (79.4 kg)   LMP 01/23/2001 (Approximate)   SpO2 98%   BMI 30.04 kg/m   hgba1c 6.2%      Assessment & Plan:  Melanie Maynard comes in today with chief complaint of Medical Management of Chronic Issues   Diagnosis and orders addressed:  1. Type 2 diabetes mellitus treated without insulin ([facility]) Watch carbs in diet Referral to podiatry - Bayer DCA Hb A1c Waived - metFORMIN (GLUCOPHAGE XR) 500 MG 24 hr tablet; Take 1 tablet (500 mg total) by mouth every evening. With your evening meal  Dispense: 90 tablet; Refill: 1  2. Hyperlipidemia with target LDL less than 100 Low fat diet - Lipid panel - atorvastatin (LIPITOR) 40 MG tablet; Take 1 tablet (40 mg total) by mouth daily.  Dispense: 90 tablet; Refill: 1  3. Essential hypertension Low sodium diet - CBC with  Differential/Platelet - CMP14+EGFR - amLODipine (NORVASC) 5 MG tablet; Take 1 tablet (5 mg total) by mouth daily.  Dispense: 90 tablet; Refill: 1 - losartan (COZAAR) 100 MG tablet; Take 1 tablet (100 mg total) by mouth daily.  Dispense: 90 tablet; Refill: 1  4. Neuralgia of right lower extremity  5.  Seizures ([facility]) Continue dilantin - phenytoin (DILANTIN) 100 MG ER capsule; Take 3 capsules (300 mg total) by mouth at bedtime.  Dispense: 270 capsule; Refill: 1  6. Vitamin D deficiency Encouraged daily vitamin d supplement  7. BMI 30.0-30.9,adult Discussed diet and exercise for person with BMI >25 Will recheck weight in 3-6 months    Labs pending Health Maintenance reviewed Diet and exercise encouraged  Follow up plan: 6 months   [name] Done, FNP

## 2019-09-19 NOTE — Patient Instructions (Signed)
Diabetes Mellitus and Foot Care Foot care is an important part of your health, especially when you have diabetes. Diabetes may cause you to have problems because of poor blood flow (circulation) to your feet and legs, which can cause your skin to:  Become thinner and drier.  Break more easily.  Heal more slowly.  Peel and crack. You may also have nerve damage (neuropathy) in your legs and feet, causing decreased feeling in them. This means that you may not notice minor injuries to your feet that could lead to more serious problems. Noticing and addressing any potential problems early is the best way to prevent future foot problems. How to care for your feet Foot hygiene  Wash your feet daily with warm water and mild soap. Do not use hot water. Then, pat your feet and the areas between your toes until they are completely dry. Do not soak your feet as this can dry your skin.  Trim your toenails straight across. Do not dig under them or around the cuticle. File the edges of your nails with an emery board or nail file.  Apply a moisturizing lotion or petroleum jelly to the skin on your feet and to dry, brittle toenails. Use lotion that does not contain alcohol and is unscented. Do not apply lotion between your toes. Shoes and socks  Wear clean socks or stockings every day. Make sure they are not too tight. Do not wear knee-high stockings since they may decrease blood flow to your legs.  Wear shoes that fit properly and have enough cushioning. Always look in your shoes before you put them on to be sure there are no objects inside.  To break in new shoes, wear them for just a few hours a day. This prevents injuries on your feet. Wounds, scrapes, corns, and calluses  Check your feet daily for blisters, cuts, bruises, sores, and redness. If you cannot see the bottom of your feet, use a mirror or ask someone for help.  Do not cut corns or calluses or try to remove them with medicine.  If you  find a minor scrape, cut, or break in the skin on your feet, keep it and the skin around it clean and dry. You may clean these areas with mild soap and water. Do not clean the area with peroxide, alcohol, or iodine.  If you have a wound, scrape, corn, or callus on your foot, look at it several times a day to make sure it is healing and not infected. Check for: ? Redness, swelling, or pain. ? Fluid or blood. ? Warmth. ? Pus or a bad smell. General instructions  Do not cross your legs. This may decrease blood flow to your feet.  Do not use heating pads or hot water bottles on your feet. They may burn your skin. If you have lost feeling in your feet or legs, you may not know this is happening until it is too late.  Protect your feet from hot and cold by wearing shoes, such as at the beach or on hot pavement.  Schedule a complete foot exam at least once a year (annually) or more often if you have foot problems. If you have foot problems, report any cuts, sores, or bruises to your health care provider immediately. Contact a health care provider if:  You have a medical condition that increases your risk of infection and you have any cuts, sores, or bruises on your feet.  You have an injury that is not   healing.  You have redness on your legs or feet.  You feel burning or tingling in your legs or feet.  You have pain or cramps in your legs and feet.  Your legs or feet are numb.  Your feet always feel cold.  You have pain around a toenail. Get help right away if:  You have a wound, scrape, corn, or callus on your foot and: ? You have pain, swelling, or redness that gets worse. ? You have fluid or blood coming from the wound, scrape, corn, or callus. ? Your wound, scrape, corn, or callus feels warm to the touch. ? You have pus or a bad smell coming from the wound, scrape, corn, or callus. ? You have a fever. ? You have a red line going up your leg. Summary  Check your feet every day  for cuts, sores, red spots, swelling, and blisters.  Moisturize feet and legs daily.  Wear shoes that fit properly and have enough cushioning.  If you have foot problems, report any cuts, sores, or bruises to your health care provider immediately.  Schedule a complete foot exam at least once a year (annually) or more often if you have foot problems. This information is not intended to replace advice given to you by your health care provider. Make sure you discuss any questions you have with your health care provider. Document Revised: 12/01/2018 Document Reviewed: 04/11/2016 Elsevier Patient Education  2020 [employer]  

## 2019-09-20 LAB — CMP14+EGFR
ALT: 9 IU/L (ref 0–32)
AST: 12 IU/L (ref 0–40)
Albumin/Globulin Ratio: 1.4 (ref 1.2–2.2)
Albumin: 3.9 g/dL (ref 3.8–4.9)
Alkaline Phosphatase: 220 IU/L — ABNORMAL HIGH (ref 48–121)
BUN/Creatinine Ratio: 17 (ref 9–23)
BUN: 15 mg/dL (ref 6–24)
Bilirubin Total: 0.2 mg/dL (ref 0.0–1.2)
CO2: 29 mmol/L (ref 20–29)
Calcium: 8.9 mg/dL (ref 8.7–10.2)
Chloride: 105 mmol/L (ref 96–106)
Creatinine, Ser: 0.88 mg/dL (ref 0.57–1.00)
GFR calc Af Amer: 83 mL/min/{1.73_m2} (ref >59)
GFR calc non Af Amer: 72 mL/min/{1.73_m2} (ref >59)
Globulin, Total: 2.7 g/dL (ref 1.5–4.5)
Glucose: 151 mg/dL — ABNORMAL HIGH (ref 65–99)
Potassium: 4 mmol/L (ref 3.5–5.2)
Sodium: 145 mmol/L — ABNORMAL HIGH (ref 134–144)
Total Protein: 6.6 g/dL (ref 6.0–8.5)

## 2019-09-20 LAB — CBC WITH DIFFERENTIAL/PLATELET
Basophils Absolute: 0.1 10*3/uL (ref 0.0–0.2)
Basos: 1 %
EOS (ABSOLUTE): 0.4 10*3/uL (ref 0.0–0.4)
Eos: 4 %
Hematocrit: 38.8 % (ref 34.0–46.6)
Hemoglobin: 13.4 g/dL (ref 11.1–15.9)
Immature Grans (Abs): 0 10*3/uL (ref 0.0–0.1)
Immature Granulocytes: 0 %
Lymphocytes Absolute: 3 10*3/uL (ref 0.7–3.1)
Lymphs: 33 %
MCH: 27.4 pg (ref 26.6–33.0)
MCHC: 34.5 g/dL (ref 31.5–35.7)
MCV: 79 fL (ref 79–97)
Monocytes Absolute: 0.5 10*3/uL (ref 0.1–0.9)
Monocytes: 5 %
Neutrophils Absolute: 5.1 10*3/uL (ref 1.4–7.0)
Neutrophils: 57 %
Platelets: 255 10*3/uL (ref 150–450)
RBC: 4.89 x10E6/uL (ref 3.77–5.28)
RDW: 14.3 % (ref 11.7–15.4)
WBC: 9 10*3/uL (ref 3.4–10.8)

## 2019-09-20 LAB — LIPID PANEL
Chol/HDL Ratio: 2.4 ratio (ref 0.0–4.4)
Cholesterol, Total: 133 mg/dL (ref 100–199)
HDL: 55 mg/dL (ref >39)
LDL Chol Calc (NIH): 65 mg/dL (ref 0–99)
Triglycerides: 65 mg/dL (ref 0–149)
VLDL Cholesterol Cal: 13 mg/dL (ref 5–40)

## 2019-09-22 ENCOUNTER — Telehealth: Payer: Self-pay | Admitting: Nurse Practitioner

## 2019-10-06 DIAGNOSIS — E1142 Type 2 diabetes mellitus with diabetic polyneuropathy: Secondary | ICD-10-CM | POA: Diagnosis not present

## 2019-10-06 DIAGNOSIS — M79676 Pain in unspecified toe(s): Secondary | ICD-10-CM | POA: Diagnosis not present

## 2019-10-06 DIAGNOSIS — B351 Tinea unguium: Secondary | ICD-10-CM | POA: Diagnosis not present

## 2019-10-12 ENCOUNTER — Other Ambulatory Visit: Payer: Self-pay

## 2019-10-12 ENCOUNTER — Encounter: Payer: Self-pay | Admitting: Neurology

## 2019-10-12 ENCOUNTER — Ambulatory Visit: Payer: BC Managed Care – PPO | Admitting: Neurology

## 2019-10-12 DIAGNOSIS — R569 Unspecified convulsions: Secondary | ICD-10-CM | POA: Diagnosis not present

## 2019-10-12 MED ORDER — PHENYTOIN SODIUM EXTENDED 100 MG PO CAPS: 300.0000 mg | ORAL_CAPSULE | Freq: Every day | ORAL | 3 refills | Status: DC

## 2019-10-12 NOTE — Patient Instructions (Signed)
Continue current medications Check dilantin level today  Call for seizure Follow-up as needed  Continue to see primary doctor routinely

## 2019-10-12 NOTE — Progress Notes (Signed)
I have read the note, and I agree with the clinical assessment and plan.  Matthe Sloane K Darcus Edds   

## 2019-10-12 NOTE — Progress Notes (Signed)
PATIENT: Melanie Maynard DOB: [birth year]  REASON FOR VISIT: follow up HISTORY FROM: patient  HISTORY OF PRESENT ILLNESS: Today 10/12/19 Melanie Maynard is a 59 year old female with history of seizure disorder.  She remains on Dilantin.  In the past she has tried to decrease her dose of Dilantin, resulted in seizures.  No seizures in over 20 years.  Indicates overall health has been good, has routine follow-up with PCP.  She is employed full-time as a Glass blower/designer.  Denies any new problems or concerns.  Is tolerating Dilantin.  Presents today for evaluation unaccompanied.   HISTORY 10/11/2018 SS: Melanie Maynard is a 59 year old female with history of seizure disorder. She remains on Dilantin and is tolerating well without side effect. She has not had recurrent seizure.  She indicates her overall health has been good since last visit.  She does have history of diabetes, her last A1c was 6.4.  She is employed full-time, as a Glass blower/designer.  She drives a car without difficulty.  She denies any new problems or concerns.  She presents today for follow-up unaccompanied.  She is requesting a refill of Dilantin for a year supply.  She says in the past they have tried to decrease her dose of Dilantin, but it caused her to have small seizures.  She has not had a seizure in over 20 years.   REVIEW OF SYSTEMS: Out of a complete 14 system review of symptoms, the patient complains only of the following symptoms, and all other reviewed systems are negative.  Seizure  ALLERGIES: Allergies  Allergen Reactions  . Gabapentin Other (See Comments)    Chest pain    HOME MEDICATIONS: Outpatient Medications Prior to Visit  Medication Sig Dispense Refill  . amLODipine (NORVASC) 5 MG tablet Take 1 tablet (5 mg total) by mouth daily. 90 tablet 1  . atorvastatin (LIPITOR) 40 MG tablet Take 1 tablet (40 mg total) by mouth daily. 90 tablet 1  . Cholecalciferol (VITAMIN D) 2000 units CAPS Take 1 capsule (2,000 Units total)  by mouth daily. 100 capsule 3  . glucose blood (ONETOUCH VERIO) test strip Test 1x per day and prn  Dx e11.9 100 each 12  . losartan (COZAAR) 100 MG tablet Take 1 tablet (100 mg total) by mouth daily. 90 tablet 1  . metFORMIN (GLUCOPHAGE XR) 500 MG 24 hr tablet Take 1 tablet (500 mg total) by mouth every evening. With your evening meal 90 tablet 1  . ONETOUCH DELICA LANCETS 86P MISC 1 each by Does not apply route daily. Test 1x per day and prn  Dx E11.9 100 each 3  . phenytoin (DILANTIN) 100 MG ER capsule Take 3 capsules (300 mg total) by mouth at bedtime. 270 capsule 1   No facility-administered medications prior to visit.    PAST MEDICAL HISTORY: Past Medical History:  Diagnosis Date  . Diabetes mellitus without complication ([place])   . Hypertension   . Seizures ([place])     PAST SURGICAL HISTORY: Past Surgical History:  Procedure Laterality Date  . ABDOMINAL HYSTERECTOMY    . APPENDECTOMY    . CATARACT EXTRACTION W/PHACO Right 09/26/2016   Procedure: CATARACT EXTRACTION PHACO AND INTRAOCULAR LENS PLACEMENT (IOC);  Surgeon: Baruch Goldmann, MD;  Location: [facility];  Service: Ophthalmology;  Laterality: Right;  CDE: 3.16  . CHOLECYSTECTOMY      FAMILY HISTORY: Family History  Problem Relation Age of Onset  . Diabetes Mother   . Heart attack Mother 8  . Healthy  Father   . Alcohol abuse Father   . Diabetes Sister   . Cancer Sister        breast  . Cirrhosis Brother   . Leukemia Sister   . Diabetes Sister   . Kidney disease Sister        on dialysis    SOCIAL HISTORY: Social History   Socioeconomic History  . Marital status: Significant Other    Spouse name: Not on file  . Number of children: 0  . Years of education: 74  . Highest education level: Not on file  Occupational History    Employer: [employer]  Tobacco Use  . Smoking status: Current Every Day Smoker    Packs/day: 1.00    Types: Cigarettes  . Smokeless tobacco: Never Used  Vaping Use  . Vaping Use:  Never used  Substance and Sexual Activity  . Alcohol use: No    Alcohol/week: 0.0 standard drinks  . Drug use: No  . Sexual activity: Not on file  Other Topics Concern  . Not on file  Social History Narrative   Patient is single with no children   Patient is right handed   Patient has a high school education   Patient drinks 2-3 cups daily   Social Determinants of Health   Financial Resource Strain:   . Difficulty of Paying Living Expenses:   Food Insecurity:   . Worried About Charity fundraiser in the Last Year:   . Arboriculturist in the Last Year:   Transportation Needs:   . Film/video editor (Medical):   Marland Kitchen Lack of Transportation (Non-Medical):   Physical Activity:   . Days of Exercise per Week:   . Minutes of Exercise per Session:   Stress:   . Feeling of Stress :   Social Connections:   . Frequency of Communication with Friends and Family:   . Frequency of Social Gatherings with Friends and Family:   . Attends Religious Services:   . Active Member of Clubs or Organizations:   . Attends Archivist Meetings:   Marland Kitchen Marital Status:   Intimate Partner Violence:   . Fear of Current or Ex-Partner:   . Emotionally Abused:   Marland Kitchen Physically Abused:   . Sexually Abused:    PHYSICAL EXAM  Vitals:   10/12/19 0850  BP: (!) 160/92  Pulse: 68  Weight: 176 lb (79.8 kg)  Height: '5\' 4"'  (1.626 m)   Body mass index is 30.21 kg/m.  Generalized: Well developed, in no acute distress   Neurological examination  Mentation: Alert oriented to time, place, history taking. Follows all commands speech and language fluent Cranial nerve II-XII: Pupils were equal round reactive to light. Extraocular movements were full, visual field were full on confrontational test. Facial sensation and strength were normal. Head turning and shoulder shrug  were normal and symmetric. Motor: The motor testing reveals 5 over 5 strength of all 4 extremities. Good symmetric motor tone is noted  throughout.  Sensory: Sensory testing is intact to soft touch on all 4 extremities. No evidence of extinction is noted.  Coordination: Cerebellar testing reveals good finger-nose-finger and heel-to-shin bilaterally.  Gait and station: Gait is normal. Tandem gait is normal. Romberg is negative. No drift is seen.  Reflexes: Deep tendon reflexes are symmetric and normal bilaterally.   DIAGNOSTIC DATA (LABS, IMAGING, TESTING) - I reviewed patient records, labs, notes, testing and imaging myself where available.  Lab Results  Component Value Date  WBC 9.0 09/19/2019   HGB 13.4 09/19/2019   HCT 38.8 09/19/2019   MCV 79 09/19/2019   PLT 255 09/19/2019      Component Value Date/Time   NA 145 (H) 09/19/2019 1141   K 4.0 09/19/2019 1141   CL 105 09/19/2019 1141   CO2 29 09/19/2019 1141   GLUCOSE 151 (H) 09/19/2019 1141   GLUCOSE 149 (H) 09/22/2016 0801   BUN 15 09/19/2019 1141   CREATININE 0.88 09/19/2019 1141   CREATININE 0.77 09/22/2012 1213   CALCIUM 8.9 09/19/2019 1141   PROT 6.6 09/19/2019 1141   ALBUMIN 3.9 09/19/2019 1141   AST 12 09/19/2019 1141   ALT 9 09/19/2019 1141   ALKPHOS 220 (H) 09/19/2019 1141   BILITOT 0.2 09/19/2019 1141   GFRNONAA 72 09/19/2019 1141   GFRNONAA 89 09/22/2012 1213   GFRAA 83 09/19/2019 1141   GFRAA >89 09/22/2012 1213   Lab Results  Component Value Date   CHOL 133 09/19/2019   HDL 55 09/19/2019   LDLCALC 65 09/19/2019   TRIG 65 09/19/2019   CHOLHDL 2.4 09/19/2019   Lab Results  Component Value Date   HGBA1C 6.2 09/19/2019   No results found for: [id number] Lab Results  Component Value Date   TSH 2.370 06/18/2017    ASSESSMENT AND PLAN 59 y.o. year old female  has a past medical history of Diabetes mellitus without complication ([place]), Hypertension, and Seizures ([place]). here with:  1.  Seizures  She has not had a seizure in over 20 years.  She will remain on current dose of Dilantin.  I will check a Dilantin level.  She has routine  blood work done with her PCP.  Since her seizures have been stable for over 20 years, we talked about she can follow-up here on an as-needed basis, continue routine follow-up with PCP. Reviewed recent CBC, CMP (chronic alk phos elevated, possibly long term dilantin use?) Previous attempted dose reduction of Dilantin, resulted in seizures, return for seizure, lab abnormality, or dose adjustment.   I spent 20 minutes of face-to-face and non-face-to-face time with patient.  This included previsit chart review, lab review, study review, order entry, electronic health record documentation, patient education.  Butler Denmark, AGNP-C, DNP 10/12/2019, 9:13 AM [facility] [address], [place], [place] [address] [phone]

## 2019-10-13 ENCOUNTER — Telehealth: Payer: Self-pay

## 2019-10-13 LAB — PHENYTOIN LEVEL, TOTAL: Phenytoin (Dilantin), Serum: 3.7 ug/mL — ABNORMAL LOW (ref 10.0–20.0)

## 2019-10-13 NOTE — Telephone Encounter (Signed)
Left message for patient to call back  

## 2019-10-13 NOTE — Telephone Encounter (Signed)
-----   Message from Suzzanne Cloud, NP sent at 10/13/2019  7:58 AM EDT ----- Dilantin level is 3.7, low range of normal, however seizures quite well controlled, will not alter dosing.

## 2020-03-21 ENCOUNTER — Encounter: Payer: Self-pay | Admitting: Nurse Practitioner

## 2020-03-21 ENCOUNTER — Ambulatory Visit (INDEPENDENT_AMBULATORY_CARE_PROVIDER_SITE_OTHER): Payer: BC Managed Care – PPO | Admitting: Nurse Practitioner

## 2020-03-21 DIAGNOSIS — J00 Acute nasopharyngitis [common cold]: Secondary | ICD-10-CM

## 2020-03-21 MED ORDER — BENZONATATE 100 MG PO CAPS
100.0000 mg | ORAL_CAPSULE | Freq: Three times a day (TID) | ORAL | 0 refills | Status: DC | PRN
Start: 2020-03-21 — End: 2020-04-13

## 2020-03-21 NOTE — Progress Notes (Signed)
Virtual Visit via telephone Note Due to COVID-19 pandemic this visit was conducted virtually. This visit type was conducted due to national recommendations for restrictions regarding the COVID-19 Pandemic (e.g. social distancing, sheltering in place) in an effort to limit this patient's exposure and mitigate transmission in our community. All issues noted in this document were discussed and addressed.  A physical exam was not performed with this format.  I connected with Melanie Maynard on 03/21/20 at 10:45 by telephone and verified that I am speaking with the correct person using two identifiers. Melanie Maynard is currently located at home and no one is currently with her during visit. The provider, [name] Done, FNP is located in their office at time of visit.  I discussed the limitations, risks, security and privacy concerns of performing an evaluation and management service by telephone and the availability of in person appointments. I also discussed with the patient that there may be a patient responsible charge related to this service. The patient expressed understanding and agreed to proceed.   History and Present Illness:   Chief Complaint: Cough   HPI Patient woke up this morning with a cough and runny nose. She denies any fever or body aches. She has not been exposed to covid and has had 2 vaccines.    Review of Systems  Constitutional: Positive for malaise/fatigue. Negative for chills and fever.  HENT: Positive for congestion. Negative for ear discharge, ear pain, sinus pain and sore throat.   Respiratory: Positive for cough. Negative for sputum production and shortness of breath.   Musculoskeletal: Negative for myalgias.  Neurological: Negative for dizziness and headaches.  All other systems reviewed and are negative.    Observations/Objective: Alert and oriented- answers all questions appropriately No distress Loose cogh noted   Assessment and Plan: Melanie Maynard  in today with chief complaint of Cough   1. Acute nasopharyngitis 1. Take meds as prescribed 2. Use a cool mist humidifier especially during the winter months and when heat has been humid. 3. Use saline nose sprays frequently 4. Saline irrigations of the nose can be very helpful if done frequently.  * 4X daily for 1 week*  * Use of a nettie pot can be helpful with this. Follow directions with this* 5. Drink plenty of fluids 6. Keep thermostat turn down low 7.For any cough or congestion  Use plain Mucinex- regular strength or max strength is fine   * Children- consult with Pharmacist for dosing 8. For fever or aces or pains- take tylenol or ibuprofen appropriate for age and weight.  * for fevers greater than 101 orally you may alternate ibuprofen and tylenol every  3 hours.   Meds ordered this encounter  Medications  . benzonatate (TESSALON PERLES) 100 MG capsule    Sig: Take 1 capsule (100 mg total) by mouth 3 (three) times daily as needed.    Dispense:  20 capsule    Refill:  0    Order Specific Question:   Supervising Provider    Answer:   Caryl Pina A [id number]        Follow Up Instructions: prn    I discussed the assessment and treatment plan with the patient. The patient was provided an opportunity to ask questions and all were answered. The patient agreed with the plan and demonstrated an understanding of the instructions.   The patient was advised to call back or seek an in-person evaluation if the symptoms worsen or if the condition  fails to improve as anticipated.  The above assessment and management plan was discussed with the patient. The patient verbalized understanding of and has agreed to the management plan. Patient is aware to call the clinic if symptoms persist or worsen. Patient is aware when to return to the clinic for a follow-up visit. Patient educated on when it is appropriate to go to the emergency department.   Time call ended:  10:57  I  provided 12 minutes of non-face-to-face time during this encounter.    Mary-Margaret Daphine Deutscher, FNP

## 2020-04-06 ENCOUNTER — Other Ambulatory Visit: Payer: Self-pay | Admitting: Nurse Practitioner

## 2020-04-06 DIAGNOSIS — R569 Unspecified convulsions: Secondary | ICD-10-CM

## 2020-04-06 DIAGNOSIS — E119 Type 2 diabetes mellitus without complications: Secondary | ICD-10-CM

## 2020-04-12 DIAGNOSIS — H2512 Age-related nuclear cataract, left eye: Secondary | ICD-10-CM | POA: Diagnosis not present

## 2020-04-12 DIAGNOSIS — H5213 Myopia, bilateral: Secondary | ICD-10-CM | POA: Diagnosis not present

## 2020-04-12 DIAGNOSIS — H524 Presbyopia: Secondary | ICD-10-CM | POA: Diagnosis not present

## 2020-04-12 DIAGNOSIS — H5203 Hypermetropia, bilateral: Secondary | ICD-10-CM | POA: Diagnosis not present

## 2020-04-12 DIAGNOSIS — H52223 Regular astigmatism, bilateral: Secondary | ICD-10-CM | POA: Diagnosis not present

## 2020-04-12 LAB — HM DIABETES EYE EXAM

## 2020-04-13 ENCOUNTER — Encounter: Payer: Self-pay | Admitting: Nurse Practitioner

## 2020-04-13 ENCOUNTER — Ambulatory Visit (INDEPENDENT_AMBULATORY_CARE_PROVIDER_SITE_OTHER): Payer: BC Managed Care – PPO | Admitting: Nurse Practitioner

## 2020-04-13 ENCOUNTER — Other Ambulatory Visit: Payer: Self-pay

## 2020-04-13 VITALS — BP 154/93 | HR 75 | Temp 98.1°F | Resp 20 | Ht 64.0 in | Wt 169.0 lb

## 2020-04-13 DIAGNOSIS — Z6829 Body mass index (BMI) 29.0-29.9, adult: Secondary | ICD-10-CM

## 2020-04-13 DIAGNOSIS — E119 Type 2 diabetes mellitus without complications: Secondary | ICD-10-CM

## 2020-04-13 DIAGNOSIS — R569 Unspecified convulsions: Secondary | ICD-10-CM

## 2020-04-13 DIAGNOSIS — M792 Neuralgia and neuritis, unspecified: Secondary | ICD-10-CM

## 2020-04-13 DIAGNOSIS — E559 Vitamin D deficiency, unspecified: Secondary | ICD-10-CM

## 2020-04-13 DIAGNOSIS — E785 Hyperlipidemia, unspecified: Secondary | ICD-10-CM

## 2020-04-13 DIAGNOSIS — I10 Essential (primary) hypertension: Secondary | ICD-10-CM | POA: Diagnosis not present

## 2020-04-13 LAB — BAYER DCA HB A1C WAIVED: HB A1C (BAYER DCA - WAIVED): 6.4 % (ref <7.0)

## 2020-04-13 MED ORDER — ATORVASTATIN CALCIUM 40 MG PO TABS: 40.0000 mg | ORAL_TABLET | Freq: Every day | ORAL | 1 refills | Status: DC

## 2020-04-13 MED ORDER — AMLODIPINE BESYLATE 5 MG PO TABS
5.0000 mg | ORAL_TABLET | Freq: Every day | ORAL | 1 refills | Status: DC
Start: 2020-04-13 — End: 2020-04-13

## 2020-04-13 MED ORDER — LOSARTAN POTASSIUM 100 MG PO TABS: 100.0000 mg | ORAL_TABLET | Freq: Every day | ORAL | 1 refills | Status: DC

## 2020-04-13 MED ORDER — PHENYTOIN SODIUM EXTENDED 100 MG PO CAPS: 300.0000 mg | ORAL_CAPSULE | Freq: Every day | ORAL | 1 refills | Status: DC

## 2020-04-13 MED ORDER — METFORMIN HCL ER 500 MG PO TB24
ORAL_TABLET | ORAL | 1 refills | Status: DC
Start: 2020-04-13 — End: 2020-12-18

## 2020-04-13 MED ORDER — AMLODIPINE BESYLATE 10 MG PO TABS: 10.0000 mg | ORAL_TABLET | Freq: Every day | ORAL | 1 refills | Status: DC

## 2020-04-13 NOTE — Patient Instructions (Signed)

## 2020-04-13 NOTE — Progress Notes (Signed)
Subjective:    Patient ID: Melanie Maynard, female    DOB: 04/26/1960, 60 y.o.   MRN: [id number]   Chief Complaint: Medical Management of Chronic Issues    HPI:  1. Essential hypertension No c/o chest pain, sob or headache. Does not check blood pressure at home. BP Readings from Last 3 Encounters:  04/13/20 (!) 154/93  10/12/19 (!) 160/92  09/19/19 125/86     2. Hyperlipidemia with target LDL less than 100 Doe sot watch diet and does very little exercise. Lab Results  Component Value Date   CHOL 133 09/19/2019   HDL 55 09/19/2019   LDLCALC 65 09/19/2019   TRIG 65 09/19/2019   CHOLHDL 2.4 09/19/2019     3. Type 2 diabetes mellitus treated without insulin ([facility]) She has not been checking her blood sugars. She only checks them when she feels bad. She denies nay low blood sugar symptoms Lab Results  Component Value Date   HGBA1C 6.2 09/19/2019     4. Neuralgia of right lower extremity Has not ben bothering her lately  5. Seizures ([place]) Has not had a seizure in over 20 years.  6. Vitamin D deficiency Is on vitamin d supplement  7. Morbid obesity ([place]) Weight has gradually been coming down Wt Readings from Last 3 Encounters:  04/13/20 169 lb (76.7 kg)  10/12/19 176 lb (79.8 kg)  09/19/19 175 lb (79.4 kg)   BMI Readings from Last 3 Encounters:  04/13/20 29.01 kg/m  10/12/19 30.21 kg/m  09/19/19 30.04 kg/m       Outpatient Encounter Medications as of 04/13/2020  Medication Sig   phenytoin (DILANTIN) 100 MG ER capsule TAKE 3 CAPSULES BY MOUTH AT BEDTIME   amLODipine (NORVASC) 5 MG tablet Take 1 tablet (5 mg total) by mouth daily.   atorvastatin (LIPITOR) 40 MG tablet Take 1 tablet (40 mg total) by mouth daily.   benzonatate (TESSALON PERLES) 100 MG capsule Take 1 capsule (100 mg total) by mouth 3 (three) times daily as needed.   Cholecalciferol (VITAMIN D) 2000 units CAPS Take 1 capsule (2,000 Units total) by mouth daily.   glucose blood (ONETOUCH  VERIO) test strip Test 1x per day and prn  Dx e11.9   losartan (COZAAR) 100 MG tablet Take 1 tablet (100 mg total) by mouth daily.   metFORMIN (GLUCOPHAGE-XR) 500 MG 24 hr tablet TAKE 1 TABLET BY MOUTH ONCE DAILY IN THE EVENING WITH MEALS   ONETOUCH DELICA LANCETS 56P MISC 1 each by Does not apply route daily. Test 1x per day and prn  Dx E11.9   No facility-administered encounter medications on file as of 04/13/2020.    Past Surgical History:  Procedure Laterality Date   ABDOMINAL HYSTERECTOMY     APPENDECTOMY     CATARACT EXTRACTION W/PHACO Right 09/26/2016   Procedure: CATARACT EXTRACTION PHACO AND INTRAOCULAR LENS PLACEMENT (IOC);  Surgeon: Baruch Goldmann, MD;  Location: [facility];  Service: Ophthalmology;  Laterality: Right;  CDE: 3.16   CHOLECYSTECTOMY      Family History  Problem Relation Age of Onset   Diabetes Mother    Heart attack Mother 77   Healthy Father    Alcohol abuse Father    Diabetes Sister    Cancer Sister        breast   Cirrhosis Brother    Leukemia Sister    Diabetes Sister    Kidney disease Sister        on dialysis    New complaints:  None today  Social history: Lives by herself. Family check son her daily  Controlled substance contract: n/a    Review of Systems  Constitutional: Negative for diaphoresis.  Eyes: Negative for pain.  Respiratory: Negative for shortness of breath.   Cardiovascular: Negative for chest pain, palpitations and leg swelling.  Gastrointestinal: Negative for abdominal pain.  Endocrine: Negative for polydipsia.  Skin: Negative for rash.  Neurological: Negative for dizziness, weakness and headaches.  Hematological: Does not bruise/bleed easily.  All other systems reviewed and are negative.      Objective:   Physical Exam Vitals and nursing note reviewed.  Constitutional:      General: She is not in acute distress.    Appearance: Normal appearance. She is well-developed and well-nourished.  HENT:      Head: Normocephalic.     Nose: Nose normal.     Mouth/Throat:     Mouth: Oropharynx is clear and moist.  Eyes:     Extraocular Movements: EOM normal.     Pupils: Pupils are equal, round, and reactive to light.  Neck:     Vascular: No carotid bruit or JVD.  Cardiovascular:     Rate and Rhythm: Normal rate and regular rhythm.     Pulses: Intact distal pulses.     Heart sounds: Normal heart sounds.  Pulmonary:     Effort: Pulmonary effort is normal. No respiratory distress.     Breath sounds: Normal breath sounds. No wheezing or rales.  Chest:     Chest wall: No tenderness.  Abdominal:     General: Bowel sounds are normal. There is no distension or abdominal bruit. Aorta is normal.     Palpations: Abdomen is soft. There is no hepatomegaly, splenomegaly, mass or pulsatile mass.     Tenderness: There is no abdominal tenderness.  Musculoskeletal:        General: No edema. Normal range of motion.     Cervical back: Normal range of motion and neck supple.  Lymphadenopathy:     Cervical: No cervical adenopathy.  Skin:    General: Skin is warm and dry.  Neurological:     Mental Status: She is alert and oriented to person, place, and time.     Deep Tendon Reflexes: Reflexes are normal and symmetric.  Psychiatric:        Mood and Affect: Mood and affect normal.        Behavior: Behavior normal.        Thought Content: Thought content normal.        Judgment: Judgment normal.    BP (!) 154/93    Pulse 75    Temp 98.1 F (36.7 C) (Temporal)    Resp 20    Ht '5\' 4"'  (1.626 m)    Wt 169 lb (76.7 kg)    LMP 01/23/2001 (Approximate)    SpO2 99%    BMI 29.01 kg/m   hgba1c 6.4%       Assessment & Plan:  [name] comes in today with chief complaint of Medical Management of Chronic Issues   Diagnosis and orders addressed:  1. Essential hypertension Increased norvasc to 58m daily- watch for lower ext ecema - CBC with Differential/Platelet - CMP14+EGFR - losartan (COZAAR) 100 MG  tablet; Take 1 tablet (100 mg total) by mouth daily.  Dispense: 90 tablet; Refill: 1 - amLODipine (NORVASC) 10 MG tablet; Take 1 tablet (10 mg total) by mouth daily.  Dispense: 90 tablet; Refill: 1  2. Hyperlipidemia with  target LDL less than 100 Low fat diet - Lipid panel - atorvastatin (LIPITOR) 40 MG tablet; Take 1 tablet (40 mg total) by mouth daily.  Dispense: 90 tablet; Refill: 1  3. Type 2 diabetes mellitus treated without insulin ([facility]) Continue to watch carbs in diet - Bayer DCA Hb A1c Waived - metFORMIN (GLUCOPHAGE-XR) 500 MG 24 hr tablet; TAKE 1 TABLET BY MOUTH ONCE DAILY IN THE EVENING WITH MEALS  Dispense: 90 tablet; Refill: 1  4. Neuralgia of right lower extremity  5. Seizures ([facility]) - phenytoin (DILANTIN) 100 MG ER capsule; Take 3 capsules (300 mg total) by mouth at bedtime.  Dispense: 270 capsule; Refill: 1  6. Vitamin D deficiency Continue vitamin d supplement  7. BMI 29.0-29.9,adult Discussed diet and exercise for person with BMI >25 Will recheck weight in 3-6 months   Labs pending Health Maintenance reviewed Diet and exercise encouraged  Follow up plan: 6 months   [name] Done, FNP

## 2020-04-14 LAB — CBC WITH DIFFERENTIAL/PLATELET
Basophils Absolute: 0.1 10*3/uL (ref 0.0–0.2)
Basos: 1 %
EOS (ABSOLUTE): 0.4 10*3/uL (ref 0.0–0.4)
Eos: 4 %
Hematocrit: 40.8 % (ref 34.0–46.6)
Hemoglobin: 14.1 g/dL (ref 11.1–15.9)
Immature Grans (Abs): 0 10*3/uL (ref 0.0–0.1)
Immature Granulocytes: 0 %
Lymphocytes Absolute: 4.3 10*3/uL — ABNORMAL HIGH (ref 0.7–3.1)
Lymphs: 38 %
MCH: 26.4 pg — ABNORMAL LOW (ref 26.6–33.0)
MCHC: 34.6 g/dL (ref 31.5–35.7)
MCV: 76 fL — ABNORMAL LOW (ref 79–97)
Monocytes Absolute: 0.5 10*3/uL (ref 0.1–0.9)
Monocytes: 5 %
Neutrophils Absolute: 6 10*3/uL (ref 1.4–7.0)
Neutrophils: 52 %
Platelets: 285 10*3/uL (ref 150–450)
RBC: 5.35 x10E6/uL — ABNORMAL HIGH (ref 3.77–5.28)
RDW: 13.8 % (ref 11.7–15.4)
WBC: 11.3 10*3/uL — ABNORMAL HIGH (ref 3.4–10.8)

## 2020-04-14 LAB — CMP14+EGFR
ALT: 10 IU/L (ref 0–32)
AST: 15 IU/L (ref 0–40)
Albumin/Globulin Ratio: 1.5 (ref 1.2–2.2)
Albumin: 4.1 g/dL (ref 3.8–4.9)
Alkaline Phosphatase: 225 IU/L — ABNORMAL HIGH (ref 44–121)
BUN/Creatinine Ratio: 17 (ref 9–23)
BUN: 14 mg/dL (ref 6–24)
Bilirubin Total: <0.2 mg/dL (ref 0.0–1.2)
CO2: 26 mmol/L (ref 20–29)
Calcium: 9 mg/dL (ref 8.7–10.2)
Chloride: 103 mmol/L (ref 96–106)
Creatinine, Ser: 0.83 mg/dL (ref 0.57–1.00)
GFR calc Af Amer: 89 mL/min/{1.73_m2} (ref >59)
GFR calc non Af Amer: 77 mL/min/{1.73_m2} (ref >59)
Globulin, Total: 2.8 g/dL (ref 1.5–4.5)
Glucose: 78 mg/dL (ref 65–99)
Potassium: 4.4 mmol/L (ref 3.5–5.2)
Sodium: 139 mmol/L (ref 134–144)
Total Protein: 6.9 g/dL (ref 6.0–8.5)

## 2020-04-14 LAB — LIPID PANEL
Chol/HDL Ratio: 2.9 ratio (ref 0.0–4.4)
Cholesterol, Total: 181 mg/dL (ref 100–199)
HDL: 62 mg/dL (ref >39)
LDL Chol Calc (NIH): 102 mg/dL — ABNORMAL HIGH (ref 0–99)
Triglycerides: 93 mg/dL (ref 0–149)
VLDL Cholesterol Cal: 17 mg/dL (ref 5–40)

## 2020-04-16 DIAGNOSIS — H43813 Vitreous degeneration, bilateral: Secondary | ICD-10-CM | POA: Diagnosis not present

## 2020-04-16 DIAGNOSIS — H3561 Retinal hemorrhage, right eye: Secondary | ICD-10-CM | POA: Diagnosis not present

## 2020-04-16 DIAGNOSIS — H44113 Panuveitis, bilateral: Secondary | ICD-10-CM | POA: Diagnosis not present

## 2020-04-16 DIAGNOSIS — H43822 Vitreomacular adhesion, left eye: Secondary | ICD-10-CM | POA: Diagnosis not present

## 2020-07-08 ENCOUNTER — Other Ambulatory Visit: Payer: Self-pay | Admitting: Nurse Practitioner

## 2020-07-08 DIAGNOSIS — R569 Unspecified convulsions: Secondary | ICD-10-CM

## 2020-07-09 NOTE — Telephone Encounter (Signed)
  Prescription Request  07/09/2020  What is the name of the medication or equipment? Phenatone? Seizure meds  Have you contacted your pharmacy to request a refill? (if applicable) yes  Which pharmacy would you like this sent to? [facility]   Patient notified that their request is being sent to the clinical staff for review and that they should receive a response within 2 business days.   MMM's pt.  Completely out today of her meds & she needs them!  Please call pt.

## 2020-09-17 ENCOUNTER — Other Ambulatory Visit: Payer: Self-pay | Admitting: Nurse Practitioner

## 2020-09-17 DIAGNOSIS — Z1231 Encounter for screening mammogram for malignant neoplasm of breast: Secondary | ICD-10-CM

## 2020-10-01 DIAGNOSIS — H44113 Panuveitis, bilateral: Secondary | ICD-10-CM | POA: Diagnosis not present

## 2020-10-01 DIAGNOSIS — H43813 Vitreous degeneration, bilateral: Secondary | ICD-10-CM | POA: Diagnosis not present

## 2020-10-01 DIAGNOSIS — H31093 Other chorioretinal scars, bilateral: Secondary | ICD-10-CM | POA: Diagnosis not present

## 2020-10-01 DIAGNOSIS — H43822 Vitreomacular adhesion, left eye: Secondary | ICD-10-CM | POA: Diagnosis not present

## 2020-10-12 ENCOUNTER — Ambulatory Visit: Payer: BC Managed Care – PPO | Admitting: Nurse Practitioner

## 2020-10-12 ENCOUNTER — Encounter: Payer: Self-pay | Admitting: Nurse Practitioner

## 2020-10-12 DIAGNOSIS — J069 Acute upper respiratory infection, unspecified: Secondary | ICD-10-CM

## 2020-10-12 NOTE — Progress Notes (Signed)
Virtual Visit  Note Due to COVID-19 pandemic this visit was conducted virtually. This visit type was conducted due to national recommendations for restrictions regarding the COVID-19 Pandemic (e.g. social distancing, sheltering in place) in an effort to limit this patient's exposure and mitigate transmission in our community. All issues noted in this document were discussed and addressed.  A physical exam was not performed with this format.  I connected with Melanie Maynard on 10/12/20 at 4:20 by telephone and verified that I am speaking with the correct person using two identifiers. Melanie Maynard is currently located at home and no one is currently with her during visit. The provider, [name] Done, FNP is located in their office at time of visit.  I discussed the limitations, risks, security and privacy concerns of performing an evaluation and management service by telephone and the availability of in person appointments. I also discussed with the patient that there may be a patient responsible charge related to this service. The patient expressed understanding and agreed to proceed.   History and Present Illness:    Chief Complaint: URI  HPI Patient calls in c/o cough and congestion that starte dthios morning. She has been taking sinus pills and I is getting better.     Review of Systems  Constitutional:  Negative for chills and fever.  HENT:  Positive for congestion. Negative for sinus pain and sore throat.   Respiratory:  Positive for cough. Negative for sputum production and shortness of breath.   Musculoskeletal:  Negative for myalgias.  Neurological:  Positive for headaches.    Observations/Objective: Alert and oriented- answers all questions appropriately No distress   Assessment and Plan: Melanie Maynard in today with chief complaint of URI   1. URI with cough and congestion 1. Take meds as prescribed 2. Use a cool mist humidifier especially during the winter  months and when heat has been humid. 3. Use saline nose sprays frequently 4. Saline irrigations of the nose can be very helpful if done frequently.  * 4X daily for 1 week*  * Use of a nettie pot can be helpful with this. Follow directions with this* 5. Drink plenty of fluids 6. Keep thermostat turn down low 7.For any cough or congestion  Use plain Mucinex- regular strength or max strength is fine   * Children- consult with Pharmacist for dosing 8. For fever or aces or pains- take tylenol or ibuprofen appropriate for age and weight.  * for fevers greater than 101 orally you may alternate ibuprofen and tylenol every  3 hours.       Follow Up Instructions: prn    I discussed the assessment and treatment plan with the patient. The patient was provided an opportunity to ask questions and all were answered. The patient agreed with the plan and demonstrated an understanding of the instructions.   The patient was advised to call back or seek an in-person evaluation if the symptoms worsen or if the condition fails to improve as anticipated.  The above assessment and management plan was discussed with the patient. The patient verbalized understanding of and has agreed to the management plan. Patient is aware to call the clinic if symptoms persist or worsen. Patient is aware when to return to the clinic for a follow-up visit. Patient educated on when it is appropriate to go to the emergency department.   Time call ended:  444  I provided 13 minutes of  non face-to-face time during this encounter.  Mary-Margaret Jaydy Fitzhenry, FNP   

## 2020-10-17 ENCOUNTER — Other Ambulatory Visit: Payer: Self-pay | Admitting: Nurse Practitioner

## 2020-10-17 DIAGNOSIS — I10 Essential (primary) hypertension: Secondary | ICD-10-CM

## 2020-12-11 ENCOUNTER — Other Ambulatory Visit: Payer: Self-pay | Admitting: Nurse Practitioner

## 2020-12-11 DIAGNOSIS — I10 Essential (primary) hypertension: Secondary | ICD-10-CM

## 2020-12-11 NOTE — Telephone Encounter (Signed)
MMM NTBS 30 days given 10/18/20

## 2020-12-17 ENCOUNTER — Telehealth: Payer: Self-pay | Admitting: Nurse Practitioner

## 2020-12-17 DIAGNOSIS — I10 Essential (primary) hypertension: Secondary | ICD-10-CM

## 2020-12-17 MED ORDER — LOSARTAN POTASSIUM 100 MG PO TABS
100.0000 mg | ORAL_TABLET | Freq: Every day | ORAL | 0 refills | Status: DC
Start: 2020-12-17 — End: 2021-02-06

## 2020-12-17 NOTE — Telephone Encounter (Signed)
Pt has not been seen since Jan for her chronic issues. Appt made for 12/18/20. Refill on Losartan sent to pharmacy

## 2020-12-17 NOTE — Telephone Encounter (Signed)
  Prescription Request  12/17/2020  Is this a "Controlled Substance" medicine? no Have you seen your PCP in the last 2 weeks? no If YES, route message to pool  -  If NO, patient needs to be scheduled for appointment.  What is the name of the medication or equipment? Pt isn't sure of name (b/p meds-She takes 2 b/p meds)  Have you contacted your pharmacy to request a refill? Yes, but hung up  Which pharmacy would you like this sent to? [facility]   Patient notified that their request is being sent to the clinical staff for review and that they should receive a response within 2 business days.    MMM's pt.  Please call pt.  She threw bottle away by mistake.

## 2020-12-18 ENCOUNTER — Other Ambulatory Visit: Payer: Self-pay

## 2020-12-18 ENCOUNTER — Ambulatory Visit: Payer: BC Managed Care – PPO | Admitting: Nurse Practitioner

## 2020-12-18 ENCOUNTER — Encounter: Payer: Self-pay | Admitting: Nurse Practitioner

## 2020-12-18 VITALS — BP 145/92 | HR 75 | Temp 96.8°F | Resp 20 | Ht 64.0 in | Wt 164.0 lb

## 2020-12-18 DIAGNOSIS — E119 Type 2 diabetes mellitus without complications: Secondary | ICD-10-CM

## 2020-12-18 DIAGNOSIS — E785 Hyperlipidemia, unspecified: Secondary | ICD-10-CM

## 2020-12-18 DIAGNOSIS — Z6828 Body mass index (BMI) 28.0-28.9, adult: Secondary | ICD-10-CM

## 2020-12-18 DIAGNOSIS — I10 Essential (primary) hypertension: Secondary | ICD-10-CM | POA: Diagnosis not present

## 2020-12-18 DIAGNOSIS — R569 Unspecified convulsions: Secondary | ICD-10-CM

## 2020-12-18 DIAGNOSIS — E559 Vitamin D deficiency, unspecified: Secondary | ICD-10-CM

## 2020-12-18 LAB — CBC WITH DIFFERENTIAL/PLATELET
Basophils Absolute: 0.1 10*3/uL (ref 0.0–0.2)
Basos: 1 %
EOS (ABSOLUTE): 0.4 10*3/uL (ref 0.0–0.4)
Eos: 5 %
Hematocrit: 41.7 % (ref 34.0–46.6)
Hemoglobin: 14.3 g/dL (ref 11.1–15.9)
Immature Grans (Abs): 0 10*3/uL (ref 0.0–0.1)
Immature Granulocytes: 0 %
Lymphocytes Absolute: 3.2 10*3/uL — ABNORMAL HIGH (ref 0.7–3.1)
Lymphs: 36 %
MCH: 26.2 pg — ABNORMAL LOW (ref 26.6–33.0)
MCHC: 34.3 g/dL (ref 31.5–35.7)
MCV: 76 fL — ABNORMAL LOW (ref 79–97)
Monocytes Absolute: 0.5 10*3/uL (ref 0.1–0.9)
Monocytes: 5 %
Neutrophils Absolute: 4.8 10*3/uL (ref 1.4–7.0)
Neutrophils: 53 %
Platelets: 283 10*3/uL (ref 150–450)
RBC: 5.46 x10E6/uL — ABNORMAL HIGH (ref 3.77–5.28)
RDW: 13.4 % (ref 11.7–15.4)
WBC: 8.9 10*3/uL (ref 3.4–10.8)

## 2020-12-18 LAB — CMP14+EGFR
ALT: 6 IU/L (ref 0–32)
AST: 13 IU/L (ref 0–40)
Albumin/Globulin Ratio: 1.6 (ref 1.2–2.2)
Albumin: 4.2 g/dL (ref 3.8–4.9)
Alkaline Phosphatase: 208 IU/L — ABNORMAL HIGH (ref 44–121)
BUN/Creatinine Ratio: 15 (ref 12–28)
BUN: 12 mg/dL (ref 8–27)
Bilirubin Total: 0.3 mg/dL (ref 0.0–1.2)
CO2: 28 mmol/L (ref 20–29)
Calcium: 9.1 mg/dL (ref 8.7–10.3)
Chloride: 100 mmol/L (ref 96–106)
Creatinine, Ser: 0.8 mg/dL (ref 0.57–1.00)
Globulin, Total: 2.6 g/dL (ref 1.5–4.5)
Glucose: 121 mg/dL — ABNORMAL HIGH (ref 70–99)
Potassium: 3.6 mmol/L (ref 3.5–5.2)
Sodium: 141 mmol/L (ref 134–144)
Total Protein: 6.8 g/dL (ref 6.0–8.5)
eGFR: 84 mL/min/{1.73_m2} (ref >59)

## 2020-12-18 LAB — LIPID PANEL
Chol/HDL Ratio: 2.8 ratio (ref 0.0–4.4)
Cholesterol, Total: 166 mg/dL (ref 100–199)
HDL: 59 mg/dL (ref >39)
LDL Chol Calc (NIH): 90 mg/dL (ref 0–99)
Triglycerides: 94 mg/dL (ref 0–149)
VLDL Cholesterol Cal: 17 mg/dL (ref 5–40)

## 2020-12-18 LAB — BAYER DCA HB A1C WAIVED: HB A1C (BAYER DCA - WAIVED): 6 % — ABNORMAL HIGH (ref 4.8–5.6)

## 2020-12-18 MED ORDER — METFORMIN HCL ER 500 MG PO TB24: ORAL_TABLET | ORAL | 1 refills | Status: DC

## 2020-12-18 MED ORDER — PHENYTOIN SODIUM EXTENDED 100 MG PO CAPS: 300.0000 mg | ORAL_CAPSULE | Freq: Every day | ORAL | 1 refills | Status: DC

## 2020-12-18 MED ORDER — ATORVASTATIN CALCIUM 40 MG PO TABS: 40.0000 mg | ORAL_TABLET | Freq: Every day | ORAL | 1 refills | Status: DC

## 2020-12-18 NOTE — Patient Instructions (Signed)
Exercising to Stay Healthy °To become healthy and stay healthy, it is recommended that you do moderate-intensity and vigorous-intensity exercise. You can tell that you are exercising at a moderate intensity if your heart starts beating faster and you start breathing faster but can still hold a conversation. You can tell that you are exercising at a vigorous intensity if you are breathing much harder and faster and cannot hold a conversation while exercising. °How can exercise benefit me? °Exercising regularly is important. It has many health benefits, such as: °Improving overall fitness, flexibility, and endurance. °Increasing bone density. °Helping with weight control. °Decreasing body fat. °Increasing muscle strength and endurance. °Reducing stress and tension, anxiety, depression, or anger. °Improving overall health. °What guidelines should I follow while exercising? °Before you start a new exercise program, talk with your health care provider. °Do not exercise so much that you hurt yourself, feel dizzy, or get very short of breath. °Wear comfortable clothes and wear shoes with good support. °Drink plenty of water while you exercise to prevent dehydration or heat stroke. °Work out until your breathing and your heartbeat get faster (moderate intensity). °How often should I exercise? °Choose an activity that you enjoy, and set realistic goals. Your health care provider can help you make an activity plan that is individually designed and works best for you. °Exercise regularly as told by your health care provider. This may include: °Doing strength training two times a week, such as: °Lifting weights. °Using resistance bands. °Push-ups. °Sit-ups. °Yoga. °Doing a certain intensity of exercise for a given amount of time. Choose from these options: °A total of 150 minutes of moderate-intensity exercise every week. °A total of 75 minutes of vigorous-intensity exercise every week. °A mix of moderate-intensity and  vigorous-intensity exercise every week. °Children, pregnant women, people who have not exercised regularly, people who are overweight, and older adults may need to talk with a health care provider about what activities are safe to perform. If you have a medical condition, be sure to talk with your health care provider before you start a new exercise program. °What are some exercise ideas? °Moderate-intensity exercise ideas include: °Walking 1 mile (1.6 km) in about 15 minutes. °Biking. °Hiking. °Golfing. °Dancing. °Water aerobics. °Vigorous-intensity exercise ideas include: °Walking 4.5 miles (7.2 km) or more in about 1 hour. °Jogging or running 5 miles (8 km) in about 1 hour. °Biking 10 miles (16.1 km) or more in about 1 hour. °Lap swimming. °Roller-skating or in-line skating. °Cross-country skiing. °Vigorous competitive sports, such as football, basketball, and soccer. °Jumping rope. °Aerobic dancing. °What are some everyday activities that can help me get exercise? °Yard work, such as: °Pushing a lawn mower. °Raking and bagging leaves. °Washing your car. °Pushing a stroller. °Shoveling snow. °Gardening. °Washing windows or floors. °How can I be more active in my day-to-day activities? °Use stairs instead of an elevator. °Take a walk during your lunch break. °If you drive, park your car farther away from your work or school. °If you take public transportation, get off one stop early and walk the rest of the way. °Stand up or walk around during all of your indoor phone calls. °Get up, stretch, and walk around every 30 minutes throughout the day. °Enjoy exercise with a friend. Support to continue exercising will help you keep a regular routine of activity. °Where to find more information °You can find more information about exercising to stay healthy from: °[place].S. Department of Health and Human Services: www.hhs.gov °Centers for Disease Control and Prevention (  CDC): www.cdc.gov °Summary °Exercising regularly is  important. It will improve your overall fitness, flexibility, and endurance. °Regular exercise will also improve your overall health. It can help you control your weight, reduce stress, and improve your bone density. °Do not exercise so much that you hurt yourself, feel dizzy, or get very short of breath. °Before you start a new exercise program, talk with your health care provider. °This information is not intended to replace advice given to you by your health care provider. Make sure you discuss any questions you have with your health care provider. °Document Revised: 07/06/2020 Document Reviewed: 07/06/2020 °Elsevier Patient Education © 2022 Elsevier Inc. ° °

## 2020-12-18 NOTE — Progress Notes (Signed)
Subjective:    Patient ID: Melanie Maynard, female    DOB: [birth year], 60 y.o.   MRN: [id number]  Chief Complaint: Medical Management of Chronic Issues    HPI:  1. Hyperlipidemia with target LDL less than 100 Does try to watch diet and does very little exercise. Lab Results  Component Value Date   CHOL 181 04/13/2020   HDL 62 04/13/2020   LDLCALC 102 (H) 04/13/2020   TRIG 93 04/13/2020   CHOLHDL 2.9 04/13/2020     2. Essential hypertension No c/o chest pain, sob or headache. Does not check blood pressure at home BP Readings from Last 3 Encounters:  12/18/20 (!) 145/92  04/13/20 (!) 154/93  10/12/19 (!) 160/92      3. Type 2 diabetes mellitus treated without insulin ([place]) She does not check her blood suagrs at home.  She doe snot wathc her diet Lab Results  Component Value Date   HGBA1C 6.4 04/13/2020     4. Seizures (HCC) Is dilantin daily. No recent seizures activity.  5. Vitamin D deficiency Daily vitamin d supplement  6. Morbid obesity ([place]) Weight is down 5lbs  Wt Readings from Last 3 Encounters:  12/18/20 164 lb (74.4 kg)  04/13/20 169 lb (76.7 kg)  10/12/19 176 lb (79.8 kg)   BMI Readings from Last 3 Encounters:  12/18/20 28.15 kg/m  04/13/20 29.01 kg/m  10/12/19 30.21 kg/m       Outpatient Encounter Medications as of 12/18/2020  Medication Sig   amLODipine (NORVASC) 10 MG tablet Take 1 tablet (10 mg total) by mouth daily.   atorvastatin (LIPITOR) 40 MG tablet Take 1 tablet (40 mg total) by mouth daily.   glucose blood (ONETOUCH VERIO) test strip Test 1x per day and prn  Dx e11.9   losartan (COZAAR) 100 MG tablet Take 1 tablet (100 mg total) by mouth daily.   metFORMIN (GLUCOPHAGE-XR) 500 MG 24 hr tablet TAKE 1 TABLET BY MOUTH ONCE DAILY IN THE EVENING WITH MEALS   ONETOUCH DELICA LANCETS 68T MISC 1 each by Does not apply route daily. Test 1x per day and prn  Dx E11.9   phenytoin (DILANTIN) 100 MG ER capsule Take 3 capsules (300 mg total)  by mouth at bedtime.   No facility-administered encounter medications on file as of 12/18/2020.    Past Surgical History:  Procedure Laterality Date   ABDOMINAL HYSTERECTOMY     APPENDECTOMY     CATARACT EXTRACTION W/PHACO Right 09/26/2016   Procedure: CATARACT EXTRACTION PHACO AND INTRAOCULAR LENS PLACEMENT ([facility]);  Surgeon: Baruch Goldmann, MD;  Location: [facility];  Service: Ophthalmology;  Laterality: Right;  CDE: 3.16   CHOLECYSTECTOMY      Family History  Problem Relation Age of Onset   Diabetes Mother    Heart attack Mother 49   Healthy Father    Alcohol abuse Father    Diabetes Sister    Cancer Sister        breast   Cirrhosis Brother    Leukemia Sister    Diabetes Sister    Kidney disease Sister        on dialysis    New complaints: None today  Social history: Lives by herself  Controlled substance contract: n/a     Review of Systems  Constitutional:  Negative for diaphoresis.  Eyes:  Negative for pain.  Respiratory:  Negative for shortness of breath.   Cardiovascular:  Negative for chest pain, palpitations and leg swelling.  Gastrointestinal:  Negative for  abdominal pain.  Endocrine: Negative for polydipsia.  Skin:  Negative for rash.  Neurological:  Negative for dizziness, weakness and headaches.  Hematological:  Does not bruise/bleed easily.  All other systems reviewed and are negative.     Objective:   Physical Exam Vitals and nursing note reviewed.  Constitutional:      General: She is not in acute distress.    Appearance: Normal appearance. She is well-developed.  HENT:     Head: Normocephalic.     Right Ear: Tympanic membrane normal.     Left Ear: Tympanic membrane normal.     Nose: Nose normal.     Mouth/Throat:     Mouth: Mucous membranes are moist.  Eyes:     Pupils: Pupils are equal, round, and reactive to light.  Neck:     Vascular: No carotid bruit or JVD.  Cardiovascular:     Rate and Rhythm: Normal rate and regular rhythm.      Heart sounds: Normal heart sounds.  Pulmonary:     Effort: Pulmonary effort is normal. No respiratory distress.     Breath sounds: Normal breath sounds. No wheezing or rales.  Chest:     Chest wall: No tenderness.  Abdominal:     General: Bowel sounds are normal. There is no distension or abdominal bruit.     Palpations: Abdomen is soft. There is no hepatomegaly, splenomegaly, mass or pulsatile mass.     Tenderness: There is no abdominal tenderness.  Musculoskeletal:        General: Normal range of motion.     Cervical back: Normal range of motion and neck supple.  Lymphadenopathy:     Cervical: No cervical adenopathy.  Skin:    General: Skin is warm and dry.  Neurological:     Mental Status: She is alert and oriented to person, place, and time.     Deep Tendon Reflexes: Reflexes are normal and symmetric.  Psychiatric:        Behavior: Behavior normal.        Thought Content: Thought content normal.        Judgment: Judgment normal.    BP (!) 145/92   Pulse 75   Temp (!) 96.8 F (36 C) (Temporal)   Resp 20   Ht '5\' 4"'  (1.626 m)   Wt 164 lb (74.4 kg)   LMP 01/23/2001 (Approximate)   SpO2 97%   BMI 28.15 kg/m   HGBA1c 6.0%     Assessment & Plan:  Melanie Maynard comes in today with chief complaint of Medical Management of Chronic Issues   Diagnosis and orders addressed:  1. Hyperlipidemia with target LDL less than 100 Low fat diet - Lipid panel - atorvastatin (LIPITOR) 40 MG tablet; Take 1 tablet (40 mg total) by mouth daily.  Dispense: 90 tablet; Refill: 1  2. Essential hypertension Low sodium diet - CBC with Differential/Platelet - CMP14+EGFR  3. Type 2 diabetes mellitus treated without insulin (HCC) Continue to watch carbs in diet - Bayer DCA Hb A1c Waived - Microalbumin / creatinine urine ratio - metFORMIN (GLUCOPHAGE-XR) 500 MG 24 hr tablet; TAKE 1 TABLET BY MOUTH ONCE DAILY IN THE EVENING WITH MEALS  Dispense: 90 tablet; Refill: 1  4. Seizures  ([place]) Report any seizure activity - phenytoin (DILANTIN) 100 MG ER capsule; Take 3 capsules (300 mg total) by mouth at bedtime.  Dispense: 270 capsule; Refill: 1  5. Vitamin D deficiency Continue daily vitamin d   6. Morbid obesity ([place])  Discussed diet and exercise for person with BMI >25 Will recheck weight in 3-6 months    Labs pending Health Maintenance reviewed Diet and exercise encouraged  Follow up plan: 6 months   [name] Done, FNP

## 2020-12-19 LAB — MICROALBUMIN / CREATININE URINE RATIO
Creatinine, Urine: 253 mg/dL
Microalb/Creat Ratio: 31 mg/g creat — ABNORMAL HIGH (ref 0–29)
Microalbumin, Urine: 78.8 ug/mL

## 2021-02-05 ENCOUNTER — Other Ambulatory Visit: Payer: Self-pay | Admitting: Nurse Practitioner

## 2021-02-05 DIAGNOSIS — I10 Essential (primary) hypertension: Secondary | ICD-10-CM

## 2021-02-15 IMAGING — DX DG CHEST 2V
2 series · 2 of 2 positions shown · non-contrast
Comparison: September 26, 2014

CLINICAL DATA: Tobacco use.  Hypertension.

EXAM:
CHEST - 2 VIEW

[chest pa]
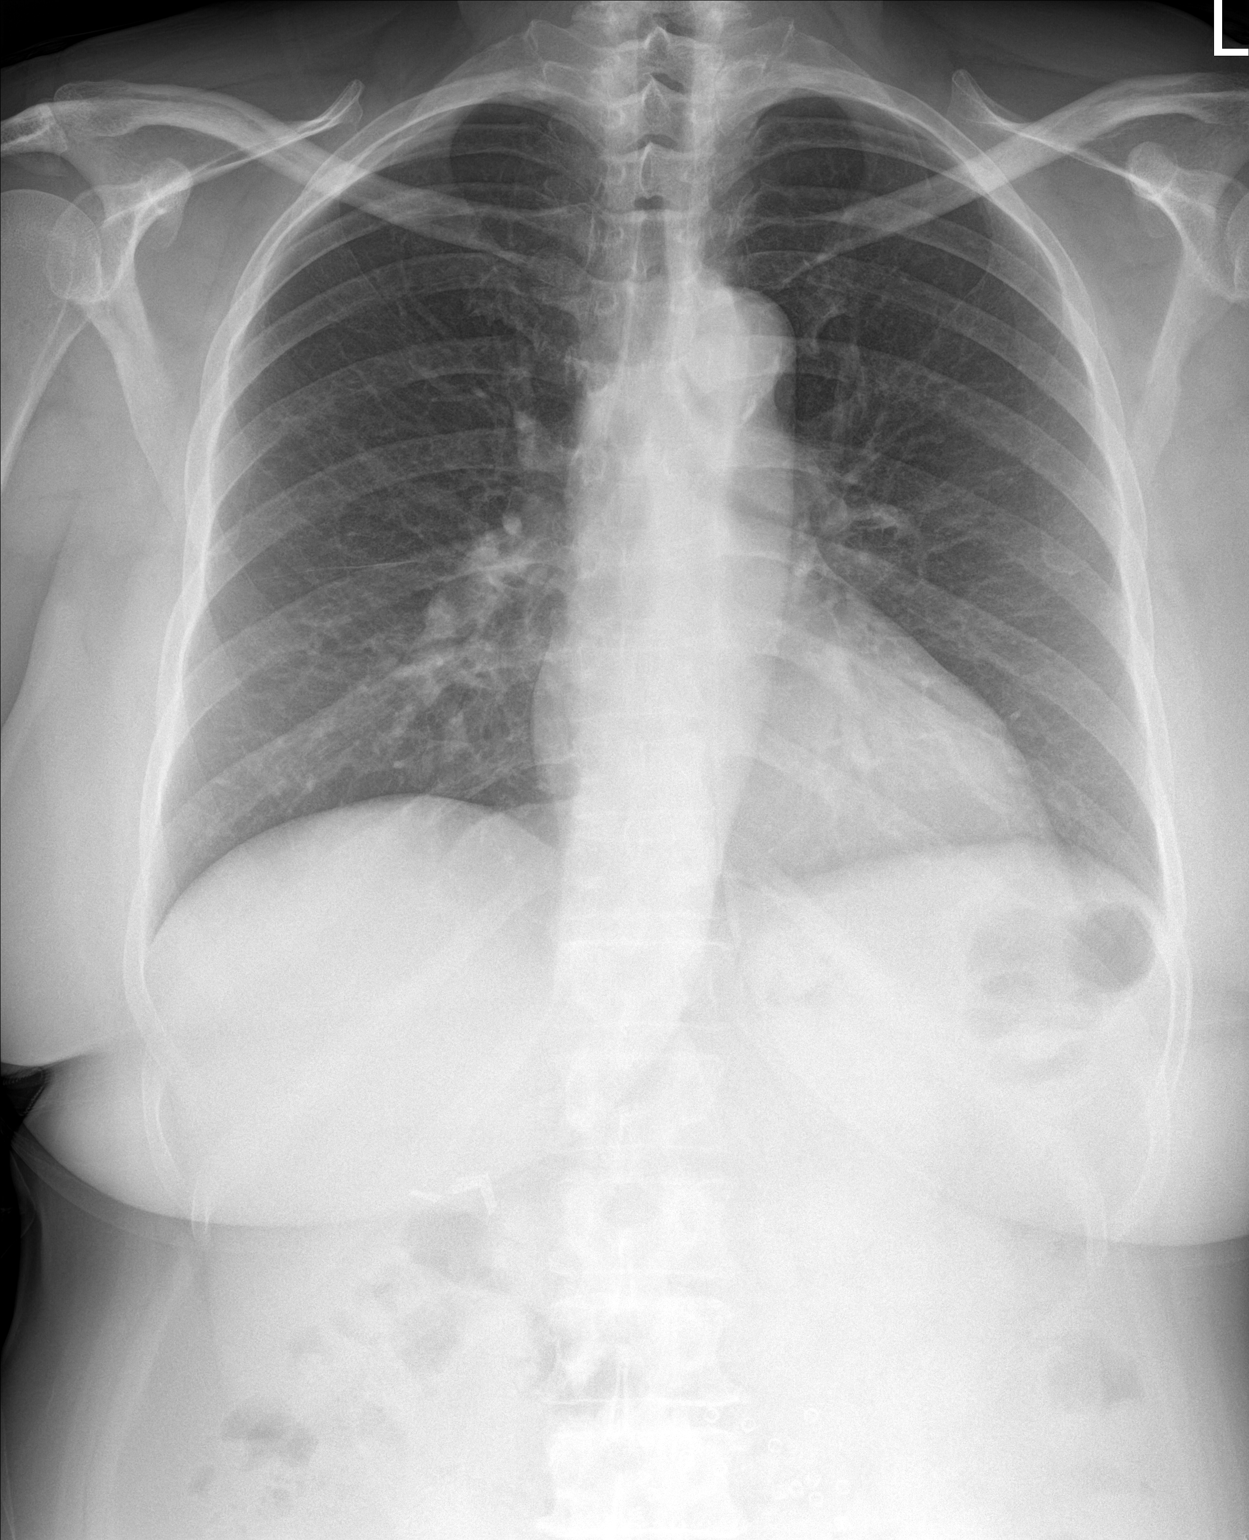

[chest lat]
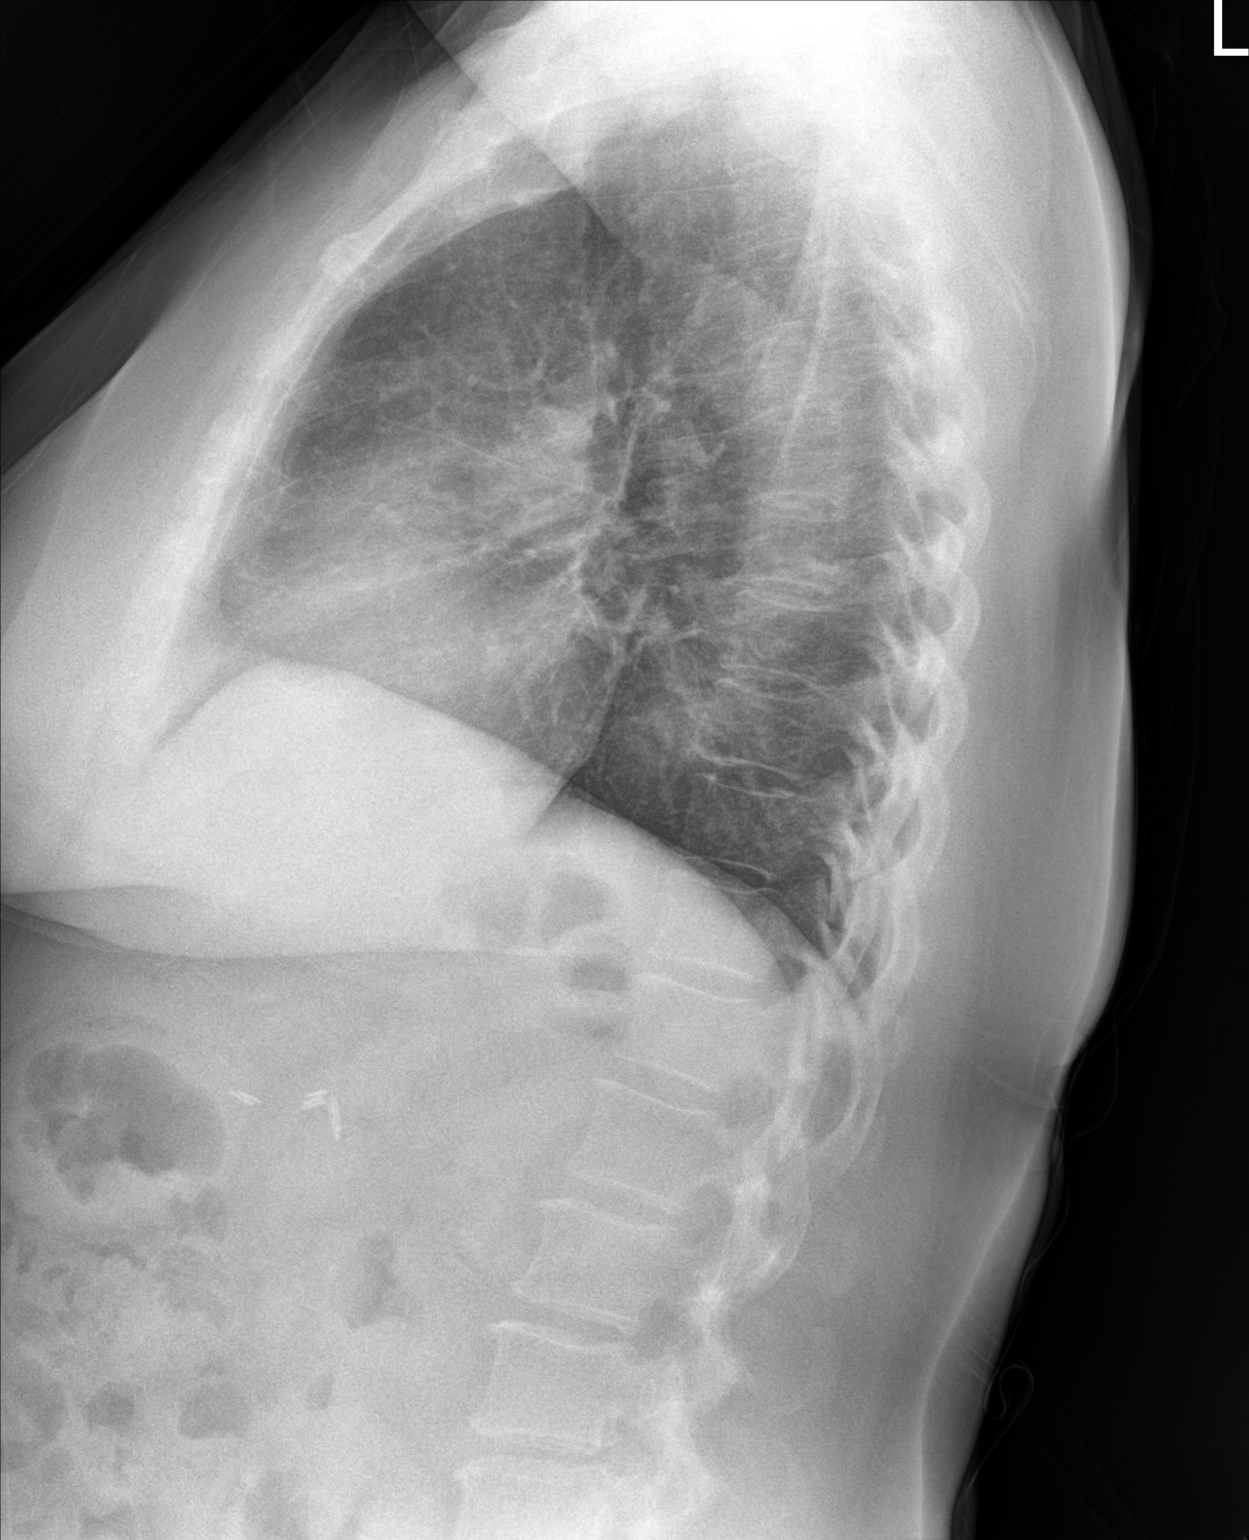

[2 of 2 positions shown; findings below may reference images not displayed]

FINDINGS: Lungs are clear. Heart size and pulmonary vascularity are normal. No
adenopathy. There is aortic atherosclerosis. No bone lesions. There
are surgical clips in the right upper abdomen.
IMPRESSION: Lungs clear. Cardiac silhouette normal. Aortic Atherosclerosis
([id number]).

## 2021-05-08 DIAGNOSIS — L509 Urticaria, unspecified: Secondary | ICD-10-CM | POA: Diagnosis not present

## 2021-05-08 DIAGNOSIS — Z6828 Body mass index (BMI) 28.0-28.9, adult: Secondary | ICD-10-CM | POA: Diagnosis not present

## 2021-05-08 DIAGNOSIS — R21 Rash and other nonspecific skin eruption: Secondary | ICD-10-CM | POA: Diagnosis not present

## 2021-05-16 ENCOUNTER — Ambulatory Visit: Payer: BC Managed Care – PPO | Admitting: Family

## 2021-05-16 ENCOUNTER — Encounter: Payer: Self-pay | Admitting: Family

## 2021-05-16 VITALS — BP 158/82 | HR 82 | Temp 98.0°F | Ht 64.0 in | Wt 162.0 lb

## 2021-05-16 DIAGNOSIS — R21 Rash and other nonspecific skin eruption: Secondary | ICD-10-CM

## 2021-05-16 DIAGNOSIS — W57XXXD Bitten or stung by nonvenomous insect and other nonvenomous arthropods, subsequent encounter: Secondary | ICD-10-CM | POA: Diagnosis not present

## 2021-05-16 MED ORDER — HYDROXYZINE PAMOATE 25 MG PO CAPS: 25.0000 mg | ORAL_CAPSULE | Freq: Three times a day (TID) | ORAL | 1 refills | Status: DC | PRN

## 2021-05-16 MED ORDER — TRIAMCINOLONE ACETONIDE 0.5 % EX OINT
1.0000 | TOPICAL_OINTMENT | Freq: Two times a day (BID) | CUTANEOUS | 0 refills | Status: DC
Start: 2021-05-16 — End: 2022-01-06

## 2021-05-16 NOTE — Patient Instructions (Signed)
Insect Bite, Adult An insect bite can make your skin red, itchy, and swollen. An insect bite is different from an insect sting, which happens when an insect injects poison (venom) into the skin. Some insects can spread disease to people through a bite. However, most insect bites do not lead to disease and are not serious. What are the causes? Insects may bite for a variety of reasons, including: Hunger. To defend themselves. Insects that bite include: Spiders. Mosquitoes. Ticks. Fleas. Ants. Flies. Kissing bugs. Chiggers. What are the signs or symptoms? Symptoms of this condition include: Itching or pain in the bite area. Redness and swelling in the bite area. An open wound (skin ulcer). In many cases, symptoms last for 2-4 days. In rare cases, a person may have a severe allergic reaction (anaphylactic reaction) to a bite. Symptoms of an anaphylactic reaction may include: Feeling warm in the face (flushed). This may include redness. Itchy, red, swollen areas of skin (hives). Swelling of the eyes, lips, face, mouth, tongue, or throat. Difficulty breathing, speaking, or swallowing. Noisy breathing (wheezing). Dizziness or light-headedness. Fainting. Pain or cramping in the abdomen. Vomiting. Diarrhea. How is this diagnosed? This condition is usually diagnosed based on symptoms and a physical exam. How is this treated? Treatment is usually not needed. Symptoms often go away on their own. When treatment is recommended, it may involve: Applying a cream or lotion to the bite area. This treatment helps with itching. Taking an antibiotic medicine. This treatment is needed if the bite area gets infected. Getting a tetanus shot, if you are not up to date on this vaccine. Applying ice to the affected area. Allergy medicines called antihistamines. This treatment may be needed if you develop itching or an allergic reaction to the insect bite. Giving yourself an epinephrine injection if  you have an anaphylactic reaction to a bite. To give the injection, you will use what is commonly called an auto-injector "pen" (pre-filled automatic epinephrine injection device). Your health care provider will teach you how to use an auto-injector pen. Follow these instructions at home: Bite area care  Do not scratch the bite area. Keep the bite area clean and dry. Wash it every day with soap and water as told by your health care provider. Check the bite area every day for signs of infection. Check for: Redness, swelling, or pain. Fluid or blood. Warmth. Pus or a bad smell. Managing pain, itching, and swelling  You may apply cortisone cream, calamine lotion, or a paste made of baking soda and water to the bite area as told by your health care provider. If directed, put ice on the bite area. Put ice in a plastic bag. Place a towel between your skin and the bag. Leave the ice on for 20 minutes, 2-3 times a day. General instructions Apply or take over-the-counter and prescription medicines only as told by your health care provider. If you were prescribed an antibiotic medicine, take or apply it as told by your health care provider. Do not stop using the antibiotic even if your condition improves. Keep all follow-up visits as told by your health care provider. This is important. How is this prevented? To help reduce your risk of insect bites: When you are outdoors, wear clothing that covers your arms and legs. This is especially important in the early morning and evening. Use insect repellent. The best insect repellents contain DEET, picaridin, oil of lemon eucalyptus (OLE), or IR3535. Consider spraying your clothing with a pesticide called permethrin. Permethrin  helps prevent insect bites. It works for several weeks and for up to 5-6 clothing washes. Do not apply permethrin directly to the skin. If your home windows do not have screens, consider installing them. If you will be sleeping in  an area where there are mosquitoes, consider covering your sleeping area with a mosquito net. Contact a health care provider if: You have redness, swelling, or pain in the bite area. You have fluid or blood coming from the bite area. The bite area feels warm to the touch. You have pus or a bad smell coming from the bite area. You have a fever. Get help right away if: You have joint pain. You have a rash. You feel unusually tired or sleepy. You have neck pain. You have a headache. You have unusual weakness. You develop symptoms of an anaphylactic reaction. These may include: Flushed skin. Hives. Swelling of the eyes, lips, face, mouth, tongue, or throat. Difficulty breathing, speaking, or swallowing. Wheezing. Dizziness or light-headedness. Fainting. Pain or cramping in the abdomen. Vomiting. Diarrhea. These symptoms may represent a serious problem that is an emergency. Do not wait to see if the symptoms will go away. Do the following right away: Use the auto-injector pen as you have been instructed. Get medical help. Call your local emergency services (911 in the [place] Do not drive yourself to the hospital. Summary An insect bite can make your skin red, itchy, and swollen. Treatment is usually not needed. Symptoms often go away on their own. When treatment is recommended, it may involve taking medicine, applying medicine to the area, or applying ice. Apply or take over-the-counter and prescription medicines only as told by your health care provider. Use insect repellent to help prevent insect bites. Contact a health care provider if you have any signs of infection in the bite area. This information is not intended to replace advice given to you by your health care provider. Make sure you discuss any questions you have with your health care provider. Document Revised: 09/17/2017 Document Reviewed: 09/18/2017 Elsevier Patient Education  [place].

## 2021-05-16 NOTE — Progress Notes (Signed)
Subjective:    Patient ID: Melanie Maynard, female    DOB: 08-07-1960, 61 y.o.   MRN: [id number]  Chief Complaint  Patient presents with   Urticaria    Over a week went to urgent care took prednisone dose pack still no better    Pt presents to the office today with a rash on bilateral arms and back. She went to the Urgent care and was diagnosed with urticaria and given prednisone. She reports she continues to itch. She reports the area is unchanged.   States her boyfriend who sleeps in the same bed does not have any rash. Denies any new medications, detergents.   Rash This is a new problem. The current episode started 1 to 4 weeks ago. The problem has been waxing and waning since onset. The affected locations include the left arm, back and right arm. The rash is characterized by itchiness and redness. Pertinent negatives include no congestion, cough, diarrhea, eye pain, facial edema, fever, rhinorrhea, shortness of breath or sore throat. Past treatments include oral steroids. The treatment provided mild relief.     Review of Systems  Constitutional:  Negative for fever.  HENT:  Negative for congestion, rhinorrhea and sore throat.   Eyes:  Negative for pain.  Respiratory:  Negative for cough and shortness of breath.   Gastrointestinal:  Negative for diarrhea.  Skin:  Positive for rash.  All other systems reviewed and are negative.     Objective:   Physical Exam Vitals reviewed.  Constitutional:      General: She is not in acute distress.    Appearance: She is well-developed.  HENT:     Head: Normocephalic and atraumatic.     Right Ear: Tympanic membrane normal.     Left Ear: Tympanic membrane normal.  Eyes:     Pupils: Pupils are equal, round, and reactive to light.  Neck:     Thyroid: No thyromegaly.  Cardiovascular:     Rate and Rhythm: Normal rate and regular rhythm.     Heart sounds: Normal heart sounds. No murmur heard. Pulmonary:     Effort: Pulmonary effort is  normal. No respiratory distress.     Breath sounds: Normal breath sounds. No wheezing.  Abdominal:     General: Bowel sounds are normal. There is no distension.     Palpations: Abdomen is soft.     Tenderness: There is no abdominal tenderness.  Musculoskeletal:        General: No tenderness. Normal range of motion.     Cervical back: Normal range of motion and neck supple.  Skin:    General: Skin is warm and dry.     Findings: Rash present.       Neurological:     Mental Status: She is alert and oriented to person, place, and time.     Cranial Nerves: No cranial nerve deficit.     Deep Tendon Reflexes: Reflexes are normal and symmetric.  Psychiatric:        Behavior: Behavior normal.        Thought Content: Thought content normal.        Judgment: Judgment normal.     BP (!) 158/82    Pulse 82    Temp 98 F (36.7 C) (Temporal)    Ht 5\' 4"  (1.626 m)    Wt 162 lb (73.5 kg)    LMP 01/23/2001 (Approximate)    BMI 27.81 kg/m       Assessment & Plan:  Melanie Maynard comes in today with chief complaint of Urticaria (Over a week went to urgent care took prednisone dose pack still no better/)   Diagnosis and orders addressed:  1. Rash and nonspecific skin eruption  - hydrOXYzine (VISTARIL) 25 MG capsule; Take 1 capsule (25 mg total) by mouth every 8 (eight) hours as needed.  Dispense: 90 capsule; Refill: 1 - triamcinolone ointment (KENALOG) 0.5 %; Apply 1 application topically 2 (two) times daily.  Dispense: 30 g; Refill: 0  2. Insect bite, unspecified site, subsequent encounter - hydrOXYzine (VISTARIL) 25 MG capsule; Take 1 capsule (25 mg total) by mouth every 8 (eight) hours as needed.  Dispense: 90 capsule; Refill: 1 - triamcinolone ointment (KENALOG) 0.5 %; Apply 1 application topically 2 (two) times daily.  Dispense: 30 g; Refill: 0   Vistaril 25 mg TID prn  Kenalog  Look at mattress, wash sheets, make sure dog is up to date on flea medication.   Evelina Dun,  FNP

## 2021-05-30 ENCOUNTER — Other Ambulatory Visit: Payer: Self-pay | Admitting: Nurse Practitioner

## 2021-05-30 DIAGNOSIS — E785 Hyperlipidemia, unspecified: Secondary | ICD-10-CM

## 2021-06-06 ENCOUNTER — Other Ambulatory Visit: Payer: Self-pay | Admitting: Nurse Practitioner

## 2021-06-06 DIAGNOSIS — R569 Unspecified convulsions: Secondary | ICD-10-CM

## 2021-06-10 ENCOUNTER — Telehealth: Payer: Self-pay | Admitting: Nurse Practitioner

## 2021-06-10 DIAGNOSIS — R21 Rash and other nonspecific skin eruption: Secondary | ICD-10-CM

## 2021-06-10 NOTE — Telephone Encounter (Signed)
That is a steroid cream- if not helping may need to see dermatology- would you like me to do referral. ?

## 2021-06-10 NOTE — Telephone Encounter (Signed)
Please review and advise.

## 2021-06-11 NOTE — Addendum Note (Signed)
Addended by: Chevis Pretty on: 06/11/2021 01:23 PM ? ? Modules accepted: Orders ? ?

## 2021-06-11 NOTE — Telephone Encounter (Signed)
Dermatology referral done

## 2021-06-11 NOTE — Telephone Encounter (Signed)
Patient would like for you to go ahead with a referral to the dermatologist. ?

## 2021-06-17 ENCOUNTER — Encounter: Payer: Self-pay | Admitting: Nurse Practitioner

## 2021-06-17 ENCOUNTER — Ambulatory Visit: Payer: BC Managed Care – PPO | Admitting: Nurse Practitioner

## 2021-06-17 VITALS — BP 140/86 | HR 70 | Temp 98.0°F | Resp 20 | Ht 64.0 in | Wt 160.0 lb

## 2021-06-17 DIAGNOSIS — R569 Unspecified convulsions: Secondary | ICD-10-CM | POA: Diagnosis not present

## 2021-06-17 DIAGNOSIS — E1141 Type 2 diabetes mellitus with diabetic mononeuropathy: Secondary | ICD-10-CM

## 2021-06-17 DIAGNOSIS — Z6827 Body mass index (BMI) 27.0-27.9, adult: Secondary | ICD-10-CM

## 2021-06-17 DIAGNOSIS — I10 Essential (primary) hypertension: Secondary | ICD-10-CM | POA: Diagnosis not present

## 2021-06-17 DIAGNOSIS — E559 Vitamin D deficiency, unspecified: Secondary | ICD-10-CM

## 2021-06-17 DIAGNOSIS — Z1211 Encounter for screening for malignant neoplasm of colon: Secondary | ICD-10-CM

## 2021-06-17 DIAGNOSIS — E785 Hyperlipidemia, unspecified: Secondary | ICD-10-CM

## 2021-06-17 DIAGNOSIS — Z1212 Encounter for screening for malignant neoplasm of rectum: Secondary | ICD-10-CM

## 2021-06-17 LAB — BAYER DCA HB A1C WAIVED: HB A1C (BAYER DCA - WAIVED): 5.7 % — ABNORMAL HIGH (ref 4.8–5.6)

## 2021-06-17 MED ORDER — ATORVASTATIN CALCIUM 40 MG PO TABS: 40.0000 mg | ORAL_TABLET | Freq: Every day | ORAL | 1 refills | Status: DC

## 2021-06-17 MED ORDER — PHENYTOIN SODIUM EXTENDED 100 MG PO CAPS: 300.0000 mg | ORAL_CAPSULE | Freq: Every day | ORAL | 1 refills | Status: DC

## 2021-06-17 MED ORDER — LOSARTAN POTASSIUM 100 MG PO TABS: 100.0000 mg | ORAL_TABLET | Freq: Every day | ORAL | 1 refills | Status: DC

## 2021-06-17 NOTE — Progress Notes (Signed)
? ?Subjective:  ? ? Patient ID: Melanie Maynard, female    DOB: [birth year], 61 y.o.   MRN: [id number] ? ?Chief Complaint: medical management of chronic issues  ? ?  ? ?HPI: ? ?Melanie Maynard is a 62 y.o. who identifies as a female who was assigned female at birth.  ? ?Social history: ?Lives with: boyfriend ?Work history: [employer] ? ? ?Comes in today for follow up of the following chronic medical issues: ? ?1. Essential hypertension ?No c/o chest pai, sob or headache. Doesnot check blood presure at home. ?BP Readings from Last 3 Encounters:  ?06/17/21 140/86  ?05/16/21 (!) 158/82  ?12/18/20 (!) 145/92  ? ? ? ? ?2. Hyperlipidemia with target LDL less than 100 ?Doe snot watch diet and does not dedicated exrcise. ?Lab Results  ?Component Value Date  ? CHOL 166 12/18/2020  ? HDL 59 12/18/2020  ? Melanie Maynard 90 12/18/2020  ? TRIG 94 12/18/2020  ? CHOLHDL 2.8 12/18/2020  ? ?The 10-year ASCVD risk score (Arnett DK, et al., 2019) is: 29.8% ? ? ?3. Type 2 diabetes mellitus with diabetic mononeuropathy, without long-term current use of insulin ([place]) ?She doe snot check her blood sugars at home. Does not really watch her diet either. ?Lab Results  ?Component Value Date  ? HGBA1C 6.0 (H) 12/18/2020  ? ? ? ?4. Seizures ([place]) ?Melanie Maynard snot had in years ? ?5. Vitamin D deficiency ?Is not taking vitamin d supplement ? ?6. Morbid obesity ([place]) ?No recent weight changes ?Wt Readings from Last 3 Encounters:  ?06/17/21 160 lb (72.6 kg)  ?05/16/21 162 lb (73.5 kg)  ?12/18/20 164 lb (74.4 kg)  ? ?BMI Readings from Last 3 Encounters:  ?06/17/21 27.46 kg/m?  ?05/16/21 27.81 kg/m?  ?12/18/20 28.15 kg/m?  ? ? ? ?New complaints: ?None today ? ?Allergies  ?Allergen Reactions  ? Gabapentin Other (See Comments)  ?  Chest pain  ? ?Outpatient Encounter Medications as of 06/17/2021  ?Medication Sig  ? amLODipine (NORVASC) 10 MG tablet Take 1 tablet by mouth once daily  ? atorvastatin (LIPITOR) 40 MG tablet Take 1 tablet by mouth once daily  ? glucose blood  (ONETOUCH VERIO) test strip Test 1x per day and prn  Dx e11.9  ? hydrOXYzine (VISTARIL) 25 MG capsule Take 1 capsule (25 mg total) by mouth every 8 (eight) hours as needed.  ? losartan (COZAAR) 100 MG tablet Take 1 tablet by mouth once daily  ? metFORMIN (GLUCOPHAGE-XR) 500 MG 24 hr tablet TAKE 1 TABLET BY MOUTH ONCE DAILY IN THE EVENING WITH MEALS  ? ONETOUCH DELICA LANCETS 40J MISC 1 each by Does not apply route daily. Test 1x per day and prn  Dx E11.9  ? phenytoin (DILANTIN) 100 MG ER capsule TAKE 3 CAPSULES BY MOUTH AT BEDTIME  ? triamcinolone ointment (KENALOG) 0.5 % Apply 1 application topically 2 (two) times daily.  ? ?No facility-administered encounter medications on file as of 06/17/2021.  ? ? ?Past Surgical History:  ?Procedure Laterality Date  ? ABDOMINAL HYSTERECTOMY    ? APPENDECTOMY    ? CATARACT EXTRACTION W/PHACO Right 09/26/2016  ? Procedure: CATARACT EXTRACTION PHACO AND INTRAOCULAR LENS PLACEMENT (IOC);  Surgeon: Baruch Goldmann, MD;  Location: [facility];  Service: Ophthalmology;  Laterality: Right;  CDE: 3.16  ? CHOLECYSTECTOMY    ? ? ?Family History  ?Problem Relation Age of Onset  ? Diabetes Mother   ? Heart attack Mother 60  ? Healthy Father   ? Alcohol abuse Father   ?  Diabetes Sister   ? Cancer Sister   ?     breast  ? Cirrhosis Brother   ? Leukemia Sister   ? Diabetes Sister   ? Kidney disease Sister   ?     on dialysis  ? ? ? ? ?Controlled substance contract: n/a ? ? ? ? ?Review of Systems  ?Constitutional:  Negative for diaphoresis.  ?Eyes:  Negative for pain.  ?Respiratory:  Negative for shortness of breath.   ?Cardiovascular:  Negative for chest pain, palpitations and leg swelling.  ?Gastrointestinal:  Negative for abdominal pain.  ?Endocrine: Negative for polydipsia.  ?Skin:  Negative for rash.  ?Neurological:  Negative for dizziness, weakness and headaches.  ?Hematological:  Does not bruise/bleed easily.  ?All other systems reviewed and are negative. ? ?   ?Objective:  ? Physical  Exam ?Vitals and nursing note reviewed.  ?Constitutional:   ?   General: She is not in acute distress. ?   Appearance: Normal appearance. She is well-developed.  ?HENT:  ?   Head: Normocephalic.  ?   Right Ear: Tympanic membrane normal.  ?   Left Ear: Tympanic membrane normal.  ?   Nose: Nose normal.  ?   Mouth/Throat:  ?   Mouth: Mucous membranes are moist.  ?Eyes:  ?   Pupils: Pupils are equal, round, and reactive to light.  ?Neck:  ?   Vascular: No carotid bruit or JVD.  ?Cardiovascular:  ?   Rate and Rhythm: Normal rate and regular rhythm.  ?   Heart sounds: Normal heart sounds.  ?Pulmonary:  ?   Effort: Pulmonary effort is normal. No respiratory distress.  ?   Breath sounds: Normal breath sounds. No wheezing or rales.  ?Chest:  ?   Chest wall: No tenderness.  ?Abdominal:  ?   General: Bowel sounds are normal. There is no distension or abdominal bruit.  ?   Palpations: Abdomen is soft. There is no hepatomegaly, splenomegaly, mass or pulsatile mass.  ?   Tenderness: There is no abdominal tenderness.  ?Musculoskeletal:     ?   General: Normal range of motion.  ?   Cervical back: Normal range of motion and neck supple.  ?Lymphadenopathy:  ?   Cervical: No cervical adenopathy.  ?Skin: ?   General: Skin is warm and dry.  ?Neurological:  ?   Mental Status: She is alert and oriented to person, place, and time.  ?   Deep Tendon Reflexes: Reflexes are normal and symmetric.  ?Psychiatric:     ?   Behavior: Behavior normal.     ?   Thought Content: Thought content normal.     ?   Judgment: Judgment normal.  ? ?BP 140/86   Pulse 70   Temp 98 ?F (36.7 ?C) (Temporal)   Resp 20   Ht '5\' 4"'  (1.626 m)   Wt 160 lb (72.6 kg)   LMP 01/23/2001 (Approximate)   SpO2 97%   BMI 27.46 kg/m?  ? ?Hgba1c 5.7% ? ? ?   ?Assessment & Plan:  ?Melanie Maynard in today with chief complaint of Medical Management of Chronic Issues (Bumps on arms and legs/) ? ? ?1. Essential hypertension ?Low sodium diet ?- CBC with Differential/Platelet ?-  CMP14+EGFR ?- losartan (COZAAR) 100 MG tablet; Take 1 tablet (100 mg total) by mouth daily.  Dispense: 90 tablet; Refill: 1 ? ?2. Hyperlipidemia with target LDL less than 100 ?Low fta diet ?- Lipid panel ?- atorvastatin (LIPITOR) 40 MG tablet;  Take 1 tablet (40 mg total) by mouth daily.  Dispense: 90 tablet; Refill: 1 ? ?3. Type 2 diabetes mellitus with diabetic mononeuropathy, without long-term current use of insulin ([place]) ?Continue to watch carbs in diet ?Exercise ?Ok to hold metformin for now. ?- Bayer DCA Hb A1c Waived ? ?4. Seizures ([place]) ?Report any seizure activity ?- phenytoin (DILANTIN) 100 MG ER capsule; Take 3 capsules (300 mg total) by mouth at bedtime.  Dispense: 270 capsule; Refill: 1 ? ?5. Vitamin D deficiency ?Labs pending ?- VITAMIN D 25 Hydroxy (Vit-D Deficiency, Fractures) ? ?6. BMI 27.0-27.9,adult ?Discussed diet and exercise for person with BMI >25 ?Will recheck weight in 3-6 months ? ? ?7. Encounter for screening for colorectal malignant neoplasm ?Will be mailed to her ?- Cologuard ? ? ? ?Patient will schedule PAP soon ? ?[name] Done, FNP ? ? ? ? ?

## 2021-06-17 NOTE — Patient Instructions (Signed)

## 2021-06-18 LAB — LIPID PANEL
Chol/HDL Ratio: 2.5 ratio (ref 0.0–4.4)
Cholesterol, Total: 151 mg/dL (ref 100–199)
HDL: 61 mg/dL (ref >39)
LDL Chol Calc (NIH): 73 mg/dL (ref 0–99)
Triglycerides: 89 mg/dL (ref 0–149)
VLDL Cholesterol Cal: 17 mg/dL (ref 5–40)

## 2021-06-18 LAB — CMP14+EGFR
ALT: 9 IU/L (ref 0–32)
AST: 16 IU/L (ref 0–40)
Albumin/Globulin Ratio: 1.5 (ref 1.2–2.2)
Albumin: 3.9 g/dL (ref 3.8–4.9)
Alkaline Phosphatase: 206 IU/L — ABNORMAL HIGH (ref 44–121)
BUN/Creatinine Ratio: 22 (ref 12–28)
BUN: 18 mg/dL (ref 8–27)
Bilirubin Total: <0.2 mg/dL (ref 0.0–1.2)
CO2: 22 mmol/L (ref 20–29)
Calcium: 8.6 mg/dL — ABNORMAL LOW (ref 8.7–10.3)
Chloride: 102 mmol/L (ref 96–106)
Creatinine, Ser: 0.81 mg/dL (ref 0.57–1.00)
Globulin, Total: 2.6 g/dL (ref 1.5–4.5)
Glucose: 84 mg/dL (ref 70–99)
Potassium: 3.9 mmol/L (ref 3.5–5.2)
Sodium: 137 mmol/L (ref 134–144)
Total Protein: 6.5 g/dL (ref 6.0–8.5)
eGFR: 83 mL/min/{1.73_m2} (ref >59)

## 2021-06-18 LAB — CBC WITH DIFFERENTIAL/PLATELET
Basophils Absolute: 0.1 10*3/uL (ref 0.0–0.2)
Basos: 1 %
EOS (ABSOLUTE): 0.5 10*3/uL — ABNORMAL HIGH (ref 0.0–0.4)
Eos: 5 %
Hematocrit: 38.2 % (ref 34.0–46.6)
Hemoglobin: 13.2 g/dL (ref 11.1–15.9)
Immature Grans (Abs): 0 10*3/uL (ref 0.0–0.1)
Immature Granulocytes: 0 %
Lymphocytes Absolute: 4 10*3/uL — ABNORMAL HIGH (ref 0.7–3.1)
Lymphs: 41 %
MCH: 26.8 pg (ref 26.6–33.0)
MCHC: 34.6 g/dL (ref 31.5–35.7)
MCV: 78 fL — ABNORMAL LOW (ref 79–97)
Monocytes Absolute: 0.5 10*3/uL (ref 0.1–0.9)
Monocytes: 5 %
Neutrophils Absolute: 4.7 10*3/uL (ref 1.4–7.0)
Neutrophils: 48 %
Platelets: 274 10*3/uL (ref 150–450)
RBC: 4.93 x10E6/uL (ref 3.77–5.28)
RDW: 14.2 % (ref 11.7–15.4)
WBC: 9.8 10*3/uL (ref 3.4–10.8)

## 2021-06-18 LAB — VITAMIN D 25 HYDROXY (VIT D DEFICIENCY, FRACTURES): Vit D, 25-Hydroxy: 8.8 ng/mL — ABNORMAL LOW (ref 30.0–100.0)

## 2021-06-18 MED ORDER — VITAMIN D (ERGOCALCIFEROL) 1.25 MG (50000 UNIT) PO CAPS: 50000.0000 [IU] | ORAL_CAPSULE | ORAL | 1 refills | Status: DC

## 2021-06-18 NOTE — Addendum Note (Signed)
Addended by: Chevis Pretty on: 06/18/2021 01:36 PM ? ? Modules accepted: Orders ? ?

## 2021-07-16 DIAGNOSIS — Z1211 Encounter for screening for malignant neoplasm of colon: Secondary | ICD-10-CM | POA: Diagnosis not present

## 2021-07-16 DIAGNOSIS — Z1212 Encounter for screening for malignant neoplasm of rectum: Secondary | ICD-10-CM | POA: Diagnosis not present

## 2021-07-25 LAB — COLOGUARD: COLOGUARD: POSITIVE — AB

## 2021-07-26 ENCOUNTER — Other Ambulatory Visit: Payer: Self-pay | Admitting: Nurse Practitioner

## 2021-07-26 DIAGNOSIS — R195 Other fecal abnormalities: Secondary | ICD-10-CM

## 2021-07-26 NOTE — Progress Notes (Signed)
Gi referral 

## 2021-07-29 DIAGNOSIS — B86 Scabies: Secondary | ICD-10-CM | POA: Diagnosis not present

## 2021-07-31 DIAGNOSIS — H5210 Myopia, unspecified eye: Secondary | ICD-10-CM | POA: Diagnosis not present

## 2021-07-31 LAB — HM DIABETES EYE EXAM

## 2021-08-05 DIAGNOSIS — Z1211 Encounter for screening for malignant neoplasm of colon: Secondary | ICD-10-CM | POA: Diagnosis not present

## 2021-09-17 ENCOUNTER — Encounter: Payer: Self-pay | Admitting: Nurse Practitioner

## 2021-09-17 ENCOUNTER — Ambulatory Visit: Payer: BC Managed Care – PPO | Admitting: Nurse Practitioner

## 2021-09-17 VITALS — BP 134/92 | HR 75 | Temp 97.2°F | Resp 20 | Ht 64.0 in | Wt 159.0 lb

## 2021-09-17 DIAGNOSIS — E785 Hyperlipidemia, unspecified: Secondary | ICD-10-CM | POA: Diagnosis not present

## 2021-09-17 DIAGNOSIS — R569 Unspecified convulsions: Secondary | ICD-10-CM | POA: Diagnosis not present

## 2021-09-17 DIAGNOSIS — E559 Vitamin D deficiency, unspecified: Secondary | ICD-10-CM

## 2021-09-17 DIAGNOSIS — E119 Type 2 diabetes mellitus without complications: Secondary | ICD-10-CM

## 2021-09-17 DIAGNOSIS — I10 Essential (primary) hypertension: Secondary | ICD-10-CM | POA: Diagnosis not present

## 2021-09-17 LAB — BAYER DCA HB A1C WAIVED: HB A1C (BAYER DCA - WAIVED): 6 % — ABNORMAL HIGH (ref 4.8–5.6)

## 2021-09-17 MED ORDER — AMLODIPINE BESYLATE 10 MG PO TABS: 10.0000 mg | ORAL_TABLET | Freq: Every day | ORAL | 1 refills | Status: DC

## 2021-09-17 MED ORDER — LOSARTAN POTASSIUM 100 MG PO TABS: 100.0000 mg | ORAL_TABLET | Freq: Every day | ORAL | 1 refills | Status: DC

## 2021-09-17 MED ORDER — ATORVASTATIN CALCIUM 40 MG PO TABS: 40.0000 mg | ORAL_TABLET | Freq: Every day | ORAL | 1 refills | Status: DC

## 2021-09-18 LAB — CBC WITH DIFFERENTIAL/PLATELET
Basophils Absolute: 0.1 10*3/uL (ref 0.0–0.2)
Basos: 1 %
EOS (ABSOLUTE): 0.4 10*3/uL (ref 0.0–0.4)
Eos: 5 %
Hematocrit: 41.2 % (ref 34.0–46.6)
Hemoglobin: 13.5 g/dL (ref 11.1–15.9)
Immature Grans (Abs): 0 10*3/uL (ref 0.0–0.1)
Immature Granulocytes: 0 %
Lymphocytes Absolute: 3.5 10*3/uL — ABNORMAL HIGH (ref 0.7–3.1)
Lymphs: 41 %
MCH: 26.1 pg — ABNORMAL LOW (ref 26.6–33.0)
MCHC: 32.8 g/dL (ref 31.5–35.7)
MCV: 80 fL (ref 79–97)
Monocytes Absolute: 0.4 10*3/uL (ref 0.1–0.9)
Monocytes: 5 %
Neutrophils Absolute: 4.1 10*3/uL (ref 1.4–7.0)
Neutrophils: 48 %
Platelets: 258 10*3/uL (ref 150–450)
RBC: 5.17 x10E6/uL (ref 3.77–5.28)
RDW: 14.7 % (ref 11.7–15.4)
WBC: 8.4 10*3/uL (ref 3.4–10.8)

## 2021-09-18 LAB — CMP14+EGFR
ALT: 9 IU/L (ref 0–32)
AST: 12 IU/L (ref 0–40)
Albumin/Globulin Ratio: 1.4 (ref 1.2–2.2)
Albumin: 4 g/dL (ref 3.8–4.8)
Alkaline Phosphatase: 217 IU/L — ABNORMAL HIGH (ref 44–121)
BUN/Creatinine Ratio: 17 (ref 12–28)
BUN: 14 mg/dL (ref 8–27)
Bilirubin Total: 0.3 mg/dL (ref 0.0–1.2)
CO2: 23 mmol/L (ref 20–29)
Calcium: 8.9 mg/dL (ref 8.7–10.3)
Chloride: 100 mmol/L (ref 96–106)
Creatinine, Ser: 0.81 mg/dL (ref 0.57–1.00)
Globulin, Total: 2.9 g/dL (ref 1.5–4.5)
Glucose: 121 mg/dL — ABNORMAL HIGH (ref 70–99)
Potassium: 3.8 mmol/L (ref 3.5–5.2)
Sodium: 140 mmol/L (ref 134–144)
Total Protein: 6.9 g/dL (ref 6.0–8.5)
eGFR: 83 mL/min/{1.73_m2} (ref >59)

## 2021-09-18 LAB — LIPID PANEL
Chol/HDL Ratio: 2.8 ratio (ref 0.0–4.4)
Cholesterol, Total: 158 mg/dL (ref 100–199)
HDL: 57 mg/dL (ref >39)
LDL Chol Calc (NIH): 83 mg/dL (ref 0–99)
Triglycerides: 97 mg/dL (ref 0–149)
VLDL Cholesterol Cal: 18 mg/dL (ref 5–40)

## 2021-11-28 ENCOUNTER — Other Ambulatory Visit: Payer: Self-pay | Admitting: Nurse Practitioner

## 2022-01-06 ENCOUNTER — Encounter: Payer: Self-pay | Admitting: Podiatry

## 2022-01-06 ENCOUNTER — Ambulatory Visit: Payer: BC Managed Care – PPO | Admitting: Podiatry

## 2022-01-06 ENCOUNTER — Ambulatory Visit (INDEPENDENT_AMBULATORY_CARE_PROVIDER_SITE_OTHER): Payer: BC Managed Care – PPO

## 2022-01-06 ENCOUNTER — Other Ambulatory Visit: Payer: Self-pay | Admitting: Podiatry

## 2022-01-06 DIAGNOSIS — G5782 Other specified mononeuropathies of left lower limb: Secondary | ICD-10-CM | POA: Diagnosis not present

## 2022-01-06 DIAGNOSIS — B351 Tinea unguium: Secondary | ICD-10-CM | POA: Diagnosis not present

## 2022-01-06 DIAGNOSIS — D3613 Benign neoplasm of peripheral nerves and autonomic nervous system of lower limb, including hip: Secondary | ICD-10-CM | POA: Diagnosis not present

## 2022-01-06 DIAGNOSIS — M79676 Pain in unspecified toe(s): Secondary | ICD-10-CM

## 2022-01-06 DIAGNOSIS — M778 Other enthesopathies, not elsewhere classified: Secondary | ICD-10-CM

## 2022-01-06 MED ORDER — TRIAMCINOLONE ACETONIDE 40 MG/ML IJ SUSP
20.0000 mg | Freq: Once | INTRAMUSCULAR | Status: AC
  Administered 2022-01-06: 20 mg

## 2022-01-06 NOTE — Progress Notes (Signed)
  Subjective:  Patient ID: Melanie Maynard, female    DOB: [birth year],  MRN: [id number] HPI Chief Complaint  Patient presents with   Foot Pain    Forefoot/toes left - sharp, shooting pain x several months, no injury, radiates into anterior ankle sometimes,no treatment   Debridement    Trim toenails   New Patient (Initial Visit)    61 y.o. female presents with the above complaint.   ROS: Denies fever chills nausea vomiting muscle aches pains calf pain back pain chest pain shortness of breath.  Past Medical History:  Diagnosis Date   Diabetes mellitus without complication ([place])    Hypertension    Seizures ([place])    Past Surgical History:  Procedure Laterality Date   ABDOMINAL HYSTERECTOMY     APPENDECTOMY     CATARACT EXTRACTION W/PHACO Right 09/26/2016   Procedure: CATARACT EXTRACTION PHACO AND INTRAOCULAR LENS PLACEMENT (IOC);  Surgeon: Baruch Goldmann, MD;  Location: [facility];  Service: Ophthalmology;  Laterality: Right;  CDE: 3.16   CHOLECYSTECTOMY      Current Outpatient Medications:    amLODipine (NORVASC) 10 MG tablet, Take 1 tablet (10 mg total) by mouth daily., Disp: 90 tablet, Rfl: 1   atorvastatin (LIPITOR) 40 MG tablet, Take 1 tablet (40 mg total) by mouth daily., Disp: 90 tablet, Rfl: 1   losartan (COZAAR) 100 MG tablet, Take 1 tablet (100 mg total) by mouth daily., Disp: 90 tablet, Rfl: 1   phenytoin (DILANTIN) 100 MG ER capsule, Take 3 capsules (300 mg total) by mouth at bedtime., Disp: 270 capsule, Rfl: 1  Allergies  Allergen Reactions   Gabapentin Other (See Comments)    Chest pain   Review of Systems Objective:  There were no vitals filed for this visit.  General: Well developed, nourished, in no acute distress, alert and oriented x3   Dermatological: Skin is warm, dry and supple bilateral. Nails x 10 are long thick yellow dystrophic and clinically mycotic remaining integument appears unremarkable at this time. There are no open sores, no preulcerative lesions,  no rash or signs of infection present.  Vascular: Dorsalis Pedis artery and Posterior Tibial artery pedal pulses are 2/4 bilateral with immedate capillary fill time. Pedal hair growth present. No varicosities and no lower extremity edema present bilateral.   Neruologic: Grossly intact via light touch bilateral. Vibratory intact via tuning fork bilateral. Protective threshold with Semmes Wienstein monofilament intact to all pedal sites bilateral. Patellar and Achilles deep tendon reflexes 2+ bilateral. No Babinski or clonus noted bilateral.  Palpable Mulder's click third interspace left foot.  Musculoskeletal: No gross boney pedal deformities bilateral. No pain, crepitus, or limitation noted with foot and ankle range of motion bilateral. Muscular strength 5/5 in all groups tested bilateral.  Gait: Unassisted, Nonantalgic.    Radiographs:  Radiographs taken today demonstrate osseously mature individual with mild osteopenia.  No significant osseous abnormalities no fractures no acute findings.  Assessment & Plan:   Assessment: Diabetes mellitus without complications painful elongated toenails bilateral.  And neuroma third interspace left foot.  Plan: Debridement of toenails 1 through 5 bilateral.  Injected the third interspace left foot with 10 mg Kenalog 5 mg Marcaine.     Madyson Lukach T. [place], [place]

## 2022-02-19 ENCOUNTER — Other Ambulatory Visit: Payer: Self-pay | Admitting: Nurse Practitioner

## 2022-02-20 ENCOUNTER — Ambulatory Visit: Payer: BC Managed Care – PPO | Admitting: Podiatry

## 2022-03-02 ENCOUNTER — Other Ambulatory Visit: Payer: Self-pay | Admitting: Nurse Practitioner

## 2022-03-02 DIAGNOSIS — R569 Unspecified convulsions: Secondary | ICD-10-CM

## 2022-03-20 ENCOUNTER — Ambulatory Visit: Payer: BC Managed Care – PPO | Admitting: Nurse Practitioner

## 2022-03-20 ENCOUNTER — Encounter: Payer: Self-pay | Admitting: Nurse Practitioner

## 2022-03-20 VITALS — BP 124/81 | HR 74 | Temp 97.9°F | Resp 20 | Ht 64.0 in | Wt 167.0 lb

## 2022-03-20 DIAGNOSIS — E785 Hyperlipidemia, unspecified: Secondary | ICD-10-CM

## 2022-03-20 DIAGNOSIS — I10 Essential (primary) hypertension: Secondary | ICD-10-CM | POA: Diagnosis not present

## 2022-03-20 DIAGNOSIS — E559 Vitamin D deficiency, unspecified: Secondary | ICD-10-CM

## 2022-03-20 DIAGNOSIS — R569 Unspecified convulsions: Secondary | ICD-10-CM

## 2022-03-20 DIAGNOSIS — M792 Neuralgia and neuritis, unspecified: Secondary | ICD-10-CM

## 2022-03-20 DIAGNOSIS — E119 Type 2 diabetes mellitus without complications: Secondary | ICD-10-CM

## 2022-03-20 LAB — BAYER DCA HB A1C WAIVED: HB A1C (BAYER DCA - WAIVED): 6.7 % — ABNORMAL HIGH (ref 4.8–5.6)

## 2022-03-20 MED ORDER — LOSARTAN POTASSIUM 100 MG PO TABS: 100.0000 mg | ORAL_TABLET | Freq: Every day | ORAL | 1 refills | Status: DC

## 2022-03-20 MED ORDER — ATORVASTATIN CALCIUM 40 MG PO TABS: 40.0000 mg | ORAL_TABLET | Freq: Every day | ORAL | 1 refills | Status: DC

## 2022-03-20 MED ORDER — AMLODIPINE BESYLATE 10 MG PO TABS: 10.0000 mg | ORAL_TABLET | Freq: Every day | ORAL | 1 refills | Status: DC

## 2022-03-20 MED ORDER — PHENYTOIN SODIUM EXTENDED 100 MG PO CAPS: 300.0000 mg | ORAL_CAPSULE | Freq: Every day | ORAL | 1 refills | Status: DC

## 2022-03-20 NOTE — Progress Notes (Signed)
Subjective:    Patient ID: Melanie Maynard, female    DOB: Mar 07, 1961, 61 y.o.   MRN: [id number]   Chief Complaint: medical management of chronic issues     HPI:  Melanie Maynard is a 61 y.o. who identifies as a female who was assigned female at birth.   Social history: Lives with: her boyfriend Work history: still works at [employer] in today for follow up of the following chronic medical issues:  1. Essential hypertension No c/o chest pain, sob or headche. Does not check blood pressure at home. BP Readings from Last 3 Encounters:  09/17/21 (!) 134/92  06/17/21 140/86  05/16/21 (!) 158/82     2. Hyperlipidemia with target LDL less than 100 Doe snot really watch diet and does no dedicated exercise. Lab Results  Component Value Date   CHOL 158 09/17/2021   HDL 57 09/17/2021   LDLCALC 83 09/17/2021   TRIG 97 09/17/2021   CHOLHDL 2.8 09/17/2021     3. Type 2 diabetes mellitus treated without insulin ([place]) She does not check blood sugars at home. Says she does not watch her diet.  4. Neuralgia of right lower extremity Has numbness in bil feet. She was actually having toe pain. Went to specialist and they dx her with pinch nerve in her toe and gave her a cortisone shot. Has had no issues witnhit since.  5. Seizures ([place]) No recent seizure activity  6. Vitamin D deficiency Does not take vitamin d supplement every day  7. Morbid obesity ([place]) No recent weight changes Wt Readings from Last 3 Encounters:  03/20/22 167 lb (75.8 kg)  09/17/21 159 lb (72.1 kg)  06/17/21 160 lb (72.6 kg)   BMI Readings from Last 3 Encounters:  03/20/22 28.67 kg/m  09/17/21 27.29 kg/m  06/17/21 27.46 kg/m     New complaints: None today  Allergies  Allergen Reactions   Gabapentin Other (See Comments)    Chest pain   Outpatient Encounter Medications as of 03/20/2022  Medication Sig   amLODipine (NORVASC) 10 MG tablet Take 1 tablet (10 mg total) by mouth daily.    atorvastatin (LIPITOR) 40 MG tablet Take 1 tablet (40 mg total) by mouth daily.   losartan (COZAAR) 100 MG tablet Take 1 tablet (100 mg total) by mouth daily.   phenytoin (DILANTIN) 100 MG ER capsule TAKE 3 CAPSULES BY MOUTH AT BEDTIME   No facility-administered encounter medications on file as of 03/20/2022.    Past Surgical History:  Procedure Laterality Date   ABDOMINAL HYSTERECTOMY     APPENDECTOMY     CATARACT EXTRACTION W/PHACO Right 09/26/2016   Procedure: CATARACT EXTRACTION PHACO AND INTRAOCULAR LENS PLACEMENT (IOC);  Surgeon: Baruch Goldmann, MD;  Location: [facility];  Service: Ophthalmology;  Laterality: Right;  CDE: 3.16   CHOLECYSTECTOMY      Family History  Problem Relation Age of Onset   Diabetes Mother    Heart attack Mother 60   Healthy Father    Alcohol abuse Father    Diabetes Sister    Cancer Sister        breast   Cirrhosis Brother    Leukemia Sister    Diabetes Sister    Kidney disease Sister        on dialysis      Controlled substance contract: n/a     Review of Systems  Constitutional:  Negative for diaphoresis.  Eyes:  Negative for pain.  Respiratory:  Negative  for shortness of breath.   Cardiovascular:  Negative for chest pain, palpitations and leg swelling.  Gastrointestinal:  Negative for abdominal pain.  Endocrine: Negative for polydipsia.  Skin:  Negative for rash.  Neurological:  Negative for dizziness, weakness and headaches.  Hematological:  Does not bruise/bleed easily.  All other systems reviewed and are negative.      Objective:   Physical Exam Vitals and nursing note reviewed.  Constitutional:      General: She is not in acute distress.    Appearance: Normal appearance. She is well-developed.  HENT:     Head: Normocephalic.     Right Ear: Tympanic membrane normal.     Left Ear: Tympanic membrane normal.     Nose: Nose normal.     Mouth/Throat:     Mouth: Mucous membranes are moist.  Eyes:     Pupils: Pupils are  equal, round, and reactive to light.  Neck:     Vascular: No carotid bruit or JVD.  Cardiovascular:     Rate and Rhythm: Normal rate and regular rhythm.     Heart sounds: Normal heart sounds.  Pulmonary:     Effort: Pulmonary effort is normal. No respiratory distress.     Breath sounds: Normal breath sounds. No wheezing or rales.  Chest:     Chest wall: No tenderness.  Abdominal:     General: Bowel sounds are normal. There is no distension or abdominal bruit.     Palpations: Abdomen is soft. There is no hepatomegaly, splenomegaly, mass or pulsatile mass.     Tenderness: There is no abdominal tenderness.  Musculoskeletal:        General: Normal range of motion.     Cervical back: Normal range of motion and neck supple.  Lymphadenopathy:     Cervical: No cervical adenopathy.  Skin:    General: Skin is warm and dry.  Neurological:     Mental Status: She is alert and oriented to person, place, and time.     Deep Tendon Reflexes: Reflexes are normal and symmetric.  Psychiatric:        Behavior: Behavior normal.        Thought Content: Thought content normal.        Judgment: Judgment normal.    BP 124/81   Pulse 74   Temp 97.9 F (36.6 C) (Temporal)   Resp 20   Ht _0  (1.626 m)   Wt 167 lb (75.8 kg)   LMP 01/23/2001 (Approximate)   SpO2 98%   BMI 28.67 kg/m   Hgba1c 6.7%      Assessment & Plan:  Melanie Maynard comes in today with chief complaint of Medical Management of Chronic Issues   Diagnosis and orders addressed:  1. Essential hypertension Low sodium diet - CBC with Differential/Platelet - CMP14+EGFR - amLODipine (NORVASC) 10 MG tablet; Take 1 tablet (10 mg total) by mouth daily.  Dispense: 90 tablet; Refill: 1 - losartan (COZAAR) 100 MG tablet; Take 1 tablet (100 mg total) by mouth daily.  Dispense: 90 tablet; Refill: 1  2. Hyperlipidemia with target LDL less than 100 Low fat diet - Lipid panel - atorvastatin (LIPITOR) 40 MG tablet; Take 1 tablet (40 mg  total) by mouth daily.  Dispense: 90 tablet; Refill: 1  3. Type 2 diabetes mellitus treated without insulin ([facility]) Continue to watch carbs in diet - Bayer DCA Hb A1c Waived - Microalbumin / creatinine urine ratio  4. Neuralgia of right lower extremity No longer  an issue  5. Seizures ([place]) Report any seizure activity - phenytoin (DILANTIN) 100 MG ER capsule; Take 3 capsules (300 mg total) by mouth at bedtime.  Dispense: 270 capsule; Refill: 1  6. Vitamin D deficiency Continue vitamin d supplements - VITAMIN D 25 Hydroxy (Vit-D Deficiency, Fractures)  7. Morbid obesity ([place]) Discussed diet and exercise for person with BMI >25 Will recheck weight in 3-6 months    Labs pending Health Maintenance reviewed Diet and exercise encouraged  Follow up plan: 3 months  [name] Done, FNP

## 2022-03-20 NOTE — Patient Instructions (Signed)
Exercising to Stay Healthy To become healthy and stay healthy, it is recommended that you do moderate-intensity and vigorous-intensity exercise. You can tell that you are exercising at a moderate intensity if your heart starts beating faster and you start breathing faster but can still hold a conversation. You can tell that you are exercising at a vigorous intensity if you are breathing much harder and faster and cannot hold a conversation while exercising. How can exercise benefit me? Exercising regularly is important. It has many health benefits, such as: Improving overall fitness, flexibility, and endurance. Increasing bone density. Helping with weight control. Decreasing body fat. Increasing muscle strength and endurance. Reducing stress and tension, anxiety, depression, or anger. Improving overall health. What guidelines should I follow while exercising? Before you start a new exercise program, talk with your health care provider. Do not exercise so much that you hurt yourself, feel dizzy, or get very short of breath. Wear comfortable clothes and wear shoes with good support. Drink plenty of water while you exercise to prevent dehydration or heat stroke. Work out until your breathing and your heartbeat get faster (moderate intensity). How often should I exercise? Choose an activity that you enjoy, and set realistic goals. Your health care provider can help you make an activity plan that is individually designed and works best for you. Exercise regularly as told by your health care provider. This may include: Doing strength training two times a week, such as: Lifting weights. Using resistance bands. Push-ups. Sit-ups. Yoga. Doing a certain intensity of exercise for a given amount of time. Choose from these options: A total of 150 minutes of moderate-intensity exercise every week. A total of 75 minutes of vigorous-intensity exercise every week. A mix of moderate-intensity and  vigorous-intensity exercise every week. Children, pregnant women, people who have not exercised regularly, people who are overweight, and older adults may need to talk with a health care provider about what activities are safe to perform. If you have a medical condition, be sure to talk with your health care provider before you start a new exercise program. What are some exercise ideas? Moderate-intensity exercise ideas include: Walking 1 mile (1.6 km) in about 15 minutes. Biking. Hiking. Golfing. Dancing. Water aerobics. Vigorous-intensity exercise ideas include: Walking 4.5 miles (7.2 km) or more in about 1 hour. Jogging or running 5 miles (8 km) in about 1 hour. Biking 10 miles (16.1 km) or more in about 1 hour. Lap swimming. Roller-skating or in-line skating. Cross-country skiing. Vigorous competitive sports, such as football, basketball, and soccer. Jumping rope. Aerobic dancing. What are some everyday activities that can help me get exercise? Yard work, such as: Pushing a lawn mower. Raking and bagging leaves. Washing your car. Pushing a stroller. Shoveling snow. Gardening. Washing windows or floors. How can I be more active in my day-to-day activities? Use stairs instead of an elevator. Take a walk during your lunch break. If you drive, park your car farther away from your work or school. If you take public transportation, get off one stop early and walk the rest of the way. Stand up or walk around during all of your indoor phone calls. Get up, stretch, and walk around every 30 minutes throughout the day. Enjoy exercise with a friend. Support to continue exercising will help you keep a regular routine of activity. Where to find more information You can find more information about exercising to stay healthy from: U.S. Department of Health and Human Services: www.hhs.gov Centers for Disease Control and Prevention (  CDC): www.cdc.gov Summary Exercising regularly is  important. It will improve your overall fitness, flexibility, and endurance. Regular exercise will also improve your overall health. It can help you control your weight, reduce stress, and improve your bone density. Do not exercise so much that you hurt yourself, feel dizzy, or get very short of breath. Before you start a new exercise program, talk with your health care provider. This information is not intended to replace advice given to you by your health care provider. Make sure you discuss any questions you have with your health care provider. Document Revised: 07/06/2020 Document Reviewed: 07/06/2020 Elsevier Patient Education  2023 Elsevier Inc.  

## 2022-03-21 LAB — CMP14+EGFR
ALT: 9 IU/L (ref 0–32)
AST: 14 IU/L (ref 0–40)
Albumin/Globulin Ratio: 1.4 (ref 1.2–2.2)
Albumin: 4.2 g/dL (ref 3.9–4.9)
Alkaline Phosphatase: 231 IU/L — ABNORMAL HIGH (ref 44–121)
BUN/Creatinine Ratio: 15 (ref 12–28)
BUN: 14 mg/dL (ref 8–27)
Bilirubin Total: <0.2 mg/dL (ref 0.0–1.2)
CO2: 26 mmol/L (ref 20–29)
Calcium: 9.5 mg/dL (ref 8.7–10.3)
Chloride: 99 mmol/L (ref 96–106)
Creatinine, Ser: 0.91 mg/dL (ref 0.57–1.00)
Globulin, Total: 3.1 g/dL (ref 1.5–4.5)
Glucose: 120 mg/dL — ABNORMAL HIGH (ref 70–99)
Potassium: 4.4 mmol/L (ref 3.5–5.2)
Sodium: 136 mmol/L (ref 134–144)
Total Protein: 7.3 g/dL (ref 6.0–8.5)
eGFR: 72 mL/min/{1.73_m2} (ref >59)

## 2022-03-21 LAB — LIPID PANEL
Chol/HDL Ratio: 3 ratio (ref 0.0–4.4)
Cholesterol, Total: 187 mg/dL (ref 100–199)
HDL: 62 mg/dL (ref >39)
LDL Chol Calc (NIH): 106 mg/dL — ABNORMAL HIGH (ref 0–99)
Triglycerides: 108 mg/dL (ref 0–149)
VLDL Cholesterol Cal: 19 mg/dL (ref 5–40)

## 2022-03-21 LAB — CBC WITH DIFFERENTIAL/PLATELET
Basophils Absolute: 0.1 10*3/uL (ref 0.0–0.2)
Basos: 1 %
EOS (ABSOLUTE): 0.6 10*3/uL — ABNORMAL HIGH (ref 0.0–0.4)
Eos: 6 %
Hematocrit: 44.7 % (ref 34.0–46.6)
Hemoglobin: 14.8 g/dL (ref 11.1–15.9)
Immature Grans (Abs): 0 10*3/uL (ref 0.0–0.1)
Immature Granulocytes: 0 %
Lymphocytes Absolute: 4 10*3/uL — ABNORMAL HIGH (ref 0.7–3.1)
Lymphs: 39 %
MCH: 26.1 pg — ABNORMAL LOW (ref 26.6–33.0)
MCHC: 33.1 g/dL (ref 31.5–35.7)
MCV: 79 fL (ref 79–97)
Monocytes Absolute: 0.6 10*3/uL (ref 0.1–0.9)
Monocytes: 6 %
Neutrophils Absolute: 4.9 10*3/uL (ref 1.4–7.0)
Neutrophils: 48 %
Platelets: 290 10*3/uL (ref 150–450)
RBC: 5.66 x10E6/uL — ABNORMAL HIGH (ref 3.77–5.28)
RDW: 14.3 % (ref 11.7–15.4)
WBC: 10.2 10*3/uL (ref 3.4–10.8)

## 2022-03-21 LAB — VITAMIN D 25 HYDROXY (VIT D DEFICIENCY, FRACTURES): Vit D, 25-Hydroxy: 19 ng/mL — ABNORMAL LOW (ref 30.0–100.0)

## 2022-03-21 LAB — MICROALBUMIN / CREATININE URINE RATIO
Creatinine, Urine: 101.6 mg/dL
Microalb/Creat Ratio: 21 mg/g creat (ref 0–29)
Microalbumin, Urine: 21.8 ug/mL

## 2022-03-25 MED ORDER — VITAMIN D (ERGOCALCIFEROL) 1.25 MG (50000 UNIT) PO CAPS: 50000.0000 [IU] | ORAL_CAPSULE | ORAL | 1 refills | Status: DC

## 2022-03-25 NOTE — Addendum Note (Signed)
Addended by: Chevis Pretty on: 03/25/2022 12:54 PM   Modules accepted: Orders

## 2022-04-15 ENCOUNTER — Other Ambulatory Visit: Payer: Self-pay | Admitting: Nurse Practitioner

## 2022-04-15 DIAGNOSIS — Z1231 Encounter for screening mammogram for malignant neoplasm of breast: Secondary | ICD-10-CM

## 2022-04-21 ENCOUNTER — Inpatient Hospital Stay: Admission: RE | Admit: 2022-04-21 | Payer: BC Managed Care – PPO | Source: Ambulatory Visit

## 2022-06-16 ENCOUNTER — Encounter: Payer: Self-pay | Admitting: Nurse Practitioner

## 2022-06-23 ENCOUNTER — Encounter: Payer: BC Managed Care – PPO | Admitting: Nurse Practitioner

## 2022-06-24 ENCOUNTER — Encounter: Payer: BC Managed Care – PPO | Admitting: Nurse Practitioner

## 2022-10-16 ENCOUNTER — Other Ambulatory Visit: Payer: Self-pay | Admitting: Nurse Practitioner

## 2022-10-16 DIAGNOSIS — I10 Essential (primary) hypertension: Secondary | ICD-10-CM

## 2022-10-16 DIAGNOSIS — E785 Hyperlipidemia, unspecified: Secondary | ICD-10-CM

## 2022-11-16 ENCOUNTER — Other Ambulatory Visit: Payer: Self-pay | Admitting: Nurse Practitioner

## 2022-11-16 DIAGNOSIS — I10 Essential (primary) hypertension: Secondary | ICD-10-CM

## 2022-11-23 ENCOUNTER — Other Ambulatory Visit: Payer: Self-pay | Admitting: Nurse Practitioner

## 2022-11-23 DIAGNOSIS — I10 Essential (primary) hypertension: Secondary | ICD-10-CM

## 2022-11-23 DIAGNOSIS — E785 Hyperlipidemia, unspecified: Secondary | ICD-10-CM

## 2022-11-25 MED ORDER — ATORVASTATIN CALCIUM 40 MG PO TABS: 40.0000 mg | ORAL_TABLET | Freq: Every day | ORAL | 0 refills | Status: DC

## 2022-11-25 MED ORDER — AMLODIPINE BESYLATE 10 MG PO TABS: 10.0000 mg | ORAL_TABLET | Freq: Every day | ORAL | 0 refills | Status: DC

## 2022-11-25 NOTE — Telephone Encounter (Signed)
Pt scheduled for 12/09/2022

## 2022-11-25 NOTE — Addendum Note (Signed)
Addended by: Julious Payer D on: 11/25/2022 10:41 AM   Modules accepted: Orders

## 2022-11-25 NOTE — Telephone Encounter (Signed)
MMM pt NTBS 30-d given 10/17/22

## 2022-12-09 ENCOUNTER — Ambulatory Visit: Payer: Self-pay | Admitting: Nurse Practitioner

## 2022-12-18 ENCOUNTER — Other Ambulatory Visit: Payer: Self-pay | Admitting: Nurse Practitioner

## 2022-12-18 DIAGNOSIS — I10 Essential (primary) hypertension: Secondary | ICD-10-CM

## 2022-12-18 DIAGNOSIS — R569 Unspecified convulsions: Secondary | ICD-10-CM

## 2022-12-21 ENCOUNTER — Other Ambulatory Visit: Payer: Self-pay | Admitting: Nurse Practitioner

## 2022-12-21 DIAGNOSIS — E785 Hyperlipidemia, unspecified: Secondary | ICD-10-CM

## 2022-12-21 DIAGNOSIS — I10 Essential (primary) hypertension: Secondary | ICD-10-CM

## 2022-12-23 ENCOUNTER — Encounter: Payer: Self-pay | Admitting: Nurse Practitioner

## 2022-12-23 ENCOUNTER — Ambulatory Visit (INDEPENDENT_AMBULATORY_CARE_PROVIDER_SITE_OTHER): Payer: Self-pay | Admitting: Nurse Practitioner

## 2022-12-23 VITALS — BP 136/78 | HR 78 | Temp 96.9°F | Resp 20 | Ht 64.0 in | Wt 175.0 lb

## 2022-12-23 DIAGNOSIS — E559 Vitamin D deficiency, unspecified: Secondary | ICD-10-CM

## 2022-12-23 DIAGNOSIS — E119 Type 2 diabetes mellitus without complications: Secondary | ICD-10-CM

## 2022-12-23 DIAGNOSIS — I10 Essential (primary) hypertension: Secondary | ICD-10-CM

## 2022-12-23 DIAGNOSIS — E785 Hyperlipidemia, unspecified: Secondary | ICD-10-CM

## 2022-12-23 DIAGNOSIS — R569 Unspecified convulsions: Secondary | ICD-10-CM

## 2022-12-23 LAB — BAYER DCA HB A1C WAIVED: HB A1C (BAYER DCA - WAIVED): 5.9 % — ABNORMAL HIGH (ref 4.8–5.6)

## 2022-12-23 MED ORDER — PHENYTOIN SODIUM EXTENDED 100 MG PO CAPS
300.0000 mg | ORAL_CAPSULE | Freq: Every day | ORAL | 1 refills | Status: DC
Start: 2022-12-23 — End: 2023-06-16

## 2022-12-23 MED ORDER — VITAMIN D (ERGOCALCIFEROL) 1.25 MG (50000 UNIT) PO CAPS
50000.0000 [IU] | ORAL_CAPSULE | ORAL | 1 refills | Status: DC
Start: 2022-12-23 — End: 2023-06-11

## 2022-12-23 MED ORDER — AMLODIPINE BESYLATE 10 MG PO TABS
10.0000 mg | ORAL_TABLET | Freq: Every day | ORAL | 1 refills | Status: DC
Start: 2022-12-23 — End: 2023-01-26

## 2022-12-23 MED ORDER — LOSARTAN POTASSIUM 100 MG PO TABS: 100.0000 mg | ORAL_TABLET | Freq: Every day | ORAL | 1 refills | Status: DC

## 2022-12-23 MED ORDER — ATORVASTATIN CALCIUM 40 MG PO TABS
40.0000 mg | ORAL_TABLET | Freq: Every day | ORAL | 1 refills | Status: DC
Start: 2022-12-23 — End: 2023-06-16

## 2022-12-23 MED ORDER — BENZONATATE 100 MG PO CAPS
100.0000 mg | ORAL_CAPSULE | Freq: Three times a day (TID) | ORAL | 0 refills | Status: DC | PRN
Start: 2022-12-23 — End: 2023-01-28

## 2022-12-23 NOTE — Patient Instructions (Signed)

## 2022-12-23 NOTE — Progress Notes (Signed)
Subjective:    Patient ID: Melanie Maynard, female    DOB: 02/24/1961, 61 y.o.   MRN: [id number]   Chief Complaint: Medical Management of Chronic Issues    HPI:  Melanie Maynard is a 62 y.o. who identifies as a female who was assigned female at birth.   Social history: Lives with: her boyfriend Work history: retired from [employer] in today for follow up of the following chronic medical issues:  1. Essential hypertension No c/o chest pain, sob or headache. Does not check blood pressure at home. BP Readings from Last 3 Encounters:  12/23/22 (!) 144/97  03/20/22 124/81  09/17/21 (!) 134/92     2. Hyperlipidemia with target LDL less than 100 Does not watch diet and does no dedicated exercise. Lab Results  Component Value Date   CHOL 187 03/20/2022   HDL 62 03/20/2022   LDLCALC 106 (H) 03/20/2022   TRIG 108 03/20/2022   CHOLHDL 3.0 03/20/2022   The 10-year ASCVD risk score (Arnett DK, et al., 2019) is: 33.1%   3. Type 2 diabetes mellitus treated without insulin (HCC) She doe snot check her blood sugars at home. Lab Results  Component Value Date   HGBA1C 6.7 (H) 03/20/2022     4. Seizures (HCC) No recurrent seizure activity  5. Vitamin D deficiency Is suppose to be on vitamin d supplement  6. Morbid obesity (HCC) Weight is up 8lbs Wt Readings from Last 3 Encounters:  12/23/22 175 lb (79.4 kg)  03/20/22 167 lb (75.8 kg)  09/17/21 159 lb (72.1 kg)   BMI Readings from Last 3 Encounters:  12/23/22 30.04 kg/m  03/20/22 28.67 kg/m  09/17/21 27.29 kg/m      New complaints: None today  Allergies  Allergen Reactions   Gabapentin Other (See Comments)    Chest pain   Outpatient Encounter Medications as of 12/23/2022  Medication Sig   amLODipine (NORVASC) 10 MG tablet Take 1 tablet by mouth once daily   atorvastatin (LIPITOR) 40 MG tablet Take 1 tablet by mouth once daily   losartan (COZAAR) 100 MG tablet Take 1 tablet by mouth once daily    phenytoin (DILANTIN) 100 MG ER capsule Take 3 capsules (300 mg total) by mouth at bedtime.   Vitamin D, Ergocalciferol, (DRISDOL) 1.25 MG (50000 UNIT) CAPS capsule Take 1 capsule (50,000 Units total) by mouth every 7 (seven) days. (Patient not taking: Reported on 12/23/2022)   No facility-administered encounter medications on file as of 12/23/2022.    Past Surgical History:  Procedure Laterality Date   ABDOMINAL HYSTERECTOMY     APPENDECTOMY     CATARACT EXTRACTION W/PHACO Right 09/26/2016   Procedure: CATARACT EXTRACTION PHACO AND INTRAOCULAR LENS PLACEMENT (IOC);  Surgeon: Fabio Pierce, MD;  Location: [facility];  Service: Ophthalmology;  Laterality: Right;  CDE: 3.16   CHOLECYSTECTOMY      Family History  Problem Relation Age of Onset   Diabetes Mother    Heart attack Mother 53   Healthy Father    Alcohol abuse Father    Diabetes Sister    Cancer Sister        breast   Cirrhosis Brother    Leukemia Sister    Diabetes Sister    Kidney disease Sister        on dialysis      Controlled substance contract: n/a     Review of Systems  Constitutional:  Negative for diaphoresis.  Eyes:  Negative for pain.  Respiratory:  Negative for shortness of breath.   Cardiovascular:  Negative for chest pain, palpitations and leg swelling.  Gastrointestinal:  Negative for abdominal pain.  Endocrine: Negative for polydipsia.  Skin:  Negative for rash.  Neurological:  Negative for dizziness, weakness and headaches.  Hematological:  Does not bruise/bleed easily.  All other systems reviewed and are negative.      Objective:   Physical Exam Vitals and nursing note reviewed.  Constitutional:      General: She is not in acute distress.    Appearance: Normal appearance. She is well-developed.  HENT:     Head: Normocephalic.     Right Ear: Tympanic membrane normal.     Left Ear: Tympanic membrane normal.     Nose: Nose normal.     Mouth/Throat:     Mouth: Mucous membranes are moist.   Eyes:     Pupils: Pupils are equal, round, and reactive to light.  Neck:     Vascular: No carotid bruit or JVD.  Cardiovascular:     Rate and Rhythm: Normal rate and regular rhythm.     Heart sounds: Normal heart sounds.  Pulmonary:     Effort: Pulmonary effort is normal. No respiratory distress.     Breath sounds: Normal breath sounds. No wheezing or rales.  Chest:     Chest wall: No tenderness.  Abdominal:     General: Bowel sounds are normal. There is no distension or abdominal bruit.     Palpations: Abdomen is soft. There is no hepatomegaly, splenomegaly, mass or pulsatile mass.     Tenderness: There is no abdominal tenderness.  Musculoskeletal:        General: Normal range of motion.     Cervical back: Normal range of motion and neck supple.  Lymphadenopathy:     Cervical: No cervical adenopathy.  Skin:    General: Skin is warm and dry.  Neurological:     Mental Status: She is alert and oriented to person, place, and time.     Deep Tendon Reflexes: Reflexes are normal and symmetric.  Psychiatric:        Behavior: Behavior normal.        Thought Content: Thought content normal.        Judgment: Judgment normal.     BP 136/78 (Cuff Size: Normal)   Pulse 78   Temp (!) 96.9 F (36.1 C) (Temporal)   Resp 20   Ht 5\' 4"  (1.626 m)   Wt 175 lb (79.4 kg)   LMP 01/23/2001 (Approximate)   SpO2 91%   BMI 30.04 kg/m    Hgab1c 5.9%     Assessment & Plan:  Melanie Maynard comes in today with chief complaint of Medical Management of Chronic Issues   Diagnosis and orders addressed:  1. Essential hypertension Low sodium diet - CBC with Differential/Platelet - CMP14+EGFR - amLODipine (NORVASC) 10 MG tablet; Take 1 tablet (10 mg total) by mouth daily.  Dispense: 30 tablet; Refill: 1 - losartan (COZAAR) 100 MG tablet; Take 1 tablet (100 mg total) by mouth daily.  Dispense: 90 tablet; Refill: 1  2. Hyperlipidemia with target LDL less than 100 Low fat diet - Lipid panel -  atorvastatin (LIPITOR) 40 MG tablet; Take 1 tablet (40 mg total) by mouth daily.  Dispense: 90 tablet; Refill: 1  3. Type 2 diabetes mellitus treated without insulin (HCC) Continue to watch carbs in diet - Bayer DCA Hb A1c Waived  4. Seizures (HCC)  - phenytoin (  DILANTIN) 100 MG ER capsule; Take 3 capsules (300 mg total) by mouth at bedtime.  Dispense: 270 capsule; Refill: 1  5. Vitamin D deficiency Continue vitamin d supplement - Vitamin D, Ergocalciferol, (DRISDOL) 1.25 MG (50000 UNIT) CAPS capsule; Take 1 capsule (50,000 Units total) by mouth every 7 (seven) days.  Dispense: 12 capsule; Refill: 1  6. Morbid obesity (HCC) Discussed diet and exercise for person with BMI >25 Will recheck weight in 3-6 months    Labs pending Health Maintenance reviewed Diet and exercise encouraged  Follow up plan: 6 months   Mary-Margaret Daphine Deutscher, FNP

## 2022-12-24 LAB — CBC WITH DIFFERENTIAL/PLATELET
Basophils Absolute: 0.1 10*3/uL (ref 0.0–0.2)
Basos: 1 %
EOS (ABSOLUTE): 0.3 10*3/uL (ref 0.0–0.4)
Eos: 3 %
Hematocrit: 41 % (ref 34.0–46.6)
Hemoglobin: 13.3 g/dL (ref 11.1–15.9)
Immature Grans (Abs): 0 10*3/uL (ref 0.0–0.1)
Immature Granulocytes: 0 %
Lymphocytes Absolute: 3.3 10*3/uL — ABNORMAL HIGH (ref 0.7–3.1)
Lymphs: 33 %
MCH: 26.4 pg — ABNORMAL LOW (ref 26.6–33.0)
MCHC: 32.4 g/dL (ref 31.5–35.7)
MCV: 81 fL (ref 79–97)
Monocytes Absolute: 0.6 10*3/uL (ref 0.1–0.9)
Monocytes: 6 %
Neutrophils Absolute: 5.8 10*3/uL (ref 1.4–7.0)
Neutrophils: 57 %
Platelets: 333 10*3/uL (ref 150–450)
RBC: 5.04 x10E6/uL (ref 3.77–5.28)
RDW: 13.8 % (ref 11.7–15.4)
WBC: 10.1 10*3/uL (ref 3.4–10.8)

## 2022-12-24 LAB — CMP14+EGFR
ALT: 7 [IU]/L (ref 0–32)
AST: 10 [IU]/L (ref 0–40)
Albumin: 3.7 g/dL — ABNORMAL LOW (ref 3.9–4.9)
Alkaline Phosphatase: 205 [IU]/L — ABNORMAL HIGH (ref 44–121)
BUN/Creatinine Ratio: 12 (ref 12–28)
BUN: 10 mg/dL (ref 8–27)
Bilirubin Total: 0.2 mg/dL (ref 0.0–1.2)
CO2: 22 mmol/L (ref 20–29)
Calcium: 8.8 mg/dL (ref 8.7–10.3)
Chloride: 102 mmol/L (ref 96–106)
Creatinine, Ser: 0.82 mg/dL (ref 0.57–1.00)
Globulin, Total: 3 g/dL (ref 1.5–4.5)
Glucose: 131 mg/dL — ABNORMAL HIGH (ref 70–99)
Potassium: 4 mmol/L (ref 3.5–5.2)
Sodium: 141 mmol/L (ref 134–144)
Total Protein: 6.7 g/dL (ref 6.0–8.5)
eGFR: 81 mL/min/{1.73_m2} (ref >59)

## 2022-12-24 LAB — LIPID PANEL
Cholesterol, Total: 152 mg/dL (ref 100–199)
HDL: 51 mg/dL (ref >39)
LDL CALC COMMENT:: 3 ratio (ref 0.0–4.4)
LDL Chol Calc (NIH): 82 mg/dL (ref 0–99)
Triglycerides: 106 mg/dL (ref 0–149)
VLDL Cholesterol Cal: 19 mg/dL (ref 5–40)

## 2023-01-07 ENCOUNTER — Telehealth: Payer: Self-pay | Admitting: Nurse Practitioner

## 2023-01-07 NOTE — Telephone Encounter (Signed)
If she is no better, she likely needs to be seen again.  Please make her an appointment

## 2023-01-08 NOTE — Telephone Encounter (Signed)
Called patient unable to leave message  please make appt with anyone when patient calls back

## 2023-01-08 NOTE — Telephone Encounter (Signed)
Pt aware.

## 2023-01-24 ENCOUNTER — Other Ambulatory Visit: Payer: Self-pay | Admitting: Nurse Practitioner

## 2023-01-24 DIAGNOSIS — I10 Essential (primary) hypertension: Secondary | ICD-10-CM

## 2023-01-28 ENCOUNTER — Encounter: Payer: Self-pay | Admitting: Family Medicine

## 2023-01-28 ENCOUNTER — Ambulatory Visit (INDEPENDENT_AMBULATORY_CARE_PROVIDER_SITE_OTHER): Payer: Self-pay | Admitting: Family Medicine

## 2023-01-28 VITALS — BP 131/72 | HR 75 | Ht 64.0 in | Wt 178.0 lb

## 2023-01-28 DIAGNOSIS — R053 Chronic cough: Secondary | ICD-10-CM

## 2023-01-28 MED ORDER — PROMETHAZINE-DM 6.25-15 MG/5ML PO SYRP
5.0000 mL | ORAL_SOLUTION | Freq: Four times a day (QID) | ORAL | 0 refills | Status: DC | PRN
Start: 2023-01-28 — End: 2023-09-24

## 2023-01-28 MED ORDER — AIRSUPRA 90-80 MCG/ACT IN AERO: 1.0000 | INHALATION_SPRAY | Freq: Every evening | RESPIRATORY_TRACT | Status: DC | PRN

## 2023-01-28 NOTE — Progress Notes (Signed)
BP 131/72   Pulse 75   Ht 5\' 4"  (1.626 m)   Wt 178 lb (80.7 kg)   LMP 01/23/2001 (Approximate)   SpO2 96%   BMI 30.55 kg/m    Subjective:   Patient ID: Melanie Maynard, female    DOB: 08-17-1960, 62 y.o.   MRN: [id number]  HPI: Melanie Maynard is a 62 y.o. female presenting on 01/28/2023 for Cough   HPI Persistent cough Patient is coming in today with issues of a persistent cough that is been going on over a month.  She started with the sickness and then she just has not fully been able to clear it.  She does admit that she still smokes greater than a pack per day and has no desire to quit.  She did try some Tessalon Perles did not seem to help.  She is also tried before that NyQuil DayQuil Alka-Seltzer and other over-the-counter medicines without much success.  She says she is producing some phlegm and the cough is mainly worse at night.  She is not having as much cough during the daytime.  She denies any shortness of breath or wheezing.  Relevant past medical, surgical, family and social history reviewed and updated as indicated. Interim medical history since our last visit reviewed. Allergies and medications reviewed and updated.  Review of Systems  Constitutional:  Negative for chills and fever.  HENT:  Negative for congestion, ear discharge, ear pain, sinus pressure, sinus pain, sneezing and sore throat.   Eyes:  Negative for redness and visual disturbance.  Respiratory:  Positive for cough. Negative for chest tightness, shortness of breath and wheezing.   Cardiovascular:  Negative for chest pain and leg swelling.  Genitourinary:  Negative for difficulty urinating and dysuria.  Musculoskeletal:  Negative for back pain and gait problem.  Skin:  Negative for rash.  Neurological:  Negative for light-headedness and headaches.  Psychiatric/Behavioral:  Negative for agitation and behavioral problems.   All other systems reviewed and are negative.   Per HPI unless specifically  indicated above   Allergies as of 01/28/2023       Reactions   Gabapentin Other (See Comments)   Chest pain        Medication List        Accurate as of January 28, 2023  8:29 AM. If you have any questions, ask your nurse or doctor.          STOP taking these medications    benzonatate 100 MG capsule Commonly known as: Lawyer Stopped by: Elige Radon Chauncy Mangiaracina       TAKE these medications    Airsupra 90-80 MCG/ACT Aero Generic drug: Albuterol-Budesonide Inhale 1 Inhaler into the lungs at bedtime as needed. Started by: [name]   amLODipine 10 MG tablet Commonly known as: NORVASC Take 1 tablet by mouth once daily   atorvastatin 40 MG tablet Commonly known as: LIPITOR Take 1 tablet (40 mg total) by mouth daily.   losartan 100 MG tablet Commonly known as: COZAAR Take 1 tablet (100 mg total) by mouth daily.   phenytoin 100 MG ER capsule Commonly known as: DILANTIN Take 3 capsules (300 mg total) by mouth at bedtime.   promethazine-dextromethorphan 6.25-15 MG/5ML syrup Commonly known as: PROMETHAZINE-DM Take 5 mLs by mouth 4 (four) times daily as needed for cough. Started by: Elige Radon [name]   Vitamin D (Ergocalciferol) 1.25 MG (50000 UNIT) Caps capsule Commonly known as: DRISDOL Take 1 capsule (50,000 Units  total) by mouth every 7 (seven) days.         Objective:   BP 131/72   Pulse 75   Ht 5\' 4"  (1.626 m)   Wt 178 lb (80.7 kg)   LMP 01/23/2001 (Approximate)   SpO2 96%   BMI 30.55 kg/m   Wt Readings from Last 3 Encounters:  01/28/23 178 lb (80.7 kg)  12/23/22 175 lb (79.4 kg)  03/20/22 167 lb (75.8 kg)    Physical Exam Vitals and nursing note reviewed.  Constitutional:      General: She is not in acute distress.    Appearance: She is well-developed. She is not diaphoretic.  Eyes:     Conjunctiva/sclera: Conjunctivae normal.  Cardiovascular:     Rate and Rhythm: Normal rate and regular rhythm.     Heart sounds:  Normal heart sounds. No murmur heard. Pulmonary:     Effort: Pulmonary effort is normal. No respiratory distress.     Breath sounds: Wheezing (Small amount of end expiratory wheeze) present.  Musculoskeletal:        General: No tenderness. Normal range of motion.  Skin:    General: Skin is warm and dry.     Findings: No rash.  Neurological:     Mental Status: She is alert and oriented to person, place, and time.     Coordination: Coordination normal.  Psychiatric:        Behavior: Behavior normal.       Assessment & Plan:   Problem List Items Addressed This Visit   None Visit Diagnoses     Persistent cough    -  Primary   Relevant Medications   promethazine-dextromethorphan (PROMETHAZINE-DM) 6.25-15 MG/5ML syrup   Albuterol-Budesonide (AIRSUPRA) 90-80 MCG/ACT AERO       Likely in part reactive airways due to chronic smoking secondary to recent viral illness.  Gave sample for airsupra and gave promethazine dextromethorphan cough syrup. Follow up plan: Return if symptoms worsen or fail to improve.  Counseling provided for all of the vaccine components No orders of the defined types were placed in this encounter.   Arville Care, MD [facility] 01/28/2023, 8:29 AM

## 2023-02-05 ENCOUNTER — Ambulatory Visit (INDEPENDENT_AMBULATORY_CARE_PROVIDER_SITE_OTHER): Payer: Self-pay | Admitting: *Deleted

## 2023-02-05 DIAGNOSIS — E1141 Type 2 diabetes mellitus with diabetic mononeuropathy: Secondary | ICD-10-CM

## 2023-02-05 LAB — HM DIABETES EYE EXAM

## 2023-02-05 NOTE — Progress Notes (Signed)
Melanie Maynard arrived 02/05/2023 and has given verbal consent to obtain images and complete their overdue diabetic retinal screening.  The images have been sent to an ophthalmologist or optometrist for review and interpretation.  Results will be sent back to [name], FNP for review.  Patient has been informed they will be contacted when we receive the results via telephone or MyChart

## 2023-03-25 ENCOUNTER — Other Ambulatory Visit: Payer: Self-pay | Admitting: Nurse Practitioner

## 2023-03-25 DIAGNOSIS — R569 Unspecified convulsions: Secondary | ICD-10-CM

## 2023-03-30 ENCOUNTER — Inpatient Hospital Stay: Admission: RE | Admit: 2023-03-30 | Payer: BC Managed Care – PPO | Source: Ambulatory Visit

## 2023-04-14 ENCOUNTER — Encounter: Payer: Self-pay | Admitting: *Deleted

## 2023-06-10 ENCOUNTER — Other Ambulatory Visit: Payer: Self-pay | Admitting: Nurse Practitioner

## 2023-06-10 DIAGNOSIS — E559 Vitamin D deficiency, unspecified: Secondary | ICD-10-CM

## 2023-06-16 ENCOUNTER — Other Ambulatory Visit: Payer: Self-pay | Admitting: Nurse Practitioner

## 2023-06-16 DIAGNOSIS — E785 Hyperlipidemia, unspecified: Secondary | ICD-10-CM

## 2023-06-16 DIAGNOSIS — R569 Unspecified convulsions: Secondary | ICD-10-CM

## 2023-06-16 DIAGNOSIS — I10 Essential (primary) hypertension: Secondary | ICD-10-CM

## 2023-06-17 ENCOUNTER — Ambulatory Visit (INDEPENDENT_AMBULATORY_CARE_PROVIDER_SITE_OTHER): Payer: Self-pay | Admitting: Nurse Practitioner

## 2023-06-17 ENCOUNTER — Encounter: Payer: Self-pay | Admitting: Nurse Practitioner

## 2023-06-17 VITALS — BP 127/81 | HR 85 | Temp 97.8°F | Ht 64.0 in | Wt 186.0 lb

## 2023-06-17 DIAGNOSIS — E559 Vitamin D deficiency, unspecified: Secondary | ICD-10-CM

## 2023-06-17 DIAGNOSIS — E785 Hyperlipidemia, unspecified: Secondary | ICD-10-CM

## 2023-06-17 DIAGNOSIS — Z6831 Body mass index (BMI) 31.0-31.9, adult: Secondary | ICD-10-CM

## 2023-06-17 DIAGNOSIS — E119 Type 2 diabetes mellitus without complications: Secondary | ICD-10-CM

## 2023-06-17 DIAGNOSIS — I10 Essential (primary) hypertension: Secondary | ICD-10-CM

## 2023-06-17 DIAGNOSIS — R053 Chronic cough: Secondary | ICD-10-CM

## 2023-06-17 DIAGNOSIS — R569 Unspecified convulsions: Secondary | ICD-10-CM

## 2023-06-17 LAB — BAYER DCA HB A1C WAIVED: HB A1C (BAYER DCA - WAIVED): 7.1 % — ABNORMAL HIGH (ref 4.8–5.6)

## 2023-06-17 LAB — LIPID PANEL

## 2023-06-17 MED ORDER — AMLODIPINE BESYLATE 10 MG PO TABS: 10.0000 mg | ORAL_TABLET | Freq: Every day | ORAL | 1 refills | Status: DC

## 2023-06-17 MED ORDER — LOSARTAN POTASSIUM 100 MG PO TABS
100.0000 mg | ORAL_TABLET | Freq: Every day | ORAL | 1 refills | Status: DC
Start: 2023-06-17 — End: 2023-09-24

## 2023-06-17 NOTE — Progress Notes (Signed)
 Subjective:    Patient ID: Melanie Maynard, female    DOB: 08/27/1960, 63 y.o.   MRN: [id number]   Chief Complaint: medical management of chronic issues     HPI:  Melanie Maynard is a 63 y.o. who identifies as a female who was assigned female at birth.   Social history: Lives with: her boyfriend Work history: retired from [employer] in today for follow up of the following chronic medical issues:  1. Essential hypertension No c/o chest pain, sob or headache. Does not check blood pressure at home. BP Readings from Last 3 Encounters:  01/28/23 131/72  12/23/22 136/78  03/20/22 124/81     2. Hyperlipidemia with target LDL less than 100 Does not watch diet and does no dedicated exercise. Lab Results  Component Value Date   CHOL 152 12/23/2022   HDL 51 12/23/2022   LDLCALC 82 12/23/2022   TRIG 106 12/23/2022   CHOLHDL 3.0 12/23/2022   The 10-year ASCVD risk score (Arnett DK, et al., 2019) is: 27.8%   3. Type 2 diabetes mellitus treated without insulin (HCC) She does not check her blood sugars at home. Lab Results  Component Value Date   HGBA1C 5.9 (H) 12/23/2022     4. Seizures (HCC) No recurrent seizure activity  5. Vitamin D deficiency Is suppose to be on vitamin d supplement  6. Morbid obesity (HCC) Weight is up 8lbs  Wt Readings from Last 3 Encounters:  06/17/23 186 lb (84.4 kg)  01/28/23 178 lb (80.7 kg)  12/23/22 175 lb (79.4 kg)   BMI Readings from Last 3 Encounters:  06/17/23 31.93 kg/m  01/28/23 30.55 kg/m  12/23/22 30.04 kg/m       New complaints: None today  Allergies  Allergen Reactions   Gabapentin Other (See Comments)    Chest pain   Outpatient Encounter Medications as of 06/17/2023  Medication Sig   Albuterol-Budesonide (AIRSUPRA) 90-80 MCG/ACT AERO Inhale 1 Inhaler into the lungs at bedtime as needed.   amLODipine (NORVASC) 10 MG tablet Take 1 tablet by mouth once daily   atorvastatin (LIPITOR) 40 MG tablet Take  1 tablet by mouth once daily   losartan (COZAAR) 100 MG tablet Take 1 tablet by mouth once daily   phenytoin (DILANTIN) 100 MG ER capsule TAKE 3 CAPSULES BY MOUTH AT BEDTIME   promethazine-dextromethorphan (PROMETHAZINE-DM) 6.25-15 MG/5ML syrup Take 5 mLs by mouth 4 (four) times daily as needed for cough.   Vitamin D, Ergocalciferol, (DRISDOL) 1.25 MG (50000 UNIT) CAPS capsule Take 1 capsule by mouth once a week   No facility-administered encounter medications on file as of 06/17/2023.    Past Surgical History:  Procedure Laterality Date   ABDOMINAL HYSTERECTOMY     APPENDECTOMY     CATARACT EXTRACTION W/PHACO Right 09/26/2016   Procedure: CATARACT EXTRACTION PHACO AND INTRAOCULAR LENS PLACEMENT (IOC);  Surgeon: Fabio Pierce, MD;  Location: [facility];  Service: Ophthalmology;  Laterality: Right;  CDE: 3.16   CHOLECYSTECTOMY      Family History  Problem Relation Age of Onset   Diabetes Mother    Heart attack Mother 76   Healthy Father    Alcohol abuse Father    Diabetes Sister    Cancer Sister        breast   Cirrhosis Brother    Leukemia Sister    Diabetes Sister    Kidney disease Sister        on dialysis  Controlled substance contract: n/a     Review of Systems  Constitutional:  Negative for diaphoresis.  Eyes:  Negative for pain.  Respiratory:  Negative for shortness of breath.   Cardiovascular:  Negative for chest pain, palpitations and leg swelling.  Gastrointestinal:  Negative for abdominal pain.  Endocrine: Negative for polydipsia.  Skin:  Negative for rash.  Neurological:  Negative for dizziness, weakness and headaches.  Hematological:  Does not bruise/bleed easily.  All other systems reviewed and are negative.      Objective:   Physical Exam Vitals and nursing note reviewed.  Constitutional:      General: She is not in acute distress.    Appearance: Normal appearance. She is well-developed.  HENT:     Head: Normocephalic.     Right Ear: Tympanic  membrane normal.     Left Ear: Tympanic membrane normal.     Nose: Nose normal.     Mouth/Throat:     Mouth: Mucous membranes are moist.  Eyes:     Pupils: Pupils are equal, round, and reactive to light.  Neck:     Vascular: No carotid bruit or JVD.  Cardiovascular:     Rate and Rhythm: Normal rate and regular rhythm.     Heart sounds: Normal heart sounds.  Pulmonary:     Effort: Pulmonary effort is normal. No respiratory distress.     Breath sounds: Normal breath sounds. No wheezing or rales.  Chest:     Chest wall: No tenderness.  Abdominal:     General: Bowel sounds are normal. There is no distension or abdominal bruit.     Palpations: Abdomen is soft. There is no hepatomegaly, splenomegaly, mass or pulsatile mass.     Tenderness: There is no abdominal tenderness.  Musculoskeletal:        General: Normal range of motion.     Cervical back: Normal range of motion and neck supple.  Lymphadenopathy:     Cervical: No cervical adenopathy.  Skin:    General: Skin is warm and dry.  Neurological:     Mental Status: She is alert and oriented to person, place, and time.     Deep Tendon Reflexes: Reflexes are normal and symmetric.  Psychiatric:        Behavior: Behavior normal.        Thought Content: Thought content normal.        Judgment: Judgment normal.   BP 127/81   Pulse 85   Temp 97.8 F (36.6 C) (Temporal)   Ht 5\' 4"  (1.626 m)   Wt 186 lb (84.4 kg)   LMP 01/23/2001 (Approximate)   SpO2 94%   BMI 31.93 kg/m    LMP 01/23/2001 (Approximate)    Hgab1c 7.1%     Assessment & Plan:  Melanie Maynard comes in today with chief complaint of medical management of chronic issues    Diagnosis and orders addressed:  1. Essential hypertension Low sodium diet - CBC with Differential/Platelet - CMP14+EGFR - amLODipine (NORVASC) 10 MG tablet; Take 1 tablet (10 mg total) by mouth daily.  Dispense: 30 tablet; Refill: 1 - losartan (COZAAR) 100 MG tablet; Take 1 tablet (100 mg  total) by mouth daily.  Dispense: 90 tablet; Refill: 1  2. Hyperlipidemia with target LDL less than 100 Low fat diet - Lipid panel - atorvastatin (LIPITOR) 40 MG tablet; Take 1 tablet (40 mg total) by mouth daily.  Dispense: 90 tablet; Refill: 1  3. Type 2 diabetes mellitus treated without  insulin (HCC) Continue to watch carbs in diet- little stricter on diet Will schedule appt with DR. Drake for foot care and nail care - Bayer DCA Hb A1c Waived  4. Seizures (HCC)  - phenytoin (DILANTIN) 100 MG ER capsule; Take 3 capsules (300 mg total) by mouth at bedtime.  Dispense: 270 capsule; Refill: 1  5. Vitamin D deficiency Continue vitamin d supplement - Vitamin D, Ergocalciferol, (DRISDOL) 1.25 MG (50000 UNIT) CAPS capsule; Take 1 capsule (50,000 Units total) by mouth every 7 (seven) days.  Dispense: 12 capsule; Refill: 1  6. Morbid obesity (HCC) Discussed diet and exercise for person with BMI >25 Will recheck weight in 3-6 months    Labs pending Health Maintenance reviewed Diet and exercise encouraged  Follow up plan: 3 months   Mary-Margaret Daphine Deutscher, FNP

## 2023-06-17 NOTE — Patient Instructions (Signed)

## 2023-06-18 LAB — MICROALBUMIN / CREATININE URINE RATIO
Creatinine, Urine: 260.3 mg/dL
Microalb/Creat Ratio: 20 mg/g{creat} (ref 0–29)
Microalbumin, Urine: 52.5 ug/mL

## 2023-06-18 LAB — CMP14+EGFR
ALT: 7 IU/L (ref 0–32)
AST: 11 IU/L (ref 0–40)
Albumin: 3.9 g/dL (ref 3.9–4.9)
Alkaline Phosphatase: 212 IU/L — ABNORMAL HIGH (ref 44–121)
BUN/Creatinine Ratio: 17 (ref 12–28)
BUN: 14 mg/dL (ref 8–27)
Bilirubin Total: 0.3 mg/dL (ref 0.0–1.2)
CO2: 23 mmol/L (ref 20–29)
Calcium: 8.8 mg/dL (ref 8.7–10.3)
Chloride: 102 mmol/L (ref 96–106)
Creatinine, Ser: 0.84 mg/dL (ref 0.57–1.00)
Globulin, Total: 2.7 g/dL (ref 1.5–4.5)
Glucose: 166 mg/dL — ABNORMAL HIGH (ref 70–99)
Potassium: 3.9 mmol/L (ref 3.5–5.2)
Sodium: 142 mmol/L (ref 134–144)
Total Protein: 6.6 g/dL (ref 6.0–8.5)
eGFR: 79 mL/min/{1.73_m2} (ref >59)

## 2023-06-18 LAB — CBC WITH DIFFERENTIAL/PLATELET
Basophils Absolute: 0 10*3/uL (ref 0.0–0.2)
Basos: 0 %
EOS (ABSOLUTE): 0.4 10*3/uL (ref 0.0–0.4)
Eos: 4 %
Hematocrit: 40.7 % (ref 34.0–46.6)
Hemoglobin: 13.5 g/dL (ref 11.1–15.9)
Immature Grans (Abs): 0 10*3/uL (ref 0.0–0.1)
Immature Granulocytes: 0 %
Lymphocytes Absolute: 2.9 10*3/uL (ref 0.7–3.1)
Lymphs: 29 %
MCH: 26.5 pg — ABNORMAL LOW (ref 26.6–33.0)
MCHC: 33.2 g/dL (ref 31.5–35.7)
MCV: 80 fL (ref 79–97)
Monocytes Absolute: 0.5 10*3/uL (ref 0.1–0.9)
Monocytes: 5 %
Neutrophils Absolute: 6 10*3/uL (ref 1.4–7.0)
Neutrophils: 62 %
Platelets: 283 10*3/uL (ref 150–450)
RBC: 5.1 x10E6/uL (ref 3.77–5.28)
RDW: 14.2 % (ref 11.7–15.4)
WBC: 9.9 10*3/uL (ref 3.4–10.8)

## 2023-06-18 LAB — LIPID PANEL
Cholesterol, Total: 147 mg/dL (ref 100–199)
HDL: 51 mg/dL (ref >39)
LDL CALC COMMENT:: 2.9 ratio (ref 0.0–4.4)
LDL Chol Calc (NIH): 77 mg/dL (ref 0–99)
Triglycerides: 103 mg/dL (ref 0–149)
VLDL Cholesterol Cal: 19 mg/dL (ref 5–40)

## 2023-06-19 ENCOUNTER — Ambulatory Visit: Payer: Self-pay | Admitting: Nurse Practitioner

## 2023-07-07 ENCOUNTER — Other Ambulatory Visit: Payer: Self-pay | Admitting: Nurse Practitioner

## 2023-07-07 DIAGNOSIS — Z1231 Encounter for screening mammogram for malignant neoplasm of breast: Secondary | ICD-10-CM

## 2023-07-22 ENCOUNTER — Inpatient Hospital Stay: Admission: RE | Admit: 2023-07-22 | Payer: Self-pay | Source: Ambulatory Visit

## 2023-09-17 ENCOUNTER — Ambulatory Visit: Payer: Self-pay | Admitting: Nurse Practitioner

## 2023-09-24 ENCOUNTER — Ambulatory Visit: Payer: Self-pay | Admitting: Nurse Practitioner

## 2023-09-24 ENCOUNTER — Encounter: Payer: Self-pay | Admitting: Nurse Practitioner

## 2023-09-24 ENCOUNTER — Ambulatory Visit (INDEPENDENT_AMBULATORY_CARE_PROVIDER_SITE_OTHER): Payer: Self-pay | Admitting: Nurse Practitioner

## 2023-09-24 VITALS — BP 143/94 | HR 83 | Temp 97.5°F | Ht 64.0 in | Wt 183.0 lb

## 2023-09-24 DIAGNOSIS — E119 Type 2 diabetes mellitus without complications: Secondary | ICD-10-CM | POA: Diagnosis not present

## 2023-09-24 DIAGNOSIS — I10 Essential (primary) hypertension: Secondary | ICD-10-CM | POA: Diagnosis not present

## 2023-09-24 DIAGNOSIS — E785 Hyperlipidemia, unspecified: Secondary | ICD-10-CM

## 2023-09-24 DIAGNOSIS — R569 Unspecified convulsions: Secondary | ICD-10-CM | POA: Diagnosis not present

## 2023-09-24 DIAGNOSIS — K047 Periapical abscess without sinus: Secondary | ICD-10-CM

## 2023-09-24 DIAGNOSIS — E559 Vitamin D deficiency, unspecified: Secondary | ICD-10-CM

## 2023-09-24 LAB — LIPID PANEL

## 2023-09-24 LAB — BAYER DCA HB A1C WAIVED: HB A1C (BAYER DCA - WAIVED): 6.9 % — ABNORMAL HIGH (ref 4.8–5.6)

## 2023-09-24 MED ORDER — PHENYTOIN SODIUM EXTENDED 100 MG PO CAPS: 300.0000 mg | ORAL_CAPSULE | Freq: Every day | ORAL | 1 refills | Status: DC

## 2023-09-24 MED ORDER — AMLODIPINE BESYLATE 10 MG PO TABS: 10.0000 mg | ORAL_TABLET | Freq: Every day | ORAL | 1 refills | Status: DC

## 2023-09-24 MED ORDER — CLINDAMYCIN HCL 300 MG PO CAPS: 300.0000 mg | ORAL_CAPSULE | Freq: Three times a day (TID) | ORAL | 0 refills | Status: DC

## 2023-09-24 MED ORDER — ATORVASTATIN CALCIUM 40 MG PO TABS: 40.0000 mg | ORAL_TABLET | Freq: Every day | ORAL | 1 refills | Status: DC

## 2023-09-24 MED ORDER — LOSARTAN POTASSIUM 100 MG PO TABS: 100.0000 mg | ORAL_TABLET | Freq: Every day | ORAL | 1 refills | Status: DC

## 2023-09-24 MED ORDER — VITAMIN D (ERGOCALCIFEROL) 1.25 MG (50000 UNIT) PO CAPS: 50000.0000 [IU] | ORAL_CAPSULE | ORAL | 1 refills | Status: DC

## 2023-09-24 NOTE — Progress Notes (Signed)
 Subjective:    Patient ID: Melanie Maynard, female    DOB: [birth year], 63 y.o.   MRN: [id number]   Chief Complaint: medical management of chronic issues     HPI:  Melanie Maynard is a 63 y.o. who identifies as a female who was assigned female at birth.   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 Essential hypertension No c/o chest pain, sob or headache. Does not check blood pressure at home. BP Readings from Last 3 Encounters:  06/17/23 127/81  01/28/23 131/72  12/23/22 136/78     2. Hyperlipidemia with target LDL less than 100 Does not watch diet and does no dedicated exercise. Lab Results  Component Value Date   CHOL 147 06/17/2023   HDL 51 06/17/2023   LDLCALC 77 06/17/2023   TRIG 103 06/17/2023   CHOLHDL 2.9 06/17/2023   The 10-year ASCVD risk score (Arnett DK, et al., 2019) is: 26.4%   3. Type 2 diabetes mellitus treated without insulin (HCC) She does not check her blood sugars at home. Lab Results  Component Value Date   HGBA1C 7.1 (H) 06/17/2023     4. Seizures (HCC) No recurrent seizure activity  5. Vitamin D  deficiency Is suppose to be on vitamin d  supplement  6. Morbid obesity (HCC) Weight is down 3  lbs  Wt Readings from Last 3 Encounters:  09/24/23 183 lb (83 kg)  06/17/23 186 lb (84.4 kg)  01/28/23 178 lb (80.7 kg)   BMI Readings from Last 3 Encounters:  09/24/23 31.41 kg/m  06/17/23 31.93 kg/m  01/28/23 30.55 kg/m         New complaints: Oral abscess- right lower jaw  Allergies  Allergen Reactions   Gabapentin  Other (See Comments)    Chest pain   Outpatient Encounter Medications as of 09/24/2023  Medication Sig   Albuterol-Budesonide (AIRSUPRA ) 90-80 MCG/ACT AERO Inhale 1 Inhaler into the lungs at bedtime as needed.   amLODipine  (NORVASC ) 10 MG tablet Take 1 tablet (10 mg total) by  mouth daily.   atorvastatin  (LIPITOR) 40 MG tablet Take 1 tablet by mouth once daily   losartan  (COZAAR ) 100 MG tablet Take 1 tablet (100 mg total) by mouth daily.   phenytoin  (DILANTIN ) 100 MG ER capsule TAKE 3 CAPSULES BY MOUTH AT BEDTIME   promethazine -dextromethorphan (PROMETHAZINE -DM) 6.25-15 MG/5ML syrup Take 5 mLs by mouth 4 (four) times daily as needed for cough. (Patient not taking: Reported on 06/17/2023)   Vitamin D , Ergocalciferol , (DRISDOL ) 1.25 MG (50000 UNIT) CAPS capsule Take 1 capsule by mouth once a week   No facility-administered encounter medications on file as of 09/24/2023.    Past Surgical History:  Procedure Laterality Date   ABDOMINAL HYSTERECTOMY     APPENDECTOMY     CATARACT EXTRACTION W/PHACO Right 09/26/2016   Procedure: CATARACT EXTRACTION PHACO AND INTRAOCULAR LENS PLACEMENT (IOC);  Surgeon: Harrie Agent, MD;  Location: [facility];  Service: Ophthalmology;  Laterality: Right;  CDE: 3.16   CHOLECYSTECTOMY      Family History  Problem Relation Age of Onset   Diabetes Mother    Heart attack Mother 35   Healthy Father    Alcohol abuse Father    Diabetes Sister    Cancer Sister        breast   Cirrhosis Brother    Leukemia Sister  Diabetes Sister    Kidney disease Sister        on dialysis      Controlled substance contract: n/a     Review of Systems  Constitutional:  Negative for diaphoresis.  Eyes:  Negative for pain.  Respiratory:  Negative for shortness of breath.   Cardiovascular:  Negative for chest pain, palpitations and leg swelling.  Gastrointestinal:  Negative for abdominal pain.  Endocrine: Negative for polydipsia.  Skin:  Negative for rash.  Neurological:  Negative for dizziness, weakness and headaches.  Hematological:  Does not bruise/bleed easily.  All other systems reviewed and are negative.      Objective:   Physical Exam Vitals and nursing note reviewed.  Constitutional:      General: She is not in acute distress.     Appearance: Normal appearance. She is well-developed.  HENT:     Head: Normocephalic.     Right Ear: Tympanic membrane normal.     Left Ear: Tympanic membrane normal.     Nose: Nose normal.     Mouth/Throat:     Mouth: Mucous membranes are moist.     Comments: Right lower gum edema Eyes:     Pupils: Pupils are equal, round, and reactive to light.  Neck:     Vascular: No carotid bruit or JVD.  Cardiovascular:     Rate and Rhythm: Normal rate and regular rhythm.     Heart sounds: Normal heart sounds.  Pulmonary:     Effort: Pulmonary effort is normal. No respiratory distress.     Breath sounds: Normal breath sounds. No wheezing or rales.  Chest:     Chest wall: No tenderness.  Abdominal:     General: Bowel sounds are normal. There is no distension or abdominal bruit.     Palpations: Abdomen is soft. There is no hepatomegaly, splenomegaly, mass or pulsatile mass.     Tenderness: There is no abdominal tenderness.  Musculoskeletal:        General: Normal range of motion.     Cervical back: Normal range of motion and neck supple.  Lymphadenopathy:     Cervical: No cervical adenopathy.  Skin:    General: Skin is warm and dry.  Neurological:     Mental Status: She is alert and oriented to person, place, and time.     Deep Tendon Reflexes: Reflexes are normal and symmetric.  Psychiatric:        Behavior: Behavior normal.        Thought Content: Thought content normal.        Judgment: Judgment normal.   LMP 01/23/2001 (Approximate)    BP (!) 143/94   Pulse 83   Temp (!) 97.5 F (36.4 C) (Temporal)   Ht 5' 4 (1.626 m)   Wt 183 lb (83 kg)   LMP 01/23/2001 (Approximate)   SpO2 90%   BMI 31.41 kg/m     Hgab1c 6.9%     Assessment & Plan:  Melanie Maynard comes in today with chief complaint of medical management of chronic issues    Diagnosis and orders addressed:  1. Essential hypertension Low sodium diet - CBC with Differential/Platelet - CMP14+EGFR - amLODipine   (NORVASC ) 10 MG tablet; Take 1 tablet (10 mg total) by mouth daily.  Dispense: 30 tablet; Refill: 1 - losartan  (COZAAR ) 100 MG tablet; Take 1 tablet (100 mg total) by mouth daily.  Dispense: 90 tablet; Refill: 1  2. Hyperlipidemia with target LDL less than 100 Low fat  diet - Lipid panel - atorvastatin  (LIPITOR) 40 MG tablet; Take 1 tablet (40 mg total) by mouth daily.  Dispense: 90 tablet; Refill: 1  3. Type 2 diabetes mellitus treated without insulin (HCC) Continue to watch carbs in diet- little stricter on diet Will schedule appt with DR. Drake for foot care and nail care - Bayer DCA Hb A1c Waived  4. Seizures (HCC)  - phenytoin  (DILANTIN ) 100 MG ER capsule; Take 3 capsules (300 mg total) by mouth at bedtime.  Dispense: 270 capsule; Refill: 1  5. Vitamin D  deficiency Continue vitamin d  supplement - Vitamin D , Ergocalciferol , (DRISDOL ) 1.25 MG (50000 UNIT) CAPS capsule; Take 1 capsule (50,000 Units total) by mouth every 7 (seven) days.  Dispense: 12 capsule; Refill: 1  6. Morbid obesity (HCC) Discussed diet and exercise for person with BMI >25 Will recheck weight in 3-6 months    Labs pending Health Maintenance reviewed Diet and exercise encouraged  Follow up plan: 3 months   Mary-Margaret Gladis, FNP

## 2023-09-24 NOTE — Patient Instructions (Signed)

## 2023-09-25 LAB — LIPID PANEL
Chol/HDL Ratio: 3.1 ratio (ref 0.0–4.4)
Cholesterol, Total: 143 mg/dL (ref 100–199)
HDL: 46 mg/dL (ref >39)
LDL Chol Calc (NIH): 82 mg/dL (ref 0–99)
Triglycerides: 79 mg/dL (ref 0–149)
VLDL Cholesterol Cal: 15 mg/dL (ref 5–40)

## 2023-09-25 LAB — CMP14+EGFR
ALT: 9 IU/L (ref 0–32)
AST: 14 IU/L (ref 0–40)
Albumin: 3.9 g/dL (ref 3.9–4.9)
Alkaline Phosphatase: 234 IU/L — ABNORMAL HIGH (ref 44–121)
BUN/Creatinine Ratio: 14 (ref 12–28)
BUN: 13 mg/dL (ref 8–27)
Bilirubin Total: 0.3 mg/dL (ref 0.0–1.2)
CO2: 21 mmol/L (ref 20–29)
Calcium: 8.7 mg/dL (ref 8.7–10.3)
Chloride: 105 mmol/L (ref 96–106)
Creatinine, Ser: 0.92 mg/dL (ref 0.57–1.00)
Globulin, Total: 2.7 g/dL (ref 1.5–4.5)
Glucose: 137 mg/dL — ABNORMAL HIGH (ref 70–99)
Potassium: 3.9 mmol/L (ref 3.5–5.2)
Sodium: 142 mmol/L (ref 134–144)
Total Protein: 6.6 g/dL (ref 6.0–8.5)
eGFR: 70 mL/min/1.73 (ref >59)

## 2023-09-25 LAB — CBC WITH DIFFERENTIAL/PLATELET
Basophils Absolute: 0.1 x10E3/uL (ref 0.0–0.2)
Basos: 1 %
EOS (ABSOLUTE): 0.5 x10E3/uL — ABNORMAL HIGH (ref 0.0–0.4)
Eos: 5 %
Hematocrit: 39.5 % (ref 34.0–46.6)
Hemoglobin: 12.8 g/dL (ref 11.1–15.9)
Immature Grans (Abs): 0 x10E3/uL (ref 0.0–0.1)
Immature Granulocytes: 0 %
Lymphocytes Absolute: 2.6 x10E3/uL (ref 0.7–3.1)
Lymphs: 29 %
MCH: 26.4 pg — ABNORMAL LOW (ref 26.6–33.0)
MCHC: 32.4 g/dL (ref 31.5–35.7)
MCV: 81 fL (ref 79–97)
Monocytes Absolute: 0.4 x10E3/uL (ref 0.1–0.9)
Monocytes: 5 %
Neutrophils Absolute: 5.4 x10E3/uL (ref 1.4–7.0)
Neutrophils: 60 %
Platelets: 257 x10E3/uL (ref 150–450)
RBC: 4.85 x10E6/uL (ref 3.77–5.28)
RDW: 14.5 % (ref 11.7–15.4)
WBC: 9 x10E3/uL (ref 3.4–10.8)

## 2023-11-24 ENCOUNTER — Telehealth: Payer: Self-pay

## 2023-11-24 DIAGNOSIS — I10 Essential (primary) hypertension: Secondary | ICD-10-CM

## 2023-11-24 DIAGNOSIS — E559 Vitamin D deficiency, unspecified: Secondary | ICD-10-CM

## 2023-11-24 DIAGNOSIS — E785 Hyperlipidemia, unspecified: Secondary | ICD-10-CM

## 2023-11-24 DIAGNOSIS — R053 Chronic cough: Secondary | ICD-10-CM

## 2023-11-24 DIAGNOSIS — R569 Unspecified convulsions: Secondary | ICD-10-CM

## 2023-11-24 MED ORDER — VITAMIN D (ERGOCALCIFEROL) 1.25 MG (50000 UNIT) PO CAPS: 50000.0000 [IU] | ORAL_CAPSULE | ORAL | 1 refills | Status: DC

## 2023-11-24 MED ORDER — LOSARTAN POTASSIUM 100 MG PO TABS: 100.0000 mg | ORAL_TABLET | Freq: Every day | ORAL | 1 refills | Status: DC

## 2023-11-24 MED ORDER — ATORVASTATIN CALCIUM 40 MG PO TABS: 40.0000 mg | ORAL_TABLET | Freq: Every day | ORAL | 1 refills | Status: DC

## 2023-11-24 MED ORDER — AMLODIPINE BESYLATE 10 MG PO TABS: 10.0000 mg | ORAL_TABLET | Freq: Every day | ORAL | 1 refills | Status: DC

## 2023-11-24 MED ORDER — PHENYTOIN SODIUM EXTENDED 100 MG PO CAPS
300.0000 mg | ORAL_CAPSULE | Freq: Every day | ORAL | 1 refills | Status: AC
Start: 2023-11-24 — End: ?

## 2023-11-24 NOTE — Telephone Encounter (Signed)
 All meds sent to [facility] per patients request. Patient states that insurance doesn't cover meds from [facility] and needed them switched. Patient verbalized understanding

## 2023-11-24 NOTE — Telephone Encounter (Signed)
 Copied from CRM #[id number]. Topic: Clinical - Request for Lab/Test Order >> Nov 24, 2023  3:28 PM Melanie Maynard wrote: Reason for CRM: Patient would like all of her current meds to be sent to [facility] [address] [place], [place] [address] [phone] in the future and to remove [facility] from file

## 2023-11-24 NOTE — Addendum Note (Signed)
 Addended by: Fronnie Urton G on: 11/24/2023 04:33 PM   Modules accepted: Orders

## 2024-03-08 ENCOUNTER — Encounter: Payer: Self-pay | Admitting: Family Medicine

## 2024-03-08 ENCOUNTER — Other Ambulatory Visit: Payer: Self-pay | Admitting: Family Medicine

## 2024-03-08 ENCOUNTER — Ambulatory Visit: Payer: Self-pay

## 2024-03-08 ENCOUNTER — Ambulatory Visit: Admitting: Family Medicine

## 2024-03-08 VITALS — BP 152/93 | HR 92 | Temp 98.4°F | Ht 64.0 in | Wt 184.2 lb

## 2024-03-08 DIAGNOSIS — M545 Low back pain, unspecified: Secondary | ICD-10-CM

## 2024-03-08 DIAGNOSIS — R053 Chronic cough: Secondary | ICD-10-CM

## 2024-03-08 MED ORDER — KETOROLAC TROMETHAMINE 30 MG/ML IJ SOLN
30.0000 mg | Freq: Once | INTRAMUSCULAR | Status: AC
  Administered 2024-03-08: 15:00:00 30 mg via INTRAMUSCULAR

## 2024-03-08 MED ORDER — PREDNISONE 10 MG (21) PO TBPK: ORAL_TABLET | ORAL | 0 refills | Status: DC

## 2024-03-08 MED ORDER — AIRSUPRA 90-80 MCG/ACT IN AERO: 1.0000 | INHALATION_SPRAY | Freq: Every evening | RESPIRATORY_TRACT | 0 refills | Status: DC | PRN

## 2024-03-08 NOTE — Addendum Note (Signed)
 Addended by: OLENA RAISIN C on: 03/08/2024 03:24 PM   Modules accepted: Orders

## 2024-03-08 NOTE — Progress Notes (Signed)
 Subjective:  Patient ID: Melanie Maynard, female    DOB: Feb 13, 1961, 63 y.o.   MRN: [id number]  Patient Care Team: Gladis Mustard, FNP as PCP - General (Nurse Practitioner) Vicci Mcardle, OD (Optometry)   Chief Complaint:  Back Pain (Right lower back pain x 4-5 days ) and Cough (X 2-3 weeks )   HPI: [name] is a 63 y.o. female presenting on 03/08/2024 for Back Pain (Right lower back pain x 4-5 days ) and Cough (X 2-3 weeks )    [name] is a 63 year old female who presents with lower back pain and a persistent cough.  She has been experiencing lower back pain for the past three to four days, localized to the lower back and exacerbated by bending over. There is no history of recent injuries, although she mentions a fall some time ago without significant injury. She has been using ibuprofen , which provided some relief, allowing her to work the following day. However, the pain returned, causing her to leave work early on subsequent days. The pain is described as being on the right side, with no radiation, loss of bowel or bladder function, or numbness and tingling in the genital area.  She also has a persistent cough for about three weeks, initially dry and now sometimes productive with phlegm. The cough was severe enough to keep her out of work for about a week. She has been self-medicating with various over-the-counter cold remedies and fluids. She denies using an inhaler or Mucinex and she does not know where her inhaler for her persistent cough is located.          Relevant past medical, surgical, family, and social history reviewed and updated as indicated.  Allergies and medications reviewed and updated. Data reviewed: Chart in Epic.   Past Medical History:  Diagnosis Date   Diabetes mellitus without complication (HCC)    Hypertension    Seizures (HCC)     Past Surgical History:  Procedure Laterality Date   ABDOMINAL HYSTERECTOMY     APPENDECTOMY      CATARACT EXTRACTION W/PHACO Right 09/26/2016   Procedure: CATARACT EXTRACTION PHACO AND INTRAOCULAR LENS PLACEMENT (IOC);  Surgeon: Harrie Agent, MD;  Location: [facility];  Service: Ophthalmology;  Laterality: Right;  CDE: 3.16   CHOLECYSTECTOMY      Social History   Socioeconomic History   Marital status: Significant Other    Spouse name: Not on file   Number of children: 0   Years of education: 12   Highest education level: 12th grade  Occupational History    Employer: [employer]  Tobacco Use   Smoking status: Every Day    Current packs/day: 1.00    Types: Cigarettes   Smokeless tobacco: Never  Vaping Use   Vaping status: Never Used  Substance and Sexual Activity   Alcohol use: No    Alcohol/week: 0.0 standard drinks of alcohol   Drug use: No   Sexual activity: Not on file  Other Topics Concern   Not on file  Social History Narrative   Patient is single with no children   Patient is right handed   Patient has a high school education   Patient drinks 2-3 cups daily   Social Drivers of Health   Tobacco Use: High Risk (03/08/2024)   Patient History    Smoking Tobacco Use: Every Day    Smokeless Tobacco Use: Never    Passive Exposure: Not on file  Financial  Resource Strain: Medium Risk (06/16/2023)   Overall Financial Resource Strain (CARDIA)    Difficulty of Paying Living Expenses: Somewhat hard  Food Insecurity: Patient Declined (06/16/2023)   Hunger Vital Sign    Worried About Running Out of Food in the Last Year: Patient declined    Ran Out of Food in the Last Year: Patient declined  Transportation Needs: No Transportation Needs (06/16/2023)   PRAPARE - Administrator, Civil Service (Medical): No    Lack of Transportation (Non-Medical): No  Physical Activity: Unknown (06/16/2023)   Exercise Vital Sign    Days of Exercise per Week: Patient declined    Minutes of Exercise per Session: Not on file  Stress: No Stress Concern Present (06/16/2023)    [employer] of Occupational Health - Occupational Stress Questionnaire    Feeling of Stress : Not at all  Social Connections: Unknown (06/16/2023)   Social Connection and Isolation Panel    Frequency of Communication with Friends and Family: More than three times a week    Frequency of Social Gatherings with Friends and Family: Twice a week    Attends Religious Services: Patient declined    Database Administrator or Organizations: Yes    Attends Banker Meetings: Patient declined    Marital Status: Living with partner  Intimate Partner Violence: Not on file  Depression (PHQ2-9): Low Risk (09/24/2023)   Depression (PHQ2-9)    PHQ-2 Score: 0  Alcohol Screen: Not on file  Housing: Low Risk (06/16/2023)   Housing Stability Vital Sign    Unable to Pay for Housing in the Last Year: No    Number of Times Moved in the Last Year: 0    Homeless in the Last Year: No  Utilities: Not on file  Health Literacy: Low Risk (08/05/2021)   Received from [facility]    How often do you need to have someone help you when you read instructions, pamphlets, or other written material from your doctor or pharmacy?: Never    Outpatient Encounter Medications as of 03/08/2024  Medication Sig   amLODipine  (NORVASC ) 10 MG tablet Take 1 tablet (10 mg total) by mouth daily.   atorvastatin  (LIPITOR) 40 MG tablet Take 1 tablet (40 mg total) by mouth daily.   losartan  (COZAAR ) 100 MG tablet Take 1 tablet (100 mg total) by mouth daily.   phenytoin  (DILANTIN ) 100 MG ER capsule Take 3 capsules (300 mg total) by mouth at bedtime.   predniSONE  (STERAPRED UNI-PAK 21 TAB) 10 MG (21) TBPK tablet Use as directed on back of pill pack   Vitamin D , Ergocalciferol , (DRISDOL ) 1.25 MG (50000 UNIT) CAPS capsule Take 1 capsule (50,000 Units total) by mouth once a week.   [DISCONTINUED] Albuterol-Budesonide (AIRSUPRA ) 90-80 MCG/ACT AERO Inhale 1 Inhaler into the lungs at bedtime as needed.    Albuterol-Budesonide (AIRSUPRA ) 90-80 MCG/ACT AERO Inhale 1 Inhaler into the lungs at bedtime as needed.   [DISCONTINUED] clindamycin  (CLEOCIN ) 300 MG capsule Take 1 capsule (300 mg total) by mouth 3 (three) times daily.   No facility-administered encounter medications on file as of 03/08/2024.    Allergies[1]  Pertinent ROS per HPI, otherwise unremarkable      Objective:  BP (!) 152/93   Pulse 92   Temp 98.4 F (36.9 C)   Ht 5' 4 (1.626 m)   Wt 184 lb 3.2 oz (83.6 kg)   LMP 01/23/2001   SpO2 95%   BMI 31.62 kg/m  Wt Readings from Last 3 Encounters:  03/08/24 184 lb 3.2 oz (83.6 kg)  09/24/23 183 lb (83 kg)  06/17/23 186 lb (84.4 kg)    Physical Exam Vitals and nursing note reviewed.  Constitutional:      General: She is not in acute distress.    Appearance: Normal appearance. She is well-developed and well-groomed. She is not ill-appearing, toxic-appearing or diaphoretic.  HENT:     Head: Normocephalic and atraumatic.     Jaw: There is normal jaw occlusion.     Right Ear: Hearing, tympanic membrane, ear canal and external ear normal.     Left Ear: Hearing, tympanic membrane, ear canal and external ear normal.     Nose: Nose normal.     Mouth/Throat:     Lips: Pink.     Mouth: Mucous membranes are moist.     Pharynx: Oropharynx is clear. Uvula midline.  Eyes:     General: Lids are normal.     Extraocular Movements: Extraocular movements intact.     Conjunctiva/sclera: Conjunctivae normal.     Pupils: Pupils are equal, round, and reactive to light.  Neck:     Thyroid : No thyroid  mass, thyromegaly or thyroid  tenderness.     Vascular: No carotid bruit or JVD.     Trachea: Trachea and phonation normal.  Cardiovascular:     Rate and Rhythm: Normal rate and regular rhythm.     Chest Wall: PMI is not displaced.     Pulses: Normal pulses.     Heart sounds: Normal heart sounds. No murmur heard.    No friction rub. No gallop.  Pulmonary:     Effort: Pulmonary  effort is normal. No respiratory distress.     Breath sounds: Normal breath sounds. No wheezing.  Abdominal:     General: Bowel sounds are normal. There is no distension or abdominal bruit.     Palpations: Abdomen is soft. There is no hepatomegaly or splenomegaly.     Tenderness: There is no abdominal tenderness. There is no right CVA tenderness or left CVA tenderness.     Hernia: No hernia is present.  Musculoskeletal:     Cervical back: Normal range of motion and neck supple.     Thoracic back: Normal.     Lumbar back: Tenderness present. No swelling, edema, deformity, signs of trauma, lacerations, spasms or bony tenderness. Decreased range of motion. Negative right straight leg raise test and negative left straight leg raise test. No scoliosis.     Right hip: Normal.     Left hip: Normal.     Right lower leg: No edema.     Left lower leg: No edema.  Lymphadenopathy:     Cervical: No cervical adenopathy.  Skin:    General: Skin is warm and dry.     Capillary Refill: Capillary refill takes less than 2 seconds.     Coloration: Skin is not cyanotic, jaundiced or pale.     Findings: No rash.  Neurological:     General: No focal deficit present.     Mental Status: She is alert and oriented to person, place, and time.     Sensory: Sensation is intact.     Motor: Motor function is intact.     Coordination: Coordination is intact.     Gait: Gait is intact.     Deep Tendon Reflexes: Reflexes are normal and symmetric.  Psychiatric:        Attention and Perception: Attention and perception normal.  Mood and Affect: Mood and affect normal.        Speech: Speech normal.        Behavior: Behavior normal. Behavior is cooperative.        Thought Content: Thought content normal.        Cognition and Memory: Cognition and memory normal.        Judgment: Judgment normal.      Results for orders placed or performed in visit on 09/24/23  Bayer DCA Hb A1c Waived   Collection Time:  09/24/23 10:34 AM  Result Value Ref Range   HB A1C (BAYER DCA - WAIVED) 6.9 (H) 4.8 - 5.6 %  CBC with Differential/Platelet   Collection Time: 09/24/23 10:40 AM  Result Value Ref Range   WBC 9.0 3.4 - 10.8 x10E3/uL   RBC 4.85 3.77 - 5.28 x10E6/uL   Hemoglobin 12.8 11.1 - 15.9 g/dL   Hematocrit 60.4 65.9 - 46.6 %   MCV 81 79 - 97 fL   MCH 26.4 (L) 26.6 - 33.0 pg   MCHC 32.4 31.5 - 35.7 g/dL   RDW 85.4 88.2 - 84.5 %   Platelets 257 150 - 450 x10E3/uL   Neutrophils 60 Not Estab. %   Lymphs 29 Not Estab. %   Monocytes 5 Not Estab. %   Eos 5 Not Estab. %   Basos 1 Not Estab. %   Neutrophils Absolute 5.4 1.4 - 7.0 x10E3/uL   Lymphocytes Absolute 2.6 0.7 - 3.1 x10E3/uL   Monocytes Absolute 0.4 0.1 - 0.9 x10E3/uL   EOS (ABSOLUTE) 0.5 (H) 0.0 - 0.4 x10E3/uL   Basophils Absolute 0.1 0.0 - 0.2 x10E3/uL   Immature Granulocytes 0 Not Estab. %   Immature Grans (Abs) 0.0 0.0 - 0.1 x10E3/uL  CMP14+EGFR   Collection Time: 09/24/23 10:40 AM  Result Value Ref Range   Glucose 137 (H) 70 - 99 mg/dL   BUN 13 8 - 27 mg/dL   Creatinine, Ser 9.07 0.57 - 1.00 mg/dL   eGFR 70 >40 fO/fpw/8.26   BUN/Creatinine Ratio 14 12 - 28   Sodium 142 134 - 144 mmol/L   Potassium 3.9 3.5 - 5.2 mmol/L   Chloride 105 96 - 106 mmol/L   CO2 21 20 - 29 mmol/L   Calcium  8.7 8.7 - 10.3 mg/dL   Total Protein 6.6 6.0 - 8.5 g/dL   Albumin 3.9 3.9 - 4.9 g/dL   Globulin, Total 2.7 1.5 - 4.5 g/dL   Bilirubin Total 0.3 0.0 - 1.2 mg/dL   Alkaline Phosphatase 234 (H) 44 - 121 IU/L   AST 14 0 - 40 IU/L   ALT 9 0 - 32 IU/L  Lipid panel   Collection Time: 09/24/23 10:40 AM  Result Value Ref Range   Cholesterol, Total 143 100 - 199 mg/dL   Triglycerides 79 0 - 149 mg/dL   HDL 46 >60 mg/dL   VLDL Cholesterol Cal 15 5 - 40 mg/dL   LDL Chol Calc (NIH) 82 0 - 99 mg/dL   Chol/HDL Ratio 3.1 0.0 - 4.4 ratio       Pertinent labs & imaging results that were available during my care of the patient were reviewed by me and  considered in my medical decision making.  Assessment & Plan:  Seba was seen today for back pain and cough.  Diagnoses and all orders for this visit:  Acute right-sided low back pain without sciatica -     predniSONE  (STERAPRED UNI-PAK 21 TAB) 10 MG (21)  TBPK tablet; Use as directed on back of pill pack  Persistent cough -     Albuterol-Budesonide (AIRSUPRA ) 90-80 MCG/ACT AERO; Inhale 1 Inhaler into the lungs at bedtime as needed.      Acute right-sided low back pain 4-5 days, exacerbated by bending over. No bowel or bladder dysfunction, radicular pain, or saddle anesthesia. Ibuprofen  provided some relief. NSAIDs are contraindicated due to hypertension and potential renal effects. Steroids are preferred for anti-inflammatory effects without affecting blood pressure or renal function. - Administered Toradol  injection in the office today. - Initiate oral steroids tomorrow morning.  Persistent cough 3 weeks, initially dry, now productive with intermittent phlegm. No prior use of prescribed Airsupra  inhaler. Lungs are clear on examination. Airsupra  inhaler is indicated to dilate bronchioles and facilitate mucus clearance. - Prescribed Airsupra  inhaler and sent prescription to pharmacy. - Recommended over-the-counter Mucinex for mucus clearance.          Continue all other maintenance medications.  Follow up plan: Return if symptoms worsen or fail to improve.   Continue healthy lifestyle choices, including diet (rich in fruits, vegetables, and lean proteins, and low in salt and simple carbohydrates) and exercise (at least 30 minutes of moderate physical activity daily).  Educational handout given for back pain  The above assessment and management plan was discussed with the patient. The patient verbalized understanding of and has agreed to the management plan. Patient is aware to call the clinic if they develop any new symptoms or if symptoms persist or worsen. Patient is aware  when to return to the clinic for a follow-up visit. Patient educated on when it is appropriate to go to the emergency department.   Rosaline Bruns, FNP-C [facility] [phone]     [1]  Allergies Allergen Reactions   Gabapentin  Other (See Comments)    Chest pain

## 2024-03-08 NOTE — Telephone Encounter (Signed)
 FYI Only or Action Required?: FYI only for provider: appointment scheduled on 03/08/2024.  Patient was last seen in primary care on 09/24/2023 by Melanie Mustard, FNP.  Called Nurse Triage reporting Cough.  Symptoms began a week ago.  Interventions attempted: OTC medications: cold and flu medicine and Prescription medications: a friends ibuprofen  800mg .  Symptoms are: unchanged.  Triage Disposition: See PCP When Office is Open (Within 3 Days)  Patient/caregiver understands and will follow disposition?: Yes  Copied from CRM #[id number]. Topic: Clinical - Red Word Triage >> Mar 08, 2024 10:53 AM Antwanette L wrote: Red Word that prompted transfer to Nurse Triage: Patient reports several days of back pain along with nausea, dry cough, and coughing up  yellow phlegm Reason for Disposition  Cough has been present for > 3 weeks  Answer Assessment - Initial Assessment Questions Had a cold that she took a week off work for. Lingering cough. Back pain in lower back. Ibuprofen . Back pain worse when she bends over. 1. ONSET: When did the cough begin?      Several days 3. SPUTUM: Describe the color of your sputum (e.g., none, dry cough; clear, white, yellow, green)     yellow 4. HEMOPTYSIS: Are you coughing up any blood? If Yes, ask: How much? (e.g., flecks, streaks, tablespoons, etc.)     denies 5. DIFFICULTY BREATHING: Are you having difficulty breathing? If Yes, ask: How bad is it? (e.g., mild, moderate, severe)      Denies 6. FEVER: Do you have a fever? If Yes, ask: What is your temperature, how was it measured, and when did it start?     denies 7. CARDIAC HISTORY: Do you have any history of heart disease? (e.g., heart attack, congestive heart failure)      HTN 8. LUNG HISTORY: Do you have any history of lung disease?  (e.g., pulmonary embolus, asthma, emphysema)     Denies 9. PE RISK FACTORS: Do you have a history of blood clots? (or: recent major surgery,  recent prolonged travel, bedridden)     Denies 10. OTHER SYMPTOMS: Do you have any other symptoms? (e.g., runny nose, wheezing, chest pain)       Nausea 12. TRAVEL: Have you traveled out of the country in the last month? (e.g., travel history, exposures)       denies Denies pain when urinating.  Protocols used: Cough - Acute Productive-A-AH

## 2024-03-08 NOTE — Telephone Encounter (Signed)
 Pt has appt

## 2024-03-09 ENCOUNTER — Other Ambulatory Visit: Payer: Self-pay | Admitting: Nurse Practitioner

## 2024-03-09 DIAGNOSIS — R053 Chronic cough: Secondary | ICD-10-CM

## 2024-03-09 NOTE — Telephone Encounter (Signed)
 Name from pharmacy: AIRSUPRA  90-80 MCG INHALER   Pharmacy comment: Alternative Requested:NOT COVERED BY INSURANCE

## 2024-03-09 NOTE — Telephone Encounter (Signed)
 Name from pharmacy: [facility]  [address] [facility]  Pharmacy comment: Alternative Requested:NON FORMULARY DRUG; PLEASE CONTACT INSURANCE OR CONSIDER ALTERNATIVE

## 2024-03-10 ENCOUNTER — Other Ambulatory Visit: Payer: Self-pay | Admitting: Nurse Practitioner

## 2024-03-10 DIAGNOSIS — R053 Chronic cough: Secondary | ICD-10-CM

## 2024-03-11 NOTE — Telephone Encounter (Signed)
 Name from pharmacy: [facility]  Pharmacy comment: Alternative Requested:NOT COVERED BY INSURANCE

## 2024-03-25 ENCOUNTER — Encounter: Payer: Self-pay | Admitting: Nurse Practitioner

## 2024-03-25 ENCOUNTER — Ambulatory Visit: Payer: Self-pay | Admitting: Nurse Practitioner

## 2024-03-25 VITALS — BP 137/74 | HR 70 | Temp 98.1°F | Ht 64.0 in | Wt 183.0 lb

## 2024-03-25 DIAGNOSIS — E785 Hyperlipidemia, unspecified: Secondary | ICD-10-CM | POA: Diagnosis not present

## 2024-03-25 DIAGNOSIS — R569 Unspecified convulsions: Secondary | ICD-10-CM

## 2024-03-25 DIAGNOSIS — E559 Vitamin D deficiency, unspecified: Secondary | ICD-10-CM | POA: Diagnosis not present

## 2024-03-25 DIAGNOSIS — E119 Type 2 diabetes mellitus without complications: Secondary | ICD-10-CM | POA: Diagnosis not present

## 2024-03-25 DIAGNOSIS — E1141 Type 2 diabetes mellitus with diabetic mononeuropathy: Secondary | ICD-10-CM

## 2024-03-25 DIAGNOSIS — I10 Essential (primary) hypertension: Secondary | ICD-10-CM | POA: Diagnosis not present

## 2024-03-25 LAB — BAYER DCA HB A1C WAIVED: HB A1C (BAYER DCA - WAIVED): 6.7 % — ABNORMAL HIGH (ref 4.8–5.6)

## 2024-03-25 MED ORDER — VITAMIN D (ERGOCALCIFEROL) 1.25 MG (50000 UNIT) PO CAPS: 50000.0000 [IU] | ORAL_CAPSULE | ORAL | 1 refills | Status: AC

## 2024-03-25 MED ORDER — LOSARTAN POTASSIUM 100 MG PO TABS: 100.0000 mg | ORAL_TABLET | Freq: Every day | ORAL | 1 refills | Status: AC

## 2024-03-25 MED ORDER — ATORVASTATIN CALCIUM 40 MG PO TABS: 40.0000 mg | ORAL_TABLET | Freq: Every day | ORAL | 1 refills | Status: AC

## 2024-03-25 MED ORDER — AMLODIPINE BESYLATE 10 MG PO TABS: 10.0000 mg | ORAL_TABLET | Freq: Every day | ORAL | 1 refills | Status: AC

## 2024-03-25 NOTE — Patient Instructions (Signed)

## 2024-03-25 NOTE — Progress Notes (Signed)
 "  Subjective:    Patient ID: Melanie Maynard, female    DOB: Mar 24, 1961, 64 y.o.   MRN: [id number]   Chief Complaint: medical management of chronic issues     HPI:  Melanie Maynard is a 64 y.o. who identifies as a female who was assigned female at birth.   Social history: Lives with: her boyfriend Work history: retired from [employer] but is working [employer] in today for follow up of the following chronic medical issues:  1. Essential hypertension No c/o chest pain, sob or headache. Does not check blood pressure at home. BP Readings from Last 3 Encounters:  03/25/24 137/74  03/08/24 (!) 152/93  09/24/23 (!) 143/94     2. Hyperlipidemia with target LDL less than 100 Does not watch diet and does no dedicated exercise. Lab Results  Component Value Date   CHOL 143 09/24/2023   HDL 46 09/24/2023   LDLCALC 82 09/24/2023   TRIG 79 09/24/2023   CHOLHDL 3.1 09/24/2023   The 10-year ASCVD risk score (Arnett DK, et al., 2019) is: 31.6%   3. Type 2 diabetes mellitus treated without insulin (HCC) She does not check her blood sugars at home. Lab Results  Component Value Date   HGBA1C 6.9 (H) 09/24/2023     4. Seizures (HCC) No recurrent seizure activity  5. Vitamin D  deficiency Is suppose to be on vitamin d  supplement  6. Morbid obesity (HCC) Weight is down 3  lbs  Wt Readings from Last 3 Encounters:  03/25/24 183 lb (83 kg)  03/08/24 184 lb 3.2 oz (83.6 kg)  09/24/23 183 lb (83 kg)   BMI Readings from Last 3 Encounters:  03/25/24 31.41 kg/m  03/08/24 31.62 kg/m  09/24/23 31.41 kg/m         New complaints: None today  Allergies  Allergen Reactions   Gabapentin  Other (See Comments)    Chest pain   Outpatient Encounter Medications as of 03/25/2024  Medication Sig   amLODipine  (NORVASC ) 10 MG tablet Take 1 tablet (10 mg total) by mouth daily.   atorvastatin  (LIPITOR) 40 MG tablet Take 1 tablet (40 mg total) by mouth daily.   losartan   (COZAAR ) 100 MG tablet Take 1 tablet (100 mg total) by mouth daily.   phenytoin  (DILANTIN ) 100 MG ER capsule Take 3 capsules (300 mg total) by mouth at bedtime.   Vitamin D , Ergocalciferol , (DRISDOL ) 1.25 MG (50000 UNIT) CAPS capsule Take 1 capsule (50,000 Units total) by mouth once a week.   [DISCONTINUED] albuterol (VENTOLIN HFA) 108 (90 Base) MCG/ACT inhaler Inhale 2 puffs into the lungs every 6 (six) hours as needed for wheezing or shortness of breath.   [DISCONTINUED] predniSONE  (STERAPRED UNI-PAK 21 TAB) 10 MG (21) TBPK tablet Use as directed on back of pill pack   No facility-administered encounter medications on file as of 03/25/2024.    Past Surgical History:  Procedure Laterality Date   ABDOMINAL HYSTERECTOMY     APPENDECTOMY     CATARACT EXTRACTION W/PHACO Right 09/26/2016   Procedure: CATARACT EXTRACTION PHACO AND INTRAOCULAR LENS PLACEMENT (IOC);  Surgeon: Harrie Agent, MD;  Location: [facility];  Service: Ophthalmology;  Laterality: Right;  CDE: 3.16   CHOLECYSTECTOMY      Family History  Problem Relation Age of Onset   Diabetes Mother    Heart attack Mother 15   Healthy Father    Alcohol abuse Father    Diabetes Sister    Cancer Sister  breast   Cirrhosis Brother    Leukemia Sister    Diabetes Sister    Kidney disease Sister        on dialysis      Controlled substance contract: n/a     Review of Systems  Constitutional:  Negative for diaphoresis.  Eyes:  Negative for pain.  Respiratory:  Negative for shortness of breath.   Cardiovascular:  Negative for chest pain, palpitations and leg swelling.  Gastrointestinal:  Negative for abdominal pain.  Endocrine: Negative for polydipsia.  Skin:  Negative for rash.  Neurological:  Negative for dizziness, weakness and headaches.  Hematological:  Does not bruise/bleed easily.  All other systems reviewed and are negative.      Objective:   Physical Exam Vitals and nursing note reviewed.  Constitutional:       General: She is not in acute distress.    Appearance: Normal appearance. She is well-developed.  HENT:     Head: Normocephalic.     Right Ear: Tympanic membrane normal.     Left Ear: Tympanic membrane normal.     Nose: Nose normal.     Mouth/Throat:     Mouth: Mucous membranes are moist.     Comments: Right lower gum edema Eyes:     Pupils: Pupils are equal, round, and reactive to light.  Neck:     Vascular: No carotid bruit or JVD.  Cardiovascular:     Rate and Rhythm: Normal rate and regular rhythm.     Heart sounds: Normal heart sounds.  Pulmonary:     Effort: Pulmonary effort is normal. No respiratory distress.     Breath sounds: Normal breath sounds. No wheezing or rales.  Chest:     Chest wall: No tenderness.  Abdominal:     General: Bowel sounds are normal. There is no distension or abdominal bruit.     Palpations: Abdomen is soft. There is no hepatomegaly, splenomegaly, mass or pulsatile mass.     Tenderness: There is no abdominal tenderness.  Musculoskeletal:        General: Normal range of motion.     Cervical back: Normal range of motion and neck supple.  Lymphadenopathy:     Cervical: No cervical adenopathy.  Skin:    General: Skin is warm and dry.  Neurological:     Mental Status: She is alert and oriented to person, place, and time.     Deep Tendon Reflexes: Reflexes are normal and symmetric.  Psychiatric:        Behavior: Behavior normal.        Thought Content: Thought content normal.        Judgment: Judgment normal.   BP 137/74   Pulse 70   Temp 98.1 F (36.7 C) (Temporal)   Ht 5' 4 (1.626 m)   Wt 183 lb (83 kg)   LMP 01/23/2001   SpO2 97%   BMI 31.41 kg/m    Hgab1c 6.7%     Assessment & Plan:  Melanie Maynard comes in today with chief complaint of medical management of chronic issues    Diagnosis and orders addressed:  1. Essential hypertension Low sodium diet - CBC with Differential/Platelet - CMP14+EGFR - amLODipine  (NORVASC )  10 MG tablet; Take 1 tablet (10 mg total) by mouth daily.  Dispense: 30 tablet; Refill: 1 - losartan  (COZAAR ) 100 MG tablet; Take 1 tablet (100 mg total) by mouth daily.  Dispense: 90 tablet; Refill: 1  2. Hyperlipidemia with target LDL less than  100 Low fat diet - Lipid panel - atorvastatin  (LIPITOR) 40 MG tablet; Take 1 tablet (40 mg total) by mouth daily.  Dispense: 90 tablet; Refill: 1  3. Type 2 diabetes mellitus treated without insulin (HCC) Continue to watch carbs in diet- little stricter on diet Will schedule appt with DR. Drake for foot care and nail care - Bayer DCA Hb A1c Waived  4. Seizures (HCC)  - phenytoin  (DILANTIN ) 100 MG ER capsule; Take 3 capsules (300 mg total) by mouth at bedtime.  Dispense: 270 capsule; Refill: 1  5. Vitamin D  deficiency Continue vitamin d  supplement - Vitamin D , Ergocalciferol , (DRISDOL ) 1.25 MG (50000 UNIT) CAPS capsule; Take 1 capsule (50,000 Units total) by mouth every 7 (seven) days.  Dispense: 12 capsule; Refill: 1  6. Morbid obesity (HCC) Discussed diet and exercise for person with BMI >25 Will recheck weight in 3-6 months    Labs pending Health Maintenance reviewed Diet and exercise encouraged  Follow up plan: 3 months   Mary-Margaret Gladis, FNP   "

## 2024-03-26 LAB — CBC WITH DIFFERENTIAL/PLATELET
Basophils Absolute: 0.1 x10E3/uL (ref 0.0–0.2)
Basos: 1 %
EOS (ABSOLUTE): 0.4 x10E3/uL (ref 0.0–0.4)
Eos: 5 %
Hematocrit: 42.8 % (ref 34.0–46.6)
Hemoglobin: 14.3 g/dL (ref 11.1–15.9)
Immature Grans (Abs): 0 x10E3/uL (ref 0.0–0.1)
Immature Granulocytes: 0 %
Lymphocytes Absolute: 2.9 x10E3/uL (ref 0.7–3.1)
Lymphs: 31 %
MCH: 26.6 pg (ref 26.6–33.0)
MCHC: 33.4 g/dL (ref 31.5–35.7)
MCV: 80 fL (ref 79–97)
Monocytes Absolute: 0.5 x10E3/uL (ref 0.1–0.9)
Monocytes: 5 %
Neutrophils Absolute: 5.4 x10E3/uL (ref 1.4–7.0)
Neutrophils: 58 %
Platelets: 299 x10E3/uL (ref 150–450)
RBC: 5.38 x10E6/uL — ABNORMAL HIGH (ref 3.77–5.28)
RDW: 14.3 % (ref 11.7–15.4)
WBC: 9.3 x10E3/uL (ref 3.4–10.8)

## 2024-03-26 LAB — CMP14+EGFR
ALT: 9 IU/L (ref 0–32)
AST: 13 IU/L (ref 0–40)
Albumin: 3.9 g/dL (ref 3.9–4.9)
Alkaline Phosphatase: 253 IU/L — ABNORMAL HIGH (ref 49–135)
BUN/Creatinine Ratio: 17 (ref 12–28)
BUN: 16 mg/dL (ref 8–27)
Bilirubin Total: 0.4 mg/dL (ref 0.0–1.2)
CO2: 25 mmol/L (ref 20–29)
Calcium: 9.4 mg/dL (ref 8.7–10.3)
Chloride: 101 mmol/L (ref 96–106)
Creatinine, Ser: 0.93 mg/dL (ref 0.57–1.00)
Globulin, Total: 3.2 g/dL (ref 1.5–4.5)
Glucose: 136 mg/dL — ABNORMAL HIGH (ref 70–99)
Potassium: 4.8 mmol/L (ref 3.5–5.2)
Sodium: 140 mmol/L (ref 134–144)
Total Protein: 7.1 g/dL (ref 6.0–8.5)
eGFR: 69 mL/min/1.73

## 2024-03-26 LAB — LIPID PANEL
Chol/HDL Ratio: 2.6 ratio (ref 0.0–4.4)
Cholesterol, Total: 152 mg/dL (ref 100–199)
HDL: 59 mg/dL
LDL Chol Calc (NIH): 76 mg/dL (ref 0–99)
Triglycerides: 92 mg/dL (ref 0–149)
VLDL Cholesterol Cal: 17 mg/dL (ref 5–40)

## 2024-09-20 ENCOUNTER — Ambulatory Visit: Admitting: Nurse Practitioner
